# Patient Record
Sex: Male | Born: 1950 | Race: White | Hispanic: No | Smoking: Former smoker
Health system: Southern US, Community
[De-identification: ages and names within clinical notes are randomized; demographics above are authoritative.]

## PROBLEM LIST (undated history)

## (undated) DIAGNOSIS — R519 Headache, unspecified: Secondary | ICD-10-CM

## (undated) DIAGNOSIS — Z720 Tobacco use: Secondary | ICD-10-CM

## (undated) DIAGNOSIS — R7303 Prediabetes: Secondary | ICD-10-CM

## (undated) DIAGNOSIS — J189 Pneumonia, unspecified organism: Secondary | ICD-10-CM

## (undated) DIAGNOSIS — M1990 Unspecified osteoarthritis, unspecified site: Secondary | ICD-10-CM

## (undated) DIAGNOSIS — R0600 Dyspnea, unspecified: Secondary | ICD-10-CM

## (undated) DIAGNOSIS — C3491 Malignant neoplasm of unspecified part of right bronchus or lung: Secondary | ICD-10-CM

## (undated) DIAGNOSIS — R51 Headache: Secondary | ICD-10-CM

## (undated) DIAGNOSIS — S0990XA Unspecified injury of head, initial encounter: Secondary | ICD-10-CM

## (undated) DIAGNOSIS — R918 Other nonspecific abnormal finding of lung field: Secondary | ICD-10-CM

## (undated) DIAGNOSIS — J449 Chronic obstructive pulmonary disease, unspecified: Secondary | ICD-10-CM

## (undated) DIAGNOSIS — T8859XA Other complications of anesthesia, initial encounter: Secondary | ICD-10-CM

## (undated) DIAGNOSIS — N400 Enlarged prostate without lower urinary tract symptoms: Secondary | ICD-10-CM

## (undated) DIAGNOSIS — C801 Malignant (primary) neoplasm, unspecified: Secondary | ICD-10-CM

## (undated) DIAGNOSIS — I499 Cardiac arrhythmia, unspecified: Secondary | ICD-10-CM

## (undated) HISTORY — PX: ELBOW SURGERY: SHX618

## (undated) HISTORY — DX: Other nonspecific abnormal finding of lung field: R91.8

## (undated) HISTORY — PX: EYE SURGERY: SHX253

## (undated) HISTORY — PX: CARPAL TUNNEL RELEASE: SHX101

## (undated) HISTORY — PX: SKIN CANCER EXCISION: SHX779

## (undated) HISTORY — PX: HAND SURGERY: SHX662

## (undated) HISTORY — DX: Malignant neoplasm of unspecified part of right bronchus or lung: C34.91

## (undated) HISTORY — DX: Tobacco use: Z72.0

## (undated) HISTORY — PX: COLONOSCOPY: SHX174

## (undated) HISTORY — DX: Unspecified injury of head, initial encounter: S09.90XA

## (undated) HISTORY — PX: SKIN GRAFT: SHX250

---

## 2000-07-20 ENCOUNTER — Ambulatory Visit (HOSPITAL_COMMUNITY): Admission: RE | Admit: 2000-07-20 | Discharge: 2000-07-20 | Payer: Self-pay | Admitting: Gastroenterology

## 2000-07-20 ENCOUNTER — Encounter (INDEPENDENT_AMBULATORY_CARE_PROVIDER_SITE_OTHER): Payer: Self-pay | Admitting: *Deleted

## 2000-12-23 ENCOUNTER — Inpatient Hospital Stay (HOSPITAL_COMMUNITY): Admission: EM | Admit: 2000-12-23 | Discharge: 2000-12-26 | Payer: Self-pay | Admitting: *Deleted

## 2000-12-27 ENCOUNTER — Other Ambulatory Visit (HOSPITAL_COMMUNITY): Admission: RE | Admit: 2000-12-27 | Discharge: 2000-12-29 | Payer: Self-pay | Admitting: Psychiatry

## 2007-08-22 ENCOUNTER — Emergency Department (HOSPITAL_COMMUNITY): Admission: EM | Admit: 2007-08-22 | Discharge: 2007-08-23 | Payer: Self-pay | Admitting: Emergency Medicine

## 2009-02-23 ENCOUNTER — Emergency Department (HOSPITAL_COMMUNITY): Admission: EM | Admit: 2009-02-23 | Discharge: 2009-02-23 | Payer: Self-pay | Admitting: Emergency Medicine

## 2010-03-12 ENCOUNTER — Emergency Department (HOSPITAL_COMMUNITY): Admission: EM | Admit: 2010-03-12 | Discharge: 2010-03-12 | Payer: Self-pay | Admitting: Emergency Medicine

## 2010-09-16 LAB — URINALYSIS, ROUTINE W REFLEX MICROSCOPIC
Bilirubin Urine: NEGATIVE
Glucose, UA: NEGATIVE mg/dL
Hgb urine dipstick: NEGATIVE
Ketones, ur: NEGATIVE mg/dL
Leukocytes, UA: NEGATIVE
Nitrite: POSITIVE — AB
Protein, ur: NEGATIVE mg/dL
Specific Gravity, Urine: 1.011 (ref 1.005–1.030)
Urobilinogen, UA: 1 mg/dL (ref 0.0–1.0)
pH: 6.5 (ref 5.0–8.0)

## 2010-09-16 LAB — URINE MICROSCOPIC-ADD ON

## 2010-09-16 LAB — URINE CULTURE
Colony Count: 25000
Culture  Setup Time: 201109091516

## 2010-11-19 NOTE — Op Note (Signed)
[facility]  Patient:    [name], [name]                       MRN: [id number] Proc. Date: 07/20/00 Adm. Date:  [id number] Attending:  Orland Mustard CC:         Teena Irani. Arlyce Dice, M.D., [facility]   Operative Report  DATE OF BIRTH:  [birth year]  PROCEDURE:  Colonoscopy and polypectomy.  GASTROENTEROLOGIST:  Llana Aliment. Edwards, M.D.  MEDICATIONS:  Fentanyl 100 mcg, Versed 8 mg IV.  INDICATIONS:  Strong family history of colon cancer.  INSTRUMENT:  Adult Olympus video colonoscope.  DESCRIPTION OF PROCEDURE:  The procedure had been explained to the pat and consent obtained.  With the patient in the left lateral decubitus position, the adult video colonoscope was inserted and advanced under direct visualization.  The prep was excellent, and we were able to advance to the cecum without any difficulty.  The ileocecal valve and appendiceal orifice was seen.  The scope with withdrawn and the colon carefully examined on the way out.  The cecum, ascending colon, hepatic flexure, transverse colon, splenic flexure, descending and sigmoid colon were seen well upon removal.  In the mid sigmoid, a 0.5 cm polyp was removed with the snare and sucked through the scope.  The scope was withdrawn into the rectum.  There were two polyps in the rectum.  The more proximal polyp was 0.5 cm and was removed with the snare and sucked through the scope.  There was a 1 cm in the distal rectum which was also removed with the snare and sucked through the scope in fragments.  Both of these were placed in jar #2.  The scope was withdrawn.   The patient tolerated the procedure well.  He was maintained on low-flow oxygen on pulse oximetry throughout the procedure.  ASSESSMENT:  Multiple colon polyps removed.  PLAN:  We recommend repeat procedure in one year.  Routine post polypectomy instructions. DD:  07/20/00 TD:  07/21/00 Job: [id number]

## 2010-11-19 NOTE — H&P (Signed)
[facility]  Patient:    [name], [name]                       MRN: [id number] Adm. Date:  [id number] Disc. Date: [id number] Attending:  Carolanne Grumbling D                   Psychiatric Admission Assessment  INTRODUCTION:  The patient is a 60 year old white, separated male who is admitted after being "close to attempt suicide."  The preceding night after his wife refused reconcile with him, he went with a gun into the woods.  He wanted to kill himself using the gun.  Fortunately, he changed his mind. The patients son called the police, who brought patient to the emergency room.  The patient did not plan this suicide but he felt that he was recently at the end of his rope. Reported being depressed since Easter, when wife suddenly decided to leave him.  He felt that his life does not make any sense since her departure, lost all joy and drive.  He lost appetite with 30-pounds body weight lost.  Cannot sleep.  Reports racing thoughts.  Cannot stop thinking about wife, separation and the reason for this, trying to find logic in whole situation.  The patient lost interest in any pleasurable activities. 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 About months ago, he started treatment with Zoloft prescribed by his family physician in dose of 50 mg daily but did not experience an improvement on this medication.  The patient does not have any previous psychiatric history.  SOCIAL HISTORY:  He has good work record, most recently four years employed in [employer].  He felt that he had good marriage for past 27 years and cannot stand "surprised" by wifes departure.  He has two children, 52 and 7 years of age, who are both supportive.  The 61 year old still lives at home with the patient.  FAMILY HISTORY:  Negative for mental illness.  ALCOHOL/DRUG HISTORY:  The patient denies alcohol and drug history or  current use.  MEDICAL PROBLEMS:  The patient complains often of joint pain but takes over-the-counter medication and does not require any special intervention.  PHYSICAL EXAMINATION:  In the emergency room was normal.  ALLERGIES:  He does not have any drug allergies.  MENTAL STATUS EXAMINATION:  Medium-built, casually dressed, well-groomed white male with sad facial expression.  Denied hallucinations.  He was cooperative, pleasant, somehow restless and anxious during the interview.  Good eye contact.  Normal speech.  Mood was depressed.  Affect anxious and sad. Thoughts organized and goal directed.  At the time of examination, he denies suicidal ideation but reports worries.  He was seriously suicidal prior to admission.  No delusions.  No ideas of reference.  No signs of OCD.  Alert. Oriented x 3 with good memory, fair concentration.  Normal intelligence. Insight was fair but judgment was compromised considering patients recent behavior.  He sounded sincere and reliable.  DIAGNOSTIC IMPRESSION: Axis I:    Major depression, moderate to severe, single episode. Axis II:   No diagnosis. Axis III:  No diagnosis. Axis IV:   Psychosocial stressors severe (separation from wife after 27 years            of marriage and economic problems related to wifes departure). Axis V:    At the time of admission, global assessment of functioning 30;  maximum for past year estimated at 76.  PLAN:  The patient was able to promise safety and was placed on 15-minute observation.  We will start patient on Celexa and Zyprexa combination, adding Xanax for anxiety.  This combination will target depression, obsessive worries and racing thoughts as well as insomnia.  The patient also lost appetite which Zyprexa could be helpful with.  After discharge, will consider intensive outpatient treatment.  Caseworker will work upon removing firearms from patients household.  The patient agreed to this plan.  The side effects from medications and rationale behind their use were explained to him.  He agreed to the treatment. DD:  01/30/01 TD:  01/31/01 Job: [id number]

## 2011-09-09 ENCOUNTER — Emergency Department (HOSPITAL_COMMUNITY)
Admission: EM | Admit: 2011-09-09 | Discharge: 2011-09-09 | Disposition: A | Discharge status: Home / Self Care | Payer: Self-pay | Attending: Emergency Medicine | Admitting: Emergency Medicine

## 2011-09-09 ENCOUNTER — Encounter (HOSPITAL_COMMUNITY): Payer: Self-pay | Admitting: *Deleted

## 2011-09-09 ENCOUNTER — Emergency Department (HOSPITAL_COMMUNITY): Payer: Self-pay

## 2011-09-09 DIAGNOSIS — R059 Cough, unspecified: Secondary | ICD-10-CM | POA: Insufficient documentation

## 2011-09-09 DIAGNOSIS — R509 Fever, unspecified: Secondary | ICD-10-CM | POA: Insufficient documentation

## 2011-09-09 DIAGNOSIS — R05 Cough: Secondary | ICD-10-CM | POA: Insufficient documentation

## 2011-09-09 DIAGNOSIS — J4489 Other specified chronic obstructive pulmonary disease: Secondary | ICD-10-CM | POA: Insufficient documentation

## 2011-09-09 DIAGNOSIS — J40 Bronchitis, not specified as acute or chronic: Secondary | ICD-10-CM

## 2011-09-09 DIAGNOSIS — Z87891 Personal history of nicotine dependence: Secondary | ICD-10-CM | POA: Insufficient documentation

## 2011-09-09 DIAGNOSIS — J449 Chronic obstructive pulmonary disease, unspecified: Secondary | ICD-10-CM | POA: Insufficient documentation

## 2011-09-09 DIAGNOSIS — M129 Arthropathy, unspecified: Secondary | ICD-10-CM | POA: Insufficient documentation

## 2011-09-09 HISTORY — DX: Unspecified osteoarthritis, unspecified site: M19.90

## 2011-09-09 MED ORDER — AZITHROMYCIN 250 MG PO TABS: 250.0000 mg | ORAL_TABLET | Freq: Every day | ORAL | Status: DC

## 2011-09-09 MED ORDER — HYDROCODONE-ACETAMINOPHEN 7.5-500 MG/15ML PO SOLN: 15.0000 mL | Freq: Four times a day (QID) | ORAL | Status: AC | PRN

## 2011-09-09 NOTE — Discharge Instructions (Signed)
Bronchitis Bronchitis is the body's way of reacting to injury and/or infection (inflammation) of the bronchi. Bronchi are the air tubes that extend from the windpipe into the lungs. If the inflammation becomes severe, it may cause shortness of breath. CAUSES  Inflammation may be caused by:  A virus.   Germs (bacteria).   Dust.   Allergens.   Pollutants and many other irritants.  The cells lining the bronchial tree are covered with tiny hairs (cilia). These constantly beat upward, away from the lungs, toward the mouth. This keeps the lungs free of pollutants. When these cells become too irritated and are unable to do their job, mucus begins to develop. This causes the characteristic cough of bronchitis. The cough clears the lungs when the cilia are unable to do their job. Without either of these protective mechanisms, the mucus would settle in the lungs. Then you would develop pneumonia. Smoking is a common cause of bronchitis and can contribute to pneumonia. Stopping this habit is the single most important thing you can do to help yourself. TREATMENT   Your caregiver may prescribe an antibiotic if the cough is caused by bacteria. Also, medicines that open up your airways make it easier to breathe. Your caregiver may also recommend or prescribe an expectorant. It will loosen the mucus to be coughed up. Only take over-the-counter or prescription medicines for pain, discomfort, or fever as directed by your caregiver.   Removing whatever causes the problem (smoking, for example) is critical to preventing the problem from getting worse.   Cough suppressants may be prescribed for relief of cough symptoms.   Inhaled medicines may be prescribed to help with symptoms now and to help prevent problems from returning.   For those with recurrent (chronic) bronchitis, there may be a need for steroid medicines.  SEEK IMMEDIATE MEDICAL CARE IF:   During treatment, you develop more pus-like mucus  (purulent sputum).   You have a fever.   Your baby is older than 3 months with a rectal temperature of 102 F (38.9 C) or higher.   Your baby is 17 months old or younger with a rectal temperature of 100.4 F (38 C) or higher.   You become progressively more ill.   You have increased difficulty breathing, wheezing, or shortness of breath.  It is necessary to seek immediate medical care if you are elderly or sick from any other disease. MAKE SURE YOU:   Understand these instructions.   Will watch your condition.   Will get help right away if you are not doing well or get worse.  Document Released: 06/20/2005 Document Revised: 06/09/2011 Document Reviewed: 04/29/2008 [facility] Patient Information 2012 [place], [place].  Viral Syndrome You or your child has Viral Syndrome. It is the most common infection causing "colds" and infections in the nose, throat, sinuses, and breathing tubes. Sometimes the infection causes nausea, vomiting, or diarrhea. The germ that causes the infection is a virus. No antibiotic or other medicine will kill it. There are medicines that you can take to make you or your child more comfortable.  HOME CARE INSTRUCTIONS   Rest in bed until you start to feel better.   If you have diarrhea or vomiting, eat small amounts of crackers and toast. Soup is helpful.   Do not give aspirin or medicine that contains aspirin to children.   Only take over-the-counter or prescription medicines for pain, discomfort, or fever as directed by your caregiver.  SEEK IMMEDIATE MEDICAL CARE IF:   You or  your child has not improved within one week.   You or your child has pain that is not at least partially relieved by over-the-counter medicine.   Thick, colored mucus or blood is coughed up.   Discharge from the nose becomes thick yellow or green.   Diarrhea or vomiting gets worse.   There is any major change in your or your child's condition.   You or your child develops a  skin rash, stiff neck, severe headache, or are unable to hold down food or fluid.   You or your child has an oral temperature above 102 F (38.9 C), not controlled by medicine.   Your baby is older than 3 months with a rectal temperature of 102 F (38.9 C) or higher.   Your baby is 41 months old or younger with a rectal temperature of 100.4 F (38 C) or higher.  Document Released: 06/05/2006 Document Revised: 06/09/2011 Document Reviewed: 06/06/2007 [facility] Patient Information 2012 [place], [place].

## 2011-09-09 NOTE — ED Notes (Signed)
Pt reports cough/fever/aches since Monday. States "I think I have pneumonia". States fever this am 102.7.

## 2011-09-09 NOTE — ED Provider Notes (Signed)
History     CSN: [id number]  Arrival date & time 09/09/11  1224   First MD Initiated Contact with Patient 09/09/11 1305      Chief Complaint  Patient presents with  . Cough  . Fever    (Consider location/radiation/quality/duration/timing/severity/associated sxs/prior treatment) HPI  61 year old male presents with flulike symptoms. Patient states for the past 4-5 days he has been having fever of 102 at home, cough productive of white sputum, sneezing, runny nose, body aches, nausea, vomiting, and diarrhea. State vomiting and diarrhea are nonbloody. Denies any significant abdominal pain. The size, pleuritic chest pain he denies any other chest pain or shortness of breath. States that he feels very tired. He has not taken any medication for his symptoms with exception of some over-the-counter Alka-Seltzer Plus.  He denies dysuria or rash. He was a former smoker and has prior history of COPD. He has prior history of pneumonia and was worried that he may have recurrent pneumonia. He also states he works in a very cold environment and this worsened his symptoms.  Past Medical History  Diagnosis Date  . Arthritis     Past Surgical History  Procedure Date  . Hand surgery   . Elbow surgery     No family history on file.  History  Substance Use Topics  . Smoking status: Never Smoker   . Smokeless tobacco: Not on file  . Alcohol Use: No      Review of Systems  All other systems reviewed and are negative.    Allergies  Review of patient's allergies indicates no known allergies.  Home Medications   Current Outpatient Rx  Name Route Sig Dispense Refill  . ALKA-SELTZER PLUS COLD PO Oral Take 1 packet by mouth 2 (two) times daily as needed. For cold symptoms.      BP 106/63  Pulse 75  Temp 98.9 F (37.2 C)  Resp 20  SpO2 97%  Physical Exam  Nursing note and vitals reviewed. Constitutional: He appears well-developed and well-nourished. No distress.       Awake, alert,  nontoxic appearance  HENT:  Head: Atraumatic.  Right Ear: External ear normal.  Left Ear: External ear normal.  Mouth/Throat: Oropharynx is clear and moist. No oropharyngeal exudate.       Rhinorrhea.  Eyes: Conjunctivae are normal. Right eye exhibits no discharge. Left eye exhibits no discharge.  Neck: Normal range of motion. Neck supple.  Cardiovascular: Normal rate and regular rhythm.   Pulmonary/Chest: Effort normal. No respiratory distress. He has no wheezes. He has no rales. He exhibits tenderness.  Abdominal: Soft. He exhibits no distension. There is no tenderness. There is no rebound.  Musculoskeletal: Normal range of motion. He exhibits no tenderness.       ROM appears intact, no obvious focal weakness  Lymphadenopathy:    He has no cervical adenopathy.  Neurological: He is alert.  Skin: Skin is warm and dry. No rash noted.  Psychiatric: He has a normal mood and affect.    ED Course  Procedures (including critical care time)  Labs Reviewed - No data to display No results found.   No diagnosis found.  Results for orders placed during the hospital encounter of 03/12/10  URINALYSIS, ROUTINE W REFLEX MICROSCOPIC      Component Value Range   Color, Urine ORANGE BIOCHEMICALS MAY BE AFFECTED BY COLOR (*) YELLOW    APPearance CLEAR  CLEAR    Specific Gravity, Urine 1.011  1.005 - 1.030  pH 6.5  5.0 - 8.0    Glucose, UA NEGATIVE  NEGATIVE (mg/dL)   Hgb urine dipstick NEGATIVE  NEGATIVE    Bilirubin Urine NEGATIVE  NEGATIVE    Ketones, ur NEGATIVE  NEGATIVE (mg/dL)   Protein, ur NEGATIVE  NEGATIVE (mg/dL)   Urobilinogen, UA 1.0  0.0 - 1.0 (mg/dL)   Nitrite POSITIVE (*) NEGATIVE    Leukocytes, UA NEGATIVE  NEGATIVE   URINE MICROSCOPIC-ADD ON      Component Value Range   Squamous Epithelial / LPF RARE  RARE    WBC, UA 0-2  <3 (WBC/hpf)   RBC / HPF 0-2  <3 (RBC/hpf)   Bacteria, UA RARE CHECKED  RARE   URINE CULTURE      Component Value Range   Specimen Description  URINE, RANDOM     Special Requests NONE     Culture  Setup Time [id number]     Colony Count 25,000 COLONIES/ML     Culture ESCHERICHIA COLI     Report Status 03/14/2010 FINAL     Organism ID, Bacteria ESCHERICHIA COLI     Dg Chest 2 View  09/09/2011  *RADIOLOGY REPORT*  Clinical Data: Cough, congestion and fever.  CHEST - 2 VIEW  Comparison: Chest x-ray 08/23/2007.  Findings: Lungs appear hyperexpanded with flattening of the hemidiaphragms, increased retrosternal air space and pruning of the pulmonary vasculature in the periphery, suggestive of underlying COPD.  Mild thickening of the central airways (unchanged).  No focal airspace consolidation.  No pleural effusions.  No evidence of edema.  Heart size and mediastinal contours are within normal limits.  IMPRESSION: Chronic changes of COPD redemonstrated, as above, without radiographic evidence of acute cardiopulmonary disease.  Original Report Authenticated By: Florencia Reasons, M.D.      MDM  Patient symptoms is most suggestive of upper respiratory infection. His chest x-ray shows no evidence of pneumonia. He does have prior history of COPD. Due to the chronicity of his symptoms, patient will be prescribed azithromycin and cough medication. Work note given. Patient states he's able to tolerate by mouth right now. He is afebrile here in  ED.        Fayrene Helper, PA-C 09/09/11 1407

## 2011-09-09 NOTE — ED Provider Notes (Signed)
Medical screening examination/treatment/procedure(s) were performed by non-physician practitioner and as supervising physician I was immediately available for consultation/collaboration.   Loren Racer, MD 09/09/11 [phone]

## 2012-10-24 ENCOUNTER — Other Ambulatory Visit: Payer: Self-pay | Admitting: Internal Medicine

## 2012-10-24 DIAGNOSIS — T148XXA Other injury of unspecified body region, initial encounter: Secondary | ICD-10-CM

## 2012-10-25 ENCOUNTER — Ambulatory Visit
Admission: RE | Admit: 2012-10-25 | Discharge: 2012-10-25 | Disposition: A | Discharge status: Home / Self Care | Payer: Worker's Compensation | Source: Ambulatory Visit | Attending: Internal Medicine | Admitting: Internal Medicine

## 2012-10-25 DIAGNOSIS — T148XXA Other injury of unspecified body region, initial encounter: Secondary | ICD-10-CM

## 2012-11-16 ENCOUNTER — Other Ambulatory Visit: Payer: Self-pay | Admitting: Orthopedic Surgery

## 2012-11-16 DIAGNOSIS — M25561 Pain in right knee: Secondary | ICD-10-CM

## 2012-11-23 ENCOUNTER — Ambulatory Visit
Admission: RE | Admit: 2012-11-23 | Discharge: 2012-11-23 | Disposition: A | Discharge status: Home / Self Care | Payer: Worker's Compensation | Source: Ambulatory Visit | Attending: Orthopedic Surgery | Admitting: Orthopedic Surgery

## 2012-11-23 DIAGNOSIS — M25561 Pain in right knee: Secondary | ICD-10-CM

## 2016-07-05 ENCOUNTER — Encounter: Payer: Self-pay | Admitting: Cardiothoracic Surgery

## 2016-07-05 ENCOUNTER — Other Ambulatory Visit: Payer: Self-pay | Admitting: *Deleted

## 2016-07-05 ENCOUNTER — Institutional Professional Consult (permissible substitution) (INDEPENDENT_AMBULATORY_CARE_PROVIDER_SITE_OTHER): Payer: 59 | Admitting: Cardiothoracic Surgery

## 2016-07-05 ENCOUNTER — Encounter: Payer: Self-pay | Admitting: Physician Assistant

## 2016-07-05 VITALS — BP 131/75 | HR 77 | Resp 16 | Ht 72.0 in | Wt 161.0 lb

## 2016-07-05 DIAGNOSIS — D381 Neoplasm of uncertain behavior of trachea, bronchus and lung: Secondary | ICD-10-CM

## 2016-07-05 NOTE — Progress Notes (Signed)
[address]       [place] [address]             [phone]                    [name] [place] Medical Record #[id number] Date of Birth: [birth year]  Referring: [name], MD Primary Care: No primary care provider on file.  Chief Complaint:    Chief Complaint  Patient presents with  . Lung Lesion    [facility]..CT CHEST 06/08/16 @ [facility], PET @ [facility] 06/22/16, PFT 06/15/16    History of Present Illness:    [name] 66 y.o. male is seen in the office  today for right lung mass. He is long term smoker1-2  ppd for 40 years. He presented to urgent care with increased cough and fever. He was treated for copd excerebration and pneumonia. He noted improvement with antibiotics, inhalers and steroids.Follow up  up chest xray showed right lung mass. Follow ct and PET scan done in [place] ( under name [name]) suggestive of 2.5 cm right upper lobe hypermetabolic   mass highly suggestive of primary lung cancer. He is referred for consideration of surgical resection.         Current Activity/ Functional Status:  Patient is independent with mobility/ambulation, transfers, ADL's, IADL's.   Zubrod Score: At the time of surgery this patient's most appropriate activity status/level should be described as: '[]'$     0    Normal activity, no symptoms '[x]'$     1    Restricted in physical strenuous activity but ambulatory, able to do out light work '[]'$     2    Ambulatory and capable of self care, unable to do work activities, up and about               >50 % of waking hours                              '[]'$     3    Only limited self care, in bed greater than 50% of waking hours '[]'$     4    Completely disabled, no self care, confined to bed or chair '[]'$     5    Moribund   Past Medical History:  Diagnosis Date  . Arthritis     Past Surgical History:  Procedure Laterality Date  . ELBOW SURGERY    . HAND SURGERY    head injury yars ago with cranial plate in  place.  Family history: Father died prostate cancer age 52 Mother died age 63 brain aneurysm One bother 2 sisters  diabeties  Social History   Social History  . Marital status: Married    Spouse name: N/A  . Number of children: N/A  . Years of education: N/A   Occupational History  . [employer] manager at [employer] , works full time   Social History Main Topics  . Smoking status: Current Every Day Smoker    Packs/day: 1.00    Years: 50.00    Types: Cigarettes  . Smokeless tobacco: Never Used  . Alcohol use No  . Drug use: No  . Sexual activity: Not on file     History  Smoking Status  . Current Every Day Smoker  . Packs/day: 1.00  . Years: 50.00  . Types: Cigarettes  Smokeless Tobacco  .  Never Used    History  Alcohol Use No     No Known Allergies  Current Outpatient Prescriptions  Medication Sig Dispense Refill  . albuterol (PROVENTIL HFA;VENTOLIN HFA) 108 (90 Base) MCG/ACT inhaler Inhale 2 puffs into the lungs every 6 (six) hours as needed for wheezing or shortness of breath.    . budesonide-formoterol (SYMBICORT) 80-4.5 MCG/ACT inhaler Inhale 2 puffs into the lungs 2 (two) times daily.    Marland Kitchen tiotropium (SPIRIVA) 18 MCG inhalation capsule Place 18 mcg into inhaler and inhale 2 (two) times daily. TWO PUFFS     No current facility-administered medications for this visit.       Review of Systems:     Cardiac Review of Systems: Y or N  Chest Pain [ n   ]  Resting SOB [ n  ] Exertional SOB  [ y ]  Orthopnea [ n ]   Pedal Edema [ n  ]    Palpitations [n  ] Syncope  [n  ]   Presyncope [ n  ]  General Review of Systems: [Y] = yes [  ]=no Constitional: recent weight change [ n ];  Wt loss over the last 3 months [   ] anorexia [  ]; fatigue [  ]; nausea [  ]; night sweats [  ]; fever [ n ]; or chills [n  ];          Dental: poor dentition[y dentures  ]; Last Dentist visit:   Eye : blurred vision [  ]; diplopia [   ]; vision changes [  ];  Amaurosis fugax[   ]; Resp: cough Blue.Reese  ];  wheezing[y  ];  hemoptysis[n  ]; shortness of breath[ n ]; paroxysmal nocturnal dyspnea[ n ]; dyspnea on exertion[y  ]; or orthopnea[  ];  GI:  gallstones[  ], vomiting[  ];  dysphagia[  ]; melena[  ];  hematochezia [  ]; heartburn[  ];   Hx of  Colonoscopy[ n ]; GU: kidney stones [  ]; hematuria[  ];   dysuria [  ];  nocturia[  ];  history of     obstruction [  ]; urinary frequency [  ]             Skin: rash, swelling[  ];, hair loss[  ];  peripheral edema[  ];  or itching[  ]; Musculosketetal: myalgias[  ];  joint swelling[  ];  joint erythema[  ];  joint pain[  ];  back pain[  ];  Heme/Lymph: bruising[  ];  bleeding[  ];  anemia[  ];  Neuro: TIA[  ];  headaches[  ];  stroke[  ];  vertigo[  ];  seizures[  ];   paresthesias[  ];  difficulty walking[  ];  Psych:depression[  ]; anxiety[  ];  Endocrine: diabetes[N  ];  thyroid dysfunction[  ];  Immunizations: Flu up to date [ N ]; Pneumococcal up to date [ N ];  Other:  Physical Exam: BP 131/75 (BP Location: Left Arm, Patient Position: Sitting, Cuff Size: Normal)   Pulse 77   Resp 16   Ht 6' (1.829 m)   Wt 161 lb (73 kg)   SpO2 93% Comment: ON RA  BMI 21.84 kg/m   PHYSICAL EXAMINATION: General appearance: alert, cooperative, appears older than stated age and no distress Head: Normocephalic, without obvious abnormality, atraumatic Neck: no adenopathy, no carotid bruit, no JVD, supple, symmetrical, trachea midline and thyroid not enlarged, symmetric, no  tenderness/mass/nodules Lymph nodes: Cervical, supraclavicular, and axillary nodes normal. Resp: clear to auscultation bilaterally Back: symmetric, no curvature. ROM normal. No CVA tenderness. Cardio: regular rate and rhythm, S1, S2 normal, no murmur, click, rub or gallop GI: soft, non-tender; bowel sounds normal; no masses,  no organomegaly Extremities: extremities normal, atraumatic, no cyanosis or edema and clubbing of fingers with nicotine stains Neurologic:  Grossly normal  Diagnostic Studies & Laboratory data:     Recent Radiology Findings:   see pet and ct of chest in pac system under name [name] 2.4 cm right upper lobe mass with evidence of emphysemic pulmonary changes  I have independently reviewed the above radiology studies  and reviewed the findings with the patient.   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    I have reviewed films with patient and likely dx of lung  cancer. Will proceed with CPX exercise  testing to better stratify surgical risk.  With age and extensive smoking history will obtain prop cardiology clearance.   I  spent 40 minutes counseling the patient face to face and 50% or more the  time was spent in counseling and coordination of care. The total time spent in the appointment was 60 minutes.  [name] MD      [address], [address] Office [phone]   Beeper [phone]  07/05/2016 9:15 PM

## 2016-07-06 ENCOUNTER — Other Ambulatory Visit: Payer: Self-pay | Admitting: *Deleted

## 2016-07-06 DIAGNOSIS — J449 Chronic obstructive pulmonary disease, unspecified: Secondary | ICD-10-CM

## 2016-07-06 DIAGNOSIS — Z01818 Encounter for other preprocedural examination: Secondary | ICD-10-CM

## 2016-07-07 ENCOUNTER — Encounter: Payer: Self-pay | Admitting: Physician Assistant

## 2016-07-07 DIAGNOSIS — M1990 Unspecified osteoarthritis, unspecified site: Secondary | ICD-10-CM | POA: Insufficient documentation

## 2016-07-07 DIAGNOSIS — Z720 Tobacco use: Secondary | ICD-10-CM | POA: Insufficient documentation

## 2016-07-07 DIAGNOSIS — C3490 Malignant neoplasm of unspecified part of unspecified bronchus or lung: Secondary | ICD-10-CM | POA: Insufficient documentation

## 2016-07-07 NOTE — Progress Notes (Signed)
Cardiology Office Note    Date:  07/08/2016  ID:  [name], DOB 1950-08-31, MRN [id number] PCP:  No primary care provider on file.  Cardiologist:  New, reviewed with Dr. Angelena Form   Chief Complaint: pre-operative evaluation - new patient visit  History of Present Illness:  [name] is a 66 y.o. male with history of tobacco abuse, arthritis, recently diagnosed lung mass who presents for pre-operative cardiovascular evaluation. Per review of chart, he was evaluated by urgent care for cough and fever and was treated for COPD exacerbration and pneumonia. He noted improvement with antibiotics, inhalers and steroids. Follow up chest x-ray showed right lung mass. Follow CT and PET scan done in [place] (under name [name]) was suggestive of 2.5 cm right upper lobe hypermetabolic mass highly suggestive of primary lung cancer. I cannot pull up this data. Dr. Servando Snare plans to proceed with CPX on 07/13/15 to better stratify surgical risk and recommended pre-op cardiology clearance.   The patient presents to clinic today with his wife. He denies any cardiac history. He has smoked for 45 years and is working with a Engineer, maintenance to cut down. He has noticed exertional dyspnea for 1 month. He has not had any chest pain. 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 This was deemed due to fluctuating blood pressure. He does not follow with a PCP so lipid status is unknown. Father had CABG in his 78s. He expresses significant frustration over having to see multiple doctors and testing. He states, "why can't they just take it out and be done with it?"   Past Medical History:  Diagnosis Date  . Arthritis   . Head injury    a. remotely with cranial plate in place.  . Lung mass    a. @ [facility] - 2.5 cm right upper lobe hypermetabolic mass highly suggestive of primary lung cancer.   . Tobacco abuse     Past Surgical History:    Procedure Laterality Date  . ELBOW SURGERY    . HAND SURGERY      Current Medications: Current Outpatient Prescriptions  Medication Sig Dispense Refill  . albuterol (PROVENTIL HFA;VENTOLIN HFA) 108 (90 Base) MCG/ACT inhaler Inhale 2 puffs into the lungs every 6 (six) hours as needed for wheezing or shortness of breath.    . budesonide-formoterol (SYMBICORT) 80-4.5 MCG/ACT inhaler Inhale 2 puffs into the lungs 2 (two) times daily.    Marland Kitchen tiotropium (SPIRIVA) 18 MCG inhalation capsule Place 18 mcg into inhaler and inhale 2 (two) times daily. TWO PUFFS     No current facility-administered medications for this visit.      Allergies:   Patient has no known allergies.   Social History   Social History  . Marital status: Married    Spouse name: N/A  . Number of children: N/A  . Years of education: N/A   Social History Main Topics  . Smoking status: Current Every Day Smoker    Packs/day: 1.00    Years: 50.00    Types: Cigarettes  . Smokeless tobacco: Never Used  . Alcohol use No  . Drug use: No  . Sexual activity: Not Asked   Other Topics Concern  . None   Social History Narrative  . None     Family History:  The patient's family history includes Aneurysm in his mother; Colon cancer in his father; Heart disease in his  father.   ROS:   Please see the history of present illness. Recent URI sx, improved. No fever. All other systems are reviewed and otherwise negative.    PHYSICAL EXAM:   VS:  BP 110/68   Pulse (!) 55   Ht 6' (1.829 m)   Wt 160 lb (72.6 kg)   BMI 21.70 kg/m   BMI: Body mass index is 21.7 kg/m. GEN: Well nourished, well developed WM, in no acute distress  HEENT: normocephalic, atraumatic Neck: no JVD, carotid bruits, or masses Cardiac: RRR; no murmurs, rubs, or gallops, no edema  Respiratory:  clear to auscultation bilaterally, normal work of breathing GI: soft, nontender, nondistended, + BS MS: no deformity or atrophy  Skin: warm and dry, no  rash Neuro:  Alert and Oriented x 3, Strength and sensation are intact, follows commands Psych: euthymic mood, full affect  Wt Readings from Last 3 Encounters:  07/08/16 160 lb (72.6 kg)  07/05/16 161 lb (73 kg)      Studies/Labs Reviewed:   EKG:  EKG was ordered today and personally reviewed by me and demonstrates sinus bradycardia 55bpm, nonspecific St-T changes with upsloped ST II, III, avF, V4-V6, TWI avL. No prior to compare to.   Recent Labs: No results found for requested labs within last 8760 hours.   Lipid Panel No results found for: CHOL, TRIG, HDL, CHOLHDL, VLDL, LDLCALC, LDLDIRECT  Additional studies/ records that were reviewed today include: Summarized above.    ASSESSMENT & PLAN:   The patient was discussed with Dr. Angelena Form and the plan was formulated per our discussion, given that this is a new patient to our clinic.  1. Pre-operative CV evaluation - cardiac risk factors include age, male, tobacco abuse and family history of CAD. He has had DOE x 1 month. He already has CPX planned for Tuesday 07/12/16. He is very wary of his ability to walk very long on the treadmill given his dyspnea and arthritis. I reviewed the case with Dr. Angelena Form as well as Dr. Haroldine Laws. We will f/u information from the CPX, but would also recommend Lexiscan nuclear stress test for ischemic evaluation The CPX follows a different treadmill protocol and may not necessarily provide adequate ischemic assessment, but will provide good prognostic information about his functional status pre-opveratively. The patient has history of extensive smoking but is not currently wheezing and has good air movement. He will be asked to bring his inhalers to his [place] appointment. Will also check screening labs including lipids. He has not eaten yet this morning. 2. Lung mass - patient states further plans (surgery vs radiation) are dependent on CPX and further workup by [facility]. Has follow-up 07/14/16 with Dr.  Servando Snare. The patient and his wife express significant frustration over having to be assessed prior to surgery. I spent a lot of time trying to explain rationale in the stepwise approach to his disease. I expressed my empathy towards a very overwhelming situation, especially from someone who did not previously regularly seek medical care. 3. Tobacco abuse - counseled on importance of cessation.  4. Arthritis - chronic problem, may impact his ability to exercise by CPX.   Disposition: Will tentatively arrange f/u with Dr. Camillia Herter care team 1 week after stress test. The patient feels very stressed with all the constant doctors' appointments so if his nuc is normal, we may only need to see back in follow-up once at a later date.   Medication Adjustments/Labs and Tests Ordered: Current medicines are reviewed at length with  the patient today.  Concerns regarding medicines are outlined above. Medication changes, Labs and Tests ordered today are summarized above and listed in the Patient Instructions accessible in Encounters.   Raechel Ache PA-C  07/08/2016 8:19 AM    [address], [place], [place]  [address] Phone: [phone]; Fax: [phone]

## 2016-07-08 ENCOUNTER — Encounter: Payer: Self-pay | Admitting: *Deleted

## 2016-07-08 ENCOUNTER — Encounter: Payer: Self-pay | Admitting: Physician Assistant

## 2016-07-08 ENCOUNTER — Ambulatory Visit (INDEPENDENT_AMBULATORY_CARE_PROVIDER_SITE_OTHER): Payer: Managed Care, Other (non HMO) | Admitting: Physician Assistant

## 2016-07-08 VITALS — BP 110/68 | HR 55 | Ht 72.0 in | Wt 160.0 lb

## 2016-07-08 DIAGNOSIS — R918 Other nonspecific abnormal finding of lung field: Secondary | ICD-10-CM | POA: Diagnosis not present

## 2016-07-08 DIAGNOSIS — M1990 Unspecified osteoarthritis, unspecified site: Secondary | ICD-10-CM

## 2016-07-08 DIAGNOSIS — Z01810 Encounter for preprocedural cardiovascular examination: Secondary | ICD-10-CM

## 2016-07-08 DIAGNOSIS — Z720 Tobacco use: Secondary | ICD-10-CM | POA: Diagnosis not present

## 2016-07-08 NOTE — Patient Instructions (Addendum)
Medication Instructions:  Your physician recommends that you continue on your current medications as directed. Please refer to the Current Medication list given to you today.    Labwork: Lipid panel/CBCd/CMET today  Testing/Procedures: Your physician has requested that you have a lexiscan myoview. For further information please visit [email or web address]. Please follow instruction sheet, as given.  PLEASE SCHEDULE FOR WED 07/13/16  Follow-Up: Your physician recommends that you schedule a follow-up appointment in: 1 week after myoview with PA/NP [facility] care team)        If you need a refill on your cardiac medications before your next appointment, please call your pharmacy.

## 2016-07-11 ENCOUNTER — Telehealth (HOSPITAL_COMMUNITY): Payer: Self-pay | Admitting: *Deleted

## 2016-07-11 LAB — COMPREHENSIVE METABOLIC PANEL

## 2016-07-11 LAB — CBC WITH DIFFERENTIAL/PLATELET
BASOS: 1 %
Basophils Absolute: 0.1 10*3/uL (ref 0.0–0.2)
EOS (ABSOLUTE): 0.1 10*3/uL (ref 0.0–0.4)
EOS: 1 %
HEMATOCRIT: 46.3 % (ref 37.5–51.0)
Hemoglobin: 15.7 g/dL (ref 13.0–17.7)
IMMATURE GRANS (ABS): 0 10*3/uL (ref 0.0–0.1)
IMMATURE GRANULOCYTES: 1 %
LYMPHS: 18 %
Lymphocytes Absolute: 1.6 10*3/uL (ref 0.7–3.1)
MCH: 31.2 pg (ref 26.6–33.0)
MCHC: 33.9 g/dL (ref 31.5–35.7)
MCV: 92 fL (ref 79–97)
MONOCYTES: 8 %
Monocytes Absolute: 0.7 10*3/uL (ref 0.1–0.9)
NEUTROS PCT: 71 %
Neutrophils Absolute: 6.2 10*3/uL (ref 1.4–7.0)
Platelets: 194 10*3/uL (ref 150–379)
RBC: 5.04 x10E6/uL (ref 4.14–5.80)
RDW: 14.8 % (ref 12.3–15.4)
WBC: 8.7 10*3/uL (ref 3.4–10.8)

## 2016-07-11 LAB — LIPID PANEL

## 2016-07-11 NOTE — Telephone Encounter (Signed)
Patient given detailed instructions per Myocardial Perfusion Study Information Sheet for the test on 07/13/16 at 0715. Patient notified to arrive 15 minutes early and that it is imperative to arrive on time for appointment to keep from having the test rescheduled.  If you need to cancel or reschedule your appointment, please call the office within 24 hours of your appointment. Failure to do so may result in a cancellation of your appointment, and a $50 no show fee. Patient verbalized understanding.[name], Ranae Palms

## 2016-07-12 ENCOUNTER — Other Ambulatory Visit: Payer: Self-pay | Admitting: *Deleted

## 2016-07-12 ENCOUNTER — Other Ambulatory Visit: Payer: 59 | Admitting: *Deleted

## 2016-07-12 ENCOUNTER — Other Ambulatory Visit (HOSPITAL_COMMUNITY): Payer: Self-pay | Admitting: *Deleted

## 2016-07-12 ENCOUNTER — Ambulatory Visit (HOSPITAL_COMMUNITY): Payer: 59 | Attending: Cardiology

## 2016-07-12 DIAGNOSIS — R918 Other nonspecific abnormal finding of lung field: Secondary | ICD-10-CM | POA: Insufficient documentation

## 2016-07-12 DIAGNOSIS — R942 Abnormal results of pulmonary function studies: Secondary | ICD-10-CM | POA: Insufficient documentation

## 2016-07-12 LAB — COMPREHENSIVE METABOLIC PANEL
A/G RATIO: 1.8 (ref 1.2–2.2)
ALK PHOS: 104 IU/L (ref 39–117)
ALT: 9 IU/L (ref 0–44)
AST: 14 IU/L (ref 0–40)
Albumin: 4.4 g/dL (ref 3.6–4.8)
BILIRUBIN TOTAL: 0.2 mg/dL (ref 0.0–1.2)
BUN/Creatinine Ratio: 9 — ABNORMAL LOW (ref 10–24)
BUN: 8 mg/dL (ref 8–27)
CALCIUM: 9.7 mg/dL (ref 8.6–10.2)
CHLORIDE: 100 mmol/L (ref 96–106)
CO2: 25 mmol/L (ref 18–29)
Creatinine, Ser: 0.9 mg/dL (ref 0.76–1.27)
GFR calc Af Amer: 103 mL/min/{1.73_m2} (ref >59)
GFR, EST NON AFRICAN AMERICAN: 89 mL/min/{1.73_m2} (ref >59)
Globulin, Total: 2.5 g/dL (ref 1.5–4.5)
Glucose: 94 mg/dL (ref 65–99)
POTASSIUM: 4.9 mmol/L (ref 3.5–5.2)
Sodium: 140 mmol/L (ref 134–144)
Total Protein: 6.9 g/dL (ref 6.0–8.5)

## 2016-07-12 LAB — LIPID PANEL
CHOLESTEROL TOTAL: 243 mg/dL — AB (ref 100–199)
Chol/HDL Ratio: 3.2 ratio units (ref 0.0–5.0)
HDL: 75 mg/dL (ref >39)
LDL Calculated: 148 mg/dL — ABNORMAL HIGH (ref 0–99)
TRIGLYCERIDES: 102 mg/dL (ref 0–149)
VLDL Cholesterol Cal: 20 mg/dL (ref 5–40)

## 2016-07-13 ENCOUNTER — Ambulatory Visit (HOSPITAL_COMMUNITY): Payer: 59 | Attending: Cardiovascular Disease

## 2016-07-13 DIAGNOSIS — Z01810 Encounter for preprocedural cardiovascular examination: Secondary | ICD-10-CM | POA: Diagnosis not present

## 2016-07-13 DIAGNOSIS — R918 Other nonspecific abnormal finding of lung field: Secondary | ICD-10-CM | POA: Diagnosis not present

## 2016-07-13 DIAGNOSIS — M1990 Unspecified osteoarthritis, unspecified site: Secondary | ICD-10-CM

## 2016-07-13 DIAGNOSIS — R0609 Other forms of dyspnea: Secondary | ICD-10-CM | POA: Insufficient documentation

## 2016-07-13 DIAGNOSIS — Z720 Tobacco use: Secondary | ICD-10-CM

## 2016-07-13 DIAGNOSIS — Z8249 Family history of ischemic heart disease and other diseases of the circulatory system: Secondary | ICD-10-CM | POA: Diagnosis not present

## 2016-07-13 LAB — MYOCARDIAL PERFUSION IMAGING
CHL CUP NUCLEAR SDS: 0
CHL CUP NUCLEAR SRS: 2
CHL CUP NUCLEAR SSS: 2
CHL CUP RESTING HR STRESS: 54 {beats}/min
LHR: 0.29
LV dias vol: 113 mL (ref 62–150)
LV sys vol: 49 mL
NUC STRESS TID: 0.95
Peak HR: 78 {beats}/min

## 2016-07-13 MED ORDER — REGADENOSON 0.4 MG/5ML IV SOLN
0.4000 mg | Freq: Once | INTRAVENOUS | Status: AC
  Administered 2016-07-13: 0.4 mg via INTRAVENOUS

## 2016-07-13 MED ORDER — TECHNETIUM TC 99M TETROFOSMIN IV KIT
10.7000 | PACK | Freq: Once | INTRAVENOUS | Status: AC | PRN
  Administered 2016-07-13: 10.7 via INTRAVENOUS
  Filled 2016-07-13: qty 11

## 2016-07-13 MED ORDER — TECHNETIUM TC 99M TETROFOSMIN IV KIT
32.8000 | PACK | Freq: Once | INTRAVENOUS | Status: AC | PRN
  Administered 2016-07-13: 32.8 via INTRAVENOUS
  Filled 2016-07-13: qty 33

## 2016-07-14 ENCOUNTER — Other Ambulatory Visit: Payer: Self-pay | Admitting: *Deleted

## 2016-07-14 ENCOUNTER — Encounter: Payer: Self-pay | Admitting: Cardiothoracic Surgery

## 2016-07-14 ENCOUNTER — Ambulatory Visit (INDEPENDENT_AMBULATORY_CARE_PROVIDER_SITE_OTHER): Payer: 59 | Admitting: Cardiothoracic Surgery

## 2016-07-14 VITALS — BP 119/76 | HR 65 | Resp 20 | Ht 72.0 in | Wt 160.0 lb

## 2016-07-14 DIAGNOSIS — D381 Neoplasm of uncertain behavior of trachea, bronchus and lung: Secondary | ICD-10-CM

## 2016-07-14 DIAGNOSIS — R911 Solitary pulmonary nodule: Secondary | ICD-10-CM

## 2016-07-14 NOTE — Patient Instructions (Signed)
Lung Cancer Lung cancer occurs when abnormal cells in the lung grow out of control and form a mass (tumor). There are several types of lung cancer. The two most common types are:  Non-small cell. In this type of lung cancer, abnormal cells are larger and grow more slowly than those of small cell lung cancer.  Small cell. In this type of lung cancer, abnormal cells are smaller than those of non-small cell lung cancer. Small cell lung cancer gets worse faster than non-small cell lung cancer.  What are the causes? The leading cause of lung cancer is smoking tobacco. The second leading cause is radon exposure. What increases the risk?  Smoking tobacco.  Exposure to secondhand tobacco smoke.  Exposure to radon gas.  Exposure to asbestos.  Exposure to arsenic in drinking water.  Air pollution.  Family or personal history of lung cancer.  Lung radiation therapy.  Being older than 65 years. What are the signs or symptoms? In the early stages, symptoms may not be present. As the cancer progresses, symptoms may include:  A lasting cough, possibly with blood.  Fatigue.  Unexplained weight loss.  Shortness of breath.  Wheezing.  Chest pain.  Loss of appetite.  Symptoms of advanced lung cancer include:  Hoarseness.  Bone or joint pain.  Weakness.  Nail problems.  Face or arm swelling.  Paralysis of the face.  Drooping eyelids.  How is this diagnosed? Lung cancer can be identified with a physical exam and with tests such as:  A chest X-ray.  A CT scan.  Blood tests.  A biopsy.  After a diagnosis is made, you will have more tests to determine the stage of the cancer. The stages of non-small cell lung cancer are:  Stage 0, also called carcinoma in situ. At this stage, abnormal cells are found in the inner lining of your lung or lungs.  Stage I. At this stage, abnormal cells have grown into a tumor that is no larger than 5 cm across. The cancer has entered  the deeper lung tissue but has not yet entered the lymph nodes or other parts of the body.  Stage II. At this stage, the tumor is 7 cm across or smaller and has entered nearby lymph nodes. Or, the tumor is 5 cm across or smaller and has invaded surrounding tissue but is not found in nearby lymph nodes. There may be more than one tumor present.  Stage III. At this stage, the tumor may be any size. There may be more than one tumor in the lungs. The cancer cells have spread to the lymph nodes and possibly to other organs.  Stage IV. At this stage, there are tumors in both lungs and the cancer has spread to other areas of the body.  The stages of small cell lung cancer are:  Limited. At this stage, the cancer is found only on one side of the chest.  Extensive. At this stage, the cancer is in the lungs and in tissues on the other side of the chest. The cancer has spread to other organs or is found in the fluid between the layers of your lungs.  How is this treated? Depending on the type and stage of your lung cancer, you may be treated with:  Surgery. This is done to remove a tumor.  Radiation therapy. This treatment destroys cancer cells using X-rays or other types of radiation.  Chemotherapy. This treatment uses medicines to destroy cancer cells.  Targeted therapy. This treatment   instead of all cells as other therapies do. You may also have a combination of treatments. Follow these instructions at home:  Do not use any tobacco products. This includes cigarettes, chewing tobacco, and electronic cigarettes. If you need help quitting, ask your health care provider.  Take medicines only as directed by your health care provider.  Eat a healthy diet. Work with a dietitian to make sure you are getting the nutrition you need.  Consider joining a support group or seeking counseling to help you cope with the stress of having lung cancer.  Let your cancer specialist  (oncologist) know if you are admitted to the hospital.  Keep all follow-up visits as directed by your health care provider. This is important. Contact a health care provider if:  You lose weight without trying.  You have a persistent cough and wheezing.  You feel short of breath.  You tire easily.  You experience bone or joint pain.  You have difficulty swallowing.  You feel hoarse or notice your voice changing.  Your pain medicine is not helping. Get help right away if:  You cough up blood.  You have new breathing problems.  You develop chest pain.  You develop swelling in:  One or both ankles or legs.  Your face, neck, or arms.  You are confused.  You experience paralysis in your face or a drooping eyelid. This information is not intended to replace advice given to you by your health care provider. Make sure you discuss any questions you have with your health care provider. Document Released: 09/26/2000 Document Revised: 11/26/2015 Document Reviewed: 10/24/2013 Elsevier Interactive Patient Education  2017 [place].   Lung Resection Introduction A lung resection is a procedure to remove part or all of a lung. When an entire lung is removed, the procedure is called a pneumonectomy. When only part of a lung is removed, the procedure is called a lobectomy. A lung resection is typically done to get rid of a tumor or cancer, but it may be done to treat other conditions. This procedure can help relieve some or all of your symptoms and can also help keep the problem from getting worse. Lung resection may provide the best chance for curing your disease. However, the procedure may not necessarily cure lung cancer if that is the problem. Tell a health care provider about:  Any allergies you have.  All medicines you are taking, including vitamins, herbs, eye drops, creams, and over-the-counter medicines.  Any problems you or family members have had with anesthetic  medicines.  Any blood disorders you have.  Any surgeries you have had.  Any medical conditions you have. What are the risks? Generally, lung resection is a safe procedure. However, problems can occur and include:  Excessive bleeding.  Infection.  Inability to breathe without a ventilator.  Persistent shortness of breath.  Heart problems, including abnormal rhythms and a risk of heart attack or heart failure.  Blood clots.  Injury to a blood vessel.  Injury to a nerve.  Failure to heal properly.  Stroke.  Bronchopleural fistula. This is a small hole between one of the main breathing tubes (bronchus) and the lining of the lungs. This is rare.  Reaction to anesthesia. What happens before the procedure? You may have tests done before the procedure, including:  Blood tests.  Urine tests.  X-rays.  Other imaging tests (such as CT scans, MRI scans, and PET scans). These tests are done to find the exact size and location  of the condition being treated with this surgery.  Pulmonary function tests. These are breathing tests to assess the function of your lungs before surgery and to decide how to best help your breathing after surgery.  Heart testing. This is done to make sure your heart is strong enough for the procedure.  Bronchoscopy. This is a technique that allows your health care provider to look at the inside of your airways. This is done using a soft, flexible tube (bronchoscope). Along with imaging tests, this can help your health care provider know the exact location and size of the area that will be removed during surgery.  Lymph node sampling. This may need to be done to see if the tumor has spread. It may be done as a separate surgery or right before your lung resection procedure. What happens during the procedure?  An IV tube will be placed in your arm. You will be given a medicine that makes you fall asleep (general anesthetic). You may also get pain medicine  through a thin, flexible tube (catheter) in your back.  A breathing tube will be placed in your throat.  Once the surgical team has prepared you for surgery, your surgeon will make an incision on your side. Some resections are done through large incisions, while others can be done through small incisions using smaller instruments and assisted with small cameras (laparoscopic surgery).  Your surgeon will carefully cut the veins, arteries, and bronchus leading to your lung. After being cut, each of these pieces will be sewn or stapled closed. The lung or part of the lung will then be removed.  Your surgeon will check inside your chest to make sure there is no bleeding in or around the lungs. Lymph nodes near the lung may also be removed for later tests.  Your surgeon may put tubes into your chest to drain extra fluid and air after surgery.  Your incision will be closed. This may be done using:  Stitches that absorb into your body and do not need to be removed.  Stitches that must be removed.  Staples that must be removed. What happens after the procedure?  You will be taken to the recovery area and your progress will be monitored. You may still have a breathing tube and other tubes or catheters in your body immediately after surgery. These will be removed during your recovery. You may be put on a respirator following surgery if some assistance is needed to help your breathing. When you are awake and not experiencing immediate problems from surgery, you will be moved to the intensive care unit (ICU) where you will continue your recovery.  You may feel pain in your chest and throat. Sometimes during recovery, patients may shiver or feel nauseous. You will be given medicine to help with pain and nausea.  The breathing tube will be taken out as soon as your health care providers feel you can breathe on your own. For most people, this happens on the same day as the surgery.  If your surgery and  time in the ICU go well, most of the tubes and equipment will be taken out within 1-2 days after surgery. This is about how long most people stay in the ICU. You may need to stay longer, depending on how you are doing.  You should also start respiratory therapy in the ICU. This therapy uses breathing exercises to help your other lung stay healthy and get stronger.  As you improve, you will be moved  to a regular hospital room for continued respiratory therapy, help with your bladder and bowels, and to continue medicines.  After your lung or part of your lung is taken out, there will be a space inside your chest. This space will often fill up with fluid over time. The amount of time this takes is different for each person.  You will receive care until you are doing well and your health care provider feels it is safe for you to go home or to transfer to an extended care facility. This information is not intended to replace advice given to you by your health care provider. Make sure you discuss any questions you have with your health care provider. Document Released: 09/10/2002 Document Revised: 11/26/2015 Document Reviewed: 08/09/2013  2017 Elsevier  Lung Resection, Care After Introduction Refer to this sheet in the next few weeks. These instructions provide you with information on caring for yourself after your procedure. Your health care provider may also give you more specific instructions. Your treatment has been planned according to current medical practices, but problems sometimes occur. Call your health care provider if you have any problems or questions after your procedure. What can I expect after the procedure? After your procedure, it is typical to have the following:  You may feel pain in your chest and throat.  Patients may sometimes shiver or feel nauseous during recovery. Follow these instructions at home:  You may resume a normal diet and activities as directed by your health  care provider.  Do not use any tobacco products, including cigarettes, chewing tobacco, or electronic cigarettes. If you need help quitting, ask your health care provider.  There are many different ways to close and cover an incision, including stitches, skin glue, and adhesive strips. Follow your health care provider's instructions on:  Incision care.  Bandage (dressing) changes and removal.  Incision closure removal.  Take medicines only as directed by your health care provider.  Keep all follow-up visits as directed by your health care provider. This is important.  Try to breathe deeply and cough as directed. Holding a pillow firmly over your ribs may help with discomfort.  If you were given an incentive spirometer in the hospital, continue to use it as directed by your health care provider.  Walk as directed by your health care provider.  You may take a shower and gently wash the area of your incision with water and soap as directed by your health care provider. Do not use anything else to clean your incision except as directed by your health care provider. Do not take baths, swim, or use a hot tub until your health care provider approves. Contact a health care provider if:  You notice redness, swelling, or increasing pain at the incision site.  You are bleeding at the incision site.  You see pus coming from the incision site.  You notice a bad smell coming from the incision site or bandage.  Your incision breaks open.  You cough up blood or pus, or you develop a cough that produces bad-smelling sputum.  You have pain or swelling in your legs.  You have increasing pain that is not controlled with medicine.  You have trouble managing any of the tubes that have been left in place after surgery.  You have fever or chills. Get help right away if:  You have chest pain or an irregular or rapid heartbeat.  You have dizzy episodes or faint.  You have shortness of breath  or difficulty breathing.  You have persistent nausea or vomiting.  You have a rash. This information is not intended to replace advice given to you by your health care provider. Make sure you discuss any questions you have with your health care provider. Document Released: 01/07/2005 Document Revised: 11/26/2015 Document Reviewed: 08/09/2013  2017 Elsevier

## 2016-07-14 NOTE — Progress Notes (Signed)
[address]       [place], [address]             [phone]                    [name] [facility]ura Medical Record #[id number] Date of Birth: 04-18-1951  Referring: No ref. provider found Primary Care: Pcp Not In System  Chief Complaint:    Chief Complaint  Patient presents with  . Lung Lesion    Further discuss surgery f/u after Cardiac clearance, CPX test, Head CT     History of Present Illness:    [name] 66 y.o. male is seen in the office  today for right lung mass. He is long term smoker1-2  ppd for 40 years. He presented to urgent care with increased cough and fever. He was treated for copd excerebration and pneumonia. He noted improvement with antibiotics, inhalers and steroids.Follow up  up chest xray showed right lung mass. Follow ct and PET scan done in [place] ( under name [name]) suggestive of 2.5 cm right upper lobe hypermetabolic   mass highly suggestive of primary lung cancer. He is referred for consideration of surgical resection.      CPX testing has been done and Cardiolite stress testing     Current Activity/ Functional Status:  Patient is independent with mobility/ambulation, transfers, ADL's, IADL's.   Zubrod Score: At the time of surgery this patient's most appropriate activity status/level should be described as: '[]'$     0    Normal activity, no symptoms '[x]'$     1    Restricted in physical strenuous activity but ambulatory, able to do out light work '[]'$     2    Ambulatory and capable of self care, unable to do work activities, up and about               >50 % of waking hours                              '[]'$     3    Only limited self care, in bed greater than 50% of waking hours '[]'$     4    Completely disabled, no self care, confined to bed or chair '[]'$     5    Moribund   Past Medical History:  Diagnosis Date  . Arthritis   . Cancer [facility])    pt states possible lung cancer to right lung  . COPD (chronic obstructive pulmonary  disease) ([place])   . Dyspnea    with exertion  . Head injury    a. remotely with cranial plate in place.  . Lung mass    a. @ [facility] - 2.5 cm right upper lobe hypermetabolic mass highly suggestive of primary lung cancer.   . Tobacco abuse     Past Surgical History:  Procedure Laterality Date  . CARPAL TUNNEL RELEASE Right   . COLONOSCOPY    . ELBOW SURGERY     surgery x2 on right elbow  . EYE SURGERY     eye socket fracture, metal plate on left side of face  . HAND SURGERY     multiple hand surgeries on right hand  . SKIN GRAFT     to right hand   head injury yars ago with cranial plate in place.  Family history: Father died prostate cancer age 26  Mother died age 62 brain aneurysm One bother 2 sisters  diabeties  Social History   Social History  . Marital status: Married    Spouse name: N/A  . Number of children: N/A  . Years of education: N/A   Occupational History  . [employer] manager at [employer] , works full time   Social History Main Topics  . Smoking status: Current Every Day Smoker    Packs/day: 1.00    Years: 50.00    Types: Cigarettes  . Smokeless tobacco: Never Used  . Alcohol use No  . Drug use: No  . Sexual activity: Not on file     History  Smoking Status  . Current Every Day Smoker  . Packs/day: 1.00  . Years: 50.00  . Types: Cigarettes  Smokeless Tobacco  . Never Used    History  Alcohol Use No     No Known Allergies  Current Outpatient Prescriptions  Medication Sig Dispense Refill  . albuterol (PROVENTIL HFA;VENTOLIN HFA) 108 (90 Base) MCG/ACT inhaler Inhale 2 puffs into the lungs every 6 (six) hours as needed for wheezing or shortness of breath.    . budesonide-formoterol (SYMBICORT) 160-4.5 MCG/ACT inhaler Inhale 2 puffs into the lungs 2 (two) times daily.    Marland Kitchen SPIRIVA RESPIMAT 2.5 MCG/ACT AERS Inhale 2 puffs into the lungs 2 (two) times daily.     No current facility-administered medications for this visit.       Review of  Systems:     Cardiac Review of Systems: Y or N  Chest Pain [ n   ]  Resting SOB [ n  ] Exertional SOB  [ y ]  Orthopnea [ n ]   Pedal Edema [ n  ]    Palpitations [n  ] Syncope  [n  ]   Presyncope [ n  ]  General Review of Systems: [Y] = yes [  ]=no Constitional: recent weight change [ n ];  Wt loss over the last 3 months [   ] anorexia [  ]; fatigue [  ]; nausea [  ]; night sweats [  ]; fever [ n ]; or chills [n  ];          Dental: poor dentition[y dentures  ]; Last Dentist visit:   Eye : blurred vision [  ]; diplopia [   ]; vision changes [  ];  Amaurosis fugax[  ]; Resp: cough Blue.Reese  ];  wheezing[y  ];  hemoptysis[n  ]; shortness of breath[ n ]; paroxysmal nocturnal dyspnea[ n ]; dyspnea on exertion[y  ]; or orthopnea[  ];  GI:  gallstones[  ], vomiting[  ];  dysphagia[  ]; melena[  ];  hematochezia [  ]; heartburn[  ];   Hx of  Colonoscopy[ n ]; GU: kidney stones [  ]; hematuria[  ];   dysuria [  ];  nocturia[  ];  history of     obstruction [  ]; urinary frequency [  ]             Skin: rash, swelling[  ];, hair loss[  ];  peripheral edema[  ];  or itching[  ]; Musculosketetal: myalgias[  ];  joint swelling[  ];  joint erythema[  ];  joint pain[  ];  back pain[  ];  Heme/Lymph: bruising[  ];  bleeding[  ];  anemia[  ];  Neuro: TIA[  ];  headaches[  ];  stroke[  ];  vertigo[  ];  seizures[  ];   paresthesias[  ];  difficulty walking[  ];  Psych:depression[  ]; anxiety[  ];  Endocrine: diabetes[N  ];  thyroid dysfunction[  ];  Immunizations: Flu up to date [ N ]; Pneumococcal up to date [ N ];  Other:  Physical Exam: BP 119/76   Pulse 65   Resp 20   Ht 6' (1.829 m)   Wt 160 lb (72.6 kg)   SpO2 96%   BMI 21.70 kg/m   PHYSICAL EXAMINATION: General appearance: alert, cooperative, appears older than stated age and no distress Head: Normocephalic, without obvious abnormality, atraumatic Neck: no adenopathy, no carotid bruit, no JVD, supple, symmetrical, trachea midline and thyroid not  enlarged, symmetric, no tenderness/mass/nodules Lymph nodes: Cervical, supraclavicular, and axillary nodes normal. Resp: clear to auscultation bilaterally Back: symmetric, no curvature. ROM normal. No CVA tenderness. Cardio: regular rate and rhythm, S1, S2 normal, no murmur, click, rub or gallop GI: soft, non-tender; bowel sounds normal; no masses,  no organomegaly Extremities: extremities normal, atraumatic, no cyanosis or edema and clubbing of fingers with nicotine stains Neurologic: Grossly normal  Diagnostic Studies & Laboratory data:     Recent Radiology Findings:   see pet and ct of chest in pac system under name [place], Azure T 2.4 cm right upper lobe mass with evidence of emphysemic pulmonary changes  I have independently reviewed the above radiology studies  and reviewed the findings with the patient.  Cardiolite:  Nuclear stress EF: 57%. No wall motion abnormalities  There was no ST segment deviation noted during stress. No perfusion defects.  Low risk study, no ischemia.   Candee Furbish, MD    PFT's  FEV1 1.75  49% DLC0 11.6 55%  Referred for: Risk Stratification for Lung Resection  Procedure: This patient underwent staged symptom-limited exercise testing using a individualized bike protocol with expired gas analysis metabolic evaluation during exercise.  Demographics  Age: 89 Ht. (in.) 29 Wt. (lb) 156 BMI: 21.2   Predicted Peak VO2: 26.2   Gender: Male Ht (cm) 182.9 Wt. (kg) 70.8    Results  Pre-Exercise PFTs   FVC 4.19 (88%)     FEV1 1.95 (54%)      FEV1/FVC 47 (61%)      MVV 89 (64%)     Exercise Time:  10:13   Watts: 100  RPE: 17  Reason stopped: patient ended test due to leg fatigue  Additional symptoms: Dyspnea (7/10)  Resting HR: 67 Peak HR: 130  (84% age predicted max HR)  BP rest: 102/68 BP peak: 192/80  Peak VO2: 19.9 (76% predicted peak VO2)  VE/VCO2 slope: 46  OUES: 1.53  Peak RER: 1.13  Ventilatory  Threshold: 16.3 (62% predicted or measured peak VO2)  Peak RR 56  Peak Ventilation: 82  VE/MVV: 92%  PETCO2 at peak: 24  O2pulse: 11  (92% predicted O2pulse)   Interpretation  Notes: Patient gave a very good effort. Pulse-oximetry remained 96% or above throughout exercise. Exercise was performed on a cycle ergometer starting at [facility] and increasing by 10W/min.   ECG: Resting ECG in normal sinus rhythm. HR response mildly blunted. There were occasional PVCs but no sustained arrhythmias and no significant ST-T changes. BP mildly hypertensive at peak exercise.  PFT: Pre-exercise spirometry demonstrates moderate obstruction. MVV below normal.   CPX: Exercise testing with gas exchange demonstrates a mildly reduced peak VO2 of 19.9 ml/kg/min (76% of the age/gender/weight matched sedentary norms). The RER of 1.13 indicates a maximal effort. The VE/VCO2  slope is significantly elevated and indicates excessive dead space ventilation. The oxygen uptake efficiency slope (OUES) is reduced. The VO2 at the ventilatory threshold was normal at 16.3% of the predicted peak VO2. At peak exercise, the ventilation reached 92% of the measured MVV indicating ventilatory limits were nearly approached. The O2pulse (a surrogate for stroke volume) increased mildly with exercise intensity reaching peak at 11 ml/beat (92% predicted).    Conclusion: Exercise testing with gas exchange demonstrates mild functional impairment when compared to matched sedentary norms. No clear cardiovascular limitation. Obstructive lung patterns demonstrated with pre-exercise spirometry and high VE/MVV ratio indicate primary ventilatory limitation. VE/VCO2 slope also significantly elevated indicating excessive dead space ventilation most likely resulting from pulmonary obstructive physiology, Although PVO2 reduced (19.9 ml/kg/min, 76% predicted), there is little increased risk associated with lung resection. Note patient was mildly  hypertensive at peak exercise.    Test, report and preliminary impression by: Landis Martins, MS, ACSM-RCEP 07/12/2016 12:37 PM  CPX testing shows only mildly reduced functional capacity due to ventilatory limitation. Spirometry reflect moderate obstructive lung disease. Cannot exclude a component of circulatory limitation with high VE/VCO2 slope and reduced OUES but pulmonary limitation predominates. Exercise ECG without evidence of ischemia.   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 CPX testing with V02 max 19.9   I have reviewed with the patient and wife, treatment options for what is clinical stage 1 lung cancer of the right upper lobe. He has cutt smoking back to 5 cig a day. He is agreeable with recommendation to proceed with surgical resection of his right lung lesion for dx and rx. Will plan to precede with bronchoscopy, right VATS, lung biopsy , resection next week. Cardiology clearance has been obtained. He and his wife have had there questions answered    [name] MD      [address] [place] [address] Office [phone]   Beeper [phone]  07/17/2016 10:33 AM

## 2016-07-15 ENCOUNTER — Ambulatory Visit (HOSPITAL_COMMUNITY)
Admission: RE | Admit: 2016-07-15 | Discharge: 2016-07-15 | Disposition: A | Discharge status: Home / Self Care | Payer: Managed Care, Other (non HMO) | Source: Ambulatory Visit | Attending: Cardiothoracic Surgery | Admitting: Cardiothoracic Surgery

## 2016-07-15 ENCOUNTER — Encounter (HOSPITAL_COMMUNITY)
Admission: RE | Admit: 2016-07-15 | Discharge: 2016-07-15 | Disposition: A | Discharge status: Home / Self Care | Payer: Managed Care, Other (non HMO) | Source: Ambulatory Visit | Attending: Cardiothoracic Surgery | Admitting: Cardiothoracic Surgery

## 2016-07-15 ENCOUNTER — Encounter (HOSPITAL_COMMUNITY): Payer: Self-pay

## 2016-07-15 DIAGNOSIS — R911 Solitary pulmonary nodule: Secondary | ICD-10-CM | POA: Insufficient documentation

## 2016-07-15 DIAGNOSIS — J449 Chronic obstructive pulmonary disease, unspecified: Secondary | ICD-10-CM | POA: Diagnosis not present

## 2016-07-15 DIAGNOSIS — Z01818 Encounter for other preprocedural examination: Secondary | ICD-10-CM | POA: Diagnosis not present

## 2016-07-15 HISTORY — DX: Malignant (primary) neoplasm, unspecified: C80.1

## 2016-07-15 HISTORY — DX: Chronic obstructive pulmonary disease, unspecified: J44.9

## 2016-07-15 HISTORY — DX: Dyspnea, unspecified: R06.00

## 2016-07-15 LAB — BLOOD GAS, ARTERIAL
Acid-Base Excess: 0.7 mmol/L (ref 0.0–2.0)
Bicarbonate: 24.7 mmol/L (ref 20.0–28.0)
FIO2: 0.21
O2 Saturation: 95.7 %
Patient temperature: 98.6
pCO2 arterial: 38.6 mmHg (ref 32.0–48.0)
pH, Arterial: 7.422 (ref 7.350–7.450)
pO2, Arterial: 77.1 mmHg — ABNORMAL LOW (ref 83.0–108.0)

## 2016-07-15 LAB — CBC
HCT: 44.7 % (ref 39.0–52.0)
Hemoglobin: 15.3 g/dL (ref 13.0–17.0)
MCH: 31 pg (ref 26.0–34.0)
MCHC: 34.2 g/dL (ref 30.0–36.0)
MCV: 90.5 fL (ref 78.0–100.0)
Platelets: 175 10*3/uL (ref 150–400)
RBC: 4.94 MIL/uL (ref 4.22–5.81)
RDW: 14.7 % (ref 11.5–15.5)
WBC: 7.8 10*3/uL (ref 4.0–10.5)

## 2016-07-15 LAB — COMPREHENSIVE METABOLIC PANEL
ALT: 10 U/L — ABNORMAL LOW (ref 17–63)
AST: 14 U/L — ABNORMAL LOW (ref 15–41)
Albumin: 3.8 g/dL (ref 3.5–5.0)
Alkaline Phosphatase: 77 U/L (ref 38–126)
Anion gap: 8 (ref 5–15)
BUN: 10 mg/dL (ref 6–20)
CO2: 22 mmol/L (ref 22–32)
Calcium: 9.3 mg/dL (ref 8.9–10.3)
Chloride: 106 mmol/L (ref 101–111)
Creatinine, Ser: 0.72 mg/dL (ref 0.61–1.24)
GFR calc Af Amer: >60 mL/min (ref >60)
GFR calc non Af Amer: >60 mL/min (ref >60)
Glucose, Bld: 100 mg/dL — ABNORMAL HIGH (ref 65–99)
Potassium: 3.8 mmol/L (ref 3.5–5.1)
Sodium: 136 mmol/L (ref 135–145)
Total Bilirubin: 0.5 mg/dL (ref 0.3–1.2)
Total Protein: 6.6 g/dL (ref 6.5–8.1)

## 2016-07-15 LAB — URINALYSIS, ROUTINE W REFLEX MICROSCOPIC
Bilirubin Urine: NEGATIVE
Glucose, UA: NEGATIVE mg/dL
Hgb urine dipstick: NEGATIVE
Ketones, ur: NEGATIVE mg/dL
Leukocytes, UA: NEGATIVE
Nitrite: NEGATIVE
Protein, ur: NEGATIVE mg/dL
Specific Gravity, Urine: 1.006 (ref 1.005–1.030)
pH: 7 (ref 5.0–8.0)

## 2016-07-15 LAB — PROTIME-INR
INR: 0.94
Prothrombin Time: 12.6 seconds (ref 11.4–15.2)

## 2016-07-15 LAB — ABO/RH: ABO/RH(D): O POS

## 2016-07-15 LAB — TYPE AND SCREEN
ABO/RH(D): O POS
Antibody Screen: NEGATIVE

## 2016-07-15 LAB — APTT: aPTT: 39 seconds — ABNORMAL HIGH (ref 24–36)

## 2016-07-15 LAB — SURGICAL PCR SCREEN
MRSA, PCR: NEGATIVE
Staphylococcus aureus: NEGATIVE

## 2016-07-15 NOTE — Pre-Procedure Instructions (Signed)
[name]  07/15/2016      [facility] [place], [address] [place] [address] Phone: [phone] Fax: [phone]    Your procedure is scheduled on Tuesday January 16.  Report to [facility] Admitting at 5:30 A.M.  Call this number if you have problems the morning of surgery:  [phone]   Remember:  Do not eat food or drink liquids after midnight.  Take these medicines the morning of surgery with A SIP OF WATER: albuterol, Symbicort, Spiriva  7 days prior to surgery STOP taking any Aspirin, Aleve, Naproxen, Ibuprofen, Motrin, Advil, Goody's, BC's, all herbal medications, fish oil, and all vitamins    Do not wear jewelry, make-up or nail polish.  Do not wear lotions, powders, or perfumes, or deoderant.  Do not shave 48 hours prior to surgery.  Men may shave face and neck.  Do not bring valuables to the hospital.  [facility] is not responsible for any belongings or valuables.  Contacts, dentures or bridgework may not be worn into surgery.  Leave your suitcase in the car.  After surgery it may be brought to your room.  For patients admitted to the hospital, discharge time will be determined by your treatment team.  Patients discharged the day of surgery will not be allowed to drive home.    Special instructions:    [place]- Preparing For Surgery  Before surgery, you can play an important role. Because skin is not sterile, your skin needs to be as free of germs as possible. You can reduce the number of germs on your skin by washing with CHG (chlorahexidine gluconate) Soap before surgery.  CHG is an antiseptic cleaner which kills germs and bonds with the skin to continue killing germs even after washing.  Please do not use if you have an allergy to CHG or antibacterial soaps. If your skin becomes reddened/irritated stop using the CHG.  Do not shave  (including legs and underarms) for at least 48 hours prior to first CHG shower. It is OK to shave your face.  Please follow these instructions carefully.   1. Shower the NIGHT BEFORE SURGERY and the MORNING OF SURGERY with CHG.   2. If you chose to wash your hair, wash your hair first as usual with your normal shampoo.  3. After you shampoo, rinse your hair and body thoroughly to remove the shampoo.  4. Use CHG as you would any other liquid soap. You can apply CHG directly to the skin and wash gently with a scrungie or a clean washcloth.   5. Apply the CHG Soap to your body ONLY FROM THE NECK DOWN.  Do not use on open wounds or open sores. Avoid contact with your eyes, ears, mouth and genitals (private parts). Wash genitals (private parts) with your normal soap.  6. Wash thoroughly, paying special attention to the area where your surgery will be performed.  7. Thoroughly rinse your body with warm water from the neck down.  8. DO NOT shower/wash with your normal soap after using and rinsing off the CHG Soap.  9. Pat yourself dry with a CLEAN TOWEL.   10. Wear CLEAN PAJAMAS   11. Place CLEAN SHEETS on your bed the night of your first shower and DO NOT SLEEP WITH PETS.    Day of Surgery: Do not apply any deodorants/lotions. Please  wear clean clothes to the hospital/surgery center.      Please read over the following fact sheets that you were given. MRSA Information

## 2016-07-15 NOTE — Progress Notes (Addendum)
Pt has no PCP, pt states he saw Melina Copa for pre-op clearance and had stress test performed. Normal stress test 07/13/16  Pt with no complaints of chest pain, or signs of infection per pt at PAT appointment. Pt states he has been SOB on exertion d/t lung mass.    EKG: 07/08/16 CXR: 07/15/16  Left message for Ryan with Dr. Carrie Mew office about PTT

## 2016-07-18 DIAGNOSIS — Z736 Limitation of activities due to disability: Secondary | ICD-10-CM

## 2016-07-18 LAB — PULMONARY FUNCTION TEST

## 2016-07-19 ENCOUNTER — Encounter (HOSPITAL_COMMUNITY)
Admission: RE | Disposition: A | Discharge status: Home Health | Payer: Self-pay | Source: Ambulatory Visit | Attending: Cardiothoracic Surgery

## 2016-07-19 ENCOUNTER — Encounter (HOSPITAL_COMMUNITY): Payer: Self-pay | Admitting: *Deleted

## 2016-07-19 ENCOUNTER — Inpatient Hospital Stay (HOSPITAL_COMMUNITY): Payer: Managed Care, Other (non HMO) | Admitting: Certified Registered Nurse Anesthetist

## 2016-07-19 ENCOUNTER — Inpatient Hospital Stay (HOSPITAL_COMMUNITY): Payer: Managed Care, Other (non HMO)

## 2016-07-19 ENCOUNTER — Inpatient Hospital Stay (HOSPITAL_COMMUNITY)
Admission: RE | Admit: 2016-07-19 | Discharge: 2016-08-15 | DRG: 164 | Disposition: A | Discharge status: Home Health | Payer: Managed Care, Other (non HMO) | Source: Ambulatory Visit | Attending: Cardiothoracic Surgery | Admitting: Cardiothoracic Surgery

## 2016-07-19 DIAGNOSIS — Z9889 Other specified postprocedural states: Secondary | ICD-10-CM

## 2016-07-19 DIAGNOSIS — D649 Anemia, unspecified: Secondary | ICD-10-CM | POA: Diagnosis present

## 2016-07-19 DIAGNOSIS — F17210 Nicotine dependence, cigarettes, uncomplicated: Secondary | ICD-10-CM | POA: Diagnosis present

## 2016-07-19 DIAGNOSIS — Z902 Acquired absence of lung [part of]: Secondary | ICD-10-CM

## 2016-07-19 DIAGNOSIS — R911 Solitary pulmonary nodule: Secondary | ICD-10-CM

## 2016-07-19 DIAGNOSIS — M1990 Unspecified osteoarthritis, unspecified site: Secondary | ICD-10-CM | POA: Diagnosis present

## 2016-07-19 DIAGNOSIS — J449 Chronic obstructive pulmonary disease, unspecified: Secondary | ICD-10-CM | POA: Diagnosis present

## 2016-07-19 DIAGNOSIS — J9382 Other air leak: Secondary | ICD-10-CM | POA: Diagnosis not present

## 2016-07-19 DIAGNOSIS — J939 Pneumothorax, unspecified: Secondary | ICD-10-CM

## 2016-07-19 DIAGNOSIS — J95812 Postprocedural air leak: Secondary | ICD-10-CM

## 2016-07-19 DIAGNOSIS — R0602 Shortness of breath: Secondary | ICD-10-CM

## 2016-07-19 DIAGNOSIS — J948 Other specified pleural conditions: Secondary | ICD-10-CM | POA: Diagnosis not present

## 2016-07-19 DIAGNOSIS — C3411 Malignant neoplasm of upper lobe, right bronchus or lung: Secondary | ICD-10-CM | POA: Diagnosis present

## 2016-07-19 DIAGNOSIS — R222 Localized swelling, mass and lump, trunk: Secondary | ICD-10-CM

## 2016-07-19 DIAGNOSIS — J982 Interstitial emphysema: Secondary | ICD-10-CM | POA: Diagnosis not present

## 2016-07-19 DIAGNOSIS — R918 Other nonspecific abnormal finding of lung field: Secondary | ICD-10-CM | POA: Diagnosis present

## 2016-07-19 DIAGNOSIS — Z09 Encounter for follow-up examination after completed treatment for conditions other than malignant neoplasm: Secondary | ICD-10-CM

## 2016-07-19 DIAGNOSIS — R339 Retention of urine, unspecified: Secondary | ICD-10-CM | POA: Diagnosis not present

## 2016-07-19 DIAGNOSIS — J9811 Atelectasis: Secondary | ICD-10-CM

## 2016-07-19 DIAGNOSIS — Z9689 Presence of other specified functional implants: Secondary | ICD-10-CM

## 2016-07-19 HISTORY — PX: LYMPH NODE DISSECTION: SHX5087

## 2016-07-19 HISTORY — PX: LOBECTOMY: SHX5089

## 2016-07-19 HISTORY — PX: WEDGE RESECTION: SHX5070

## 2016-07-19 HISTORY — PX: VIDEO ASSISTED THORACOSCOPY (VATS)/WEDGE RESECTION: SHX6174

## 2016-07-19 HISTORY — PX: VIDEO BRONCHOSCOPY: SHX5072

## 2016-07-19 LAB — GLUCOSE, CAPILLARY
Glucose-Capillary: 118 mg/dL — ABNORMAL HIGH (ref 65–99)
Glucose-Capillary: 155 mg/dL — ABNORMAL HIGH (ref 65–99)
Glucose-Capillary: 182 mg/dL — ABNORMAL HIGH (ref 65–99)

## 2016-07-19 SURGERY — BRONCHOSCOPY, VIDEO-ASSISTED
Anesthesia: General | Site: Chest | Laterality: Right

## 2016-07-19 MED ORDER — PHENYLEPHRINE HCL 10 MG/ML IJ SOLN
INTRAMUSCULAR | Status: DC | PRN
  Administered 2016-07-19: 40 ug via INTRAVENOUS
  Administered 2016-07-19: 80 ug via INTRAVENOUS
  Administered 2016-07-19: 40 ug via INTRAVENOUS
  Administered 2016-07-19 (×2): 80 ug via INTRAVENOUS
  Administered 2016-07-19: 40 ug via INTRAVENOUS

## 2016-07-19 MED ORDER — TIOTROPIUM BROMIDE MONOHYDRATE 18 MCG IN CAPS
1.0000 | ORAL_CAPSULE | Freq: Every day | RESPIRATORY_TRACT | Status: DC
  Administered 2016-07-20 – 2016-08-03 (×14): 18 ug via RESPIRATORY_TRACT
  Filled 2016-07-19 (×3): qty 5

## 2016-07-19 MED ORDER — ONDANSETRON HCL 4 MG/2ML IJ SOLN: 4.0000 mg | Freq: Once | INTRAMUSCULAR | Status: DC | PRN

## 2016-07-19 MED ORDER — FENTANYL CITRATE (PF) 100 MCG/2ML IJ SOLN
INTRAMUSCULAR | Status: AC
  Filled 2016-07-19: qty 2

## 2016-07-19 MED ORDER — OXYCODONE HCL 5 MG PO TABS
5.0000 mg | ORAL_TABLET | ORAL | Status: DC | PRN
  Administered 2016-07-21 (×2): 5 mg via ORAL
  Administered 2016-07-22 (×2): 10 mg via ORAL
  Administered 2016-07-23 – 2016-07-24 (×4): 5 mg via ORAL
  Administered 2016-07-24 – 2016-07-27 (×18): 10 mg via ORAL
  Administered 2016-07-28: 5 mg via ORAL
  Administered 2016-07-28 – 2016-07-29 (×3): 10 mg via ORAL
  Administered 2016-07-29: 5 mg via ORAL
  Administered 2016-07-30 (×2): 10 mg via ORAL
  Administered 2016-07-30 (×2): 5 mg via ORAL
  Administered 2016-07-31 – 2016-08-02 (×7): 10 mg via ORAL
  Administered 2016-08-02: 5 mg via ORAL
  Administered 2016-08-03 (×3): 10 mg via ORAL
  Filled 2016-07-19: qty 1
  Filled 2016-07-19 (×12): qty 2
  Filled 2016-07-19 (×3): qty 1
  Filled 2016-07-19 (×8): qty 2
  Filled 2016-07-19: qty 1
  Filled 2016-07-19 (×3): qty 2
  Filled 2016-07-19 (×4): qty 1
  Filled 2016-07-19: qty 2
  Filled 2016-07-19: qty 1
  Filled 2016-07-19 (×9): qty 2
  Filled 2016-07-19: qty 1
  Filled 2016-07-19 (×2): qty 2

## 2016-07-19 MED ORDER — ROCURONIUM BROMIDE 100 MG/10ML IV SOLN
INTRAVENOUS | Status: DC | PRN
  Administered 2016-07-19: 10 mg via INTRAVENOUS
  Administered 2016-07-19 (×2): 50 mg via INTRAVENOUS
  Administered 2016-07-19: 10 mg via INTRAVENOUS
  Administered 2016-07-19: 20 mg via INTRAVENOUS

## 2016-07-19 MED ORDER — TRAMADOL HCL 50 MG PO TABS
50.0000 mg | ORAL_TABLET | Freq: Four times a day (QID) | ORAL | Status: DC | PRN
Start: 2016-07-19 — End: 2016-08-04
  Administered 2016-07-20 – 2016-08-03 (×12): 100 mg via ORAL
  Filled 2016-07-19 (×12): qty 2

## 2016-07-19 MED ORDER — SENNOSIDES-DOCUSATE SODIUM 8.6-50 MG PO TABS
1.0000 | ORAL_TABLET | Freq: Every day | ORAL | Status: DC
  Administered 2016-07-19 – 2016-08-03 (×13): 1 via ORAL
  Filled 2016-07-19 (×14): qty 1

## 2016-07-19 MED ORDER — DEXAMETHASONE SODIUM PHOSPHATE 10 MG/ML IJ SOLN
INTRAMUSCULAR | Status: AC
  Filled 2016-07-19: qty 1

## 2016-07-19 MED ORDER — PROPOFOL 10 MG/ML IV BOLUS
INTRAVENOUS | Status: DC | PRN
  Administered 2016-07-19: 150 mg via INTRAVENOUS

## 2016-07-19 MED ORDER — INSULIN ASPART 100 UNIT/ML ~~LOC~~ SOLN
0.0000 [IU] | SUBCUTANEOUS | Status: DC
  Administered 2016-07-19: 4 [IU] via SUBCUTANEOUS
  Administered 2016-07-19 – 2016-07-22 (×7): 2 [IU] via SUBCUTANEOUS
  Administered 2016-07-22 – 2016-07-23 (×2): 4 [IU] via SUBCUTANEOUS
  Administered 2016-07-24 – 2016-07-26 (×6): 2 [IU] via SUBCUTANEOUS
  Administered 2016-07-26 – 2016-07-27 (×2): 4 [IU] via SUBCUTANEOUS
  Administered 2016-07-27 – 2016-07-29 (×5): 2 [IU] via SUBCUTANEOUS

## 2016-07-19 MED ORDER — BUPIVACAINE HCL (PF) 0.5 % IJ SOLN
INTRAMUSCULAR | Status: DC | PRN
  Administered 2016-07-19: 10 mL

## 2016-07-19 MED ORDER — BUPIVACAINE HCL (PF) 0.5 % IJ SOLN
INTRAMUSCULAR | Status: AC
  Filled 2016-07-19: qty 10

## 2016-07-19 MED ORDER — ONDANSETRON HCL 4 MG/2ML IJ SOLN
INTRAMUSCULAR | Status: AC
  Filled 2016-07-19: qty 2

## 2016-07-19 MED ORDER — SODIUM CHLORIDE 0.9 % IV SOLN
30.0000 meq | Freq: Every day | INTRAVENOUS | Status: DC | PRN
  Administered 2016-07-23: 30 meq via INTRAVENOUS
  Filled 2016-07-19 (×3): qty 15

## 2016-07-19 MED ORDER — ACETAMINOPHEN 160 MG/5ML PO SOLN: 1000.0000 mg | Freq: Four times a day (QID) | ORAL | Status: AC

## 2016-07-19 MED ORDER — OXYCODONE HCL 5 MG PO TABS: 5.0000 mg | ORAL_TABLET | Freq: Once | ORAL | Status: DC | PRN

## 2016-07-19 MED ORDER — MIDAZOLAM HCL 2 MG/2ML IJ SOLN
INTRAMUSCULAR | Status: AC
  Filled 2016-07-19: qty 2

## 2016-07-19 MED ORDER — LACTATED RINGERS IV SOLN
INTRAVENOUS | Status: DC | PRN
  Administered 2016-07-19: 07:00:00 via INTRAVENOUS

## 2016-07-19 MED ORDER — DEXTROSE 5 % IV SOLN
1.5000 g | INTRAVENOUS | Status: DC
  Filled 2016-07-19: qty 1.5

## 2016-07-19 MED ORDER — HYDROMORPHONE HCL 1 MG/ML IJ SOLN
INTRAMUSCULAR | Status: AC
  Administered 2016-07-19: 15:00:00
  Filled 2016-07-19: qty 0.5

## 2016-07-19 MED ORDER — SODIUM CHLORIDE 0.9% FLUSH: 9.0000 mL | INTRAVENOUS | Status: DC | PRN

## 2016-07-19 MED ORDER — DEXTROSE-NACL 5-0.45 % IV SOLN
INTRAVENOUS | Status: DC
  Administered 2016-07-19 – 2016-07-20 (×2): via INTRAVENOUS

## 2016-07-19 MED ORDER — OXYCODONE HCL 5 MG/5ML PO SOLN: 5.0000 mg | Freq: Once | ORAL | Status: DC | PRN

## 2016-07-19 MED ORDER — FENTANYL CITRATE (PF) 100 MCG/2ML IJ SOLN
INTRAMUSCULAR | Status: AC
  Filled 2016-07-19: qty 4

## 2016-07-19 MED ORDER — PHENYLEPHRINE 40 MCG/ML (10ML) SYRINGE FOR IV PUSH (FOR BLOOD PRESSURE SUPPORT)
PREFILLED_SYRINGE | INTRAVENOUS | Status: AC
  Filled 2016-07-19: qty 10

## 2016-07-19 MED ORDER — BISACODYL 5 MG PO TBEC
10.0000 mg | DELAYED_RELEASE_TABLET | Freq: Every day | ORAL | Status: DC
  Administered 2016-07-19 – 2016-07-30 (×11): 10 mg via ORAL
  Filled 2016-07-19 (×13): qty 2

## 2016-07-19 MED ORDER — ACETAMINOPHEN 500 MG PO TABS
1000.0000 mg | ORAL_TABLET | Freq: Four times a day (QID) | ORAL | Status: AC
  Administered 2016-07-19 – 2016-07-24 (×18): 1000 mg via ORAL
  Filled 2016-07-19 (×17): qty 2

## 2016-07-19 MED ORDER — PROPOFOL 10 MG/ML IV BOLUS
INTRAVENOUS | Status: AC
  Filled 2016-07-19: qty 40

## 2016-07-19 MED ORDER — BUPIVACAINE 0.5 % ON-Q PUMP SINGLE CATH 400 ML
400.0000 mL | INJECTION | Status: AC
  Filled 2016-07-19: qty 400

## 2016-07-19 MED ORDER — HYDROMORPHONE HCL 1 MG/ML IJ SOLN
INTRAMUSCULAR | Status: AC
  Administered 2016-07-19: 0.5 mg via INTRAVENOUS
  Filled 2016-07-19: qty 0.5

## 2016-07-19 MED ORDER — MIDAZOLAM HCL 5 MG/5ML IJ SOLN
INTRAMUSCULAR | Status: DC | PRN
  Administered 2016-07-19: 2 mg via INTRAVENOUS

## 2016-07-19 MED ORDER — NALOXONE HCL 0.4 MG/ML IJ SOLN: 0.4000 mg | INTRAMUSCULAR | Status: DC | PRN

## 2016-07-19 MED ORDER — HYDROMORPHONE HCL 1 MG/ML IJ SOLN
0.2500 mg | INTRAMUSCULAR | Status: DC | PRN
  Administered 2016-07-19 (×4): 0.5 mg via INTRAVENOUS

## 2016-07-19 MED ORDER — DIPHENHYDRAMINE HCL 50 MG/ML IJ SOLN: 12.5000 mg | Freq: Four times a day (QID) | INTRAMUSCULAR | Status: DC | PRN

## 2016-07-19 MED ORDER — DEXAMETHASONE SODIUM PHOSPHATE 10 MG/ML IJ SOLN
INTRAMUSCULAR | Status: DC | PRN
  Administered 2016-07-19: 10 mg via INTRAVENOUS

## 2016-07-19 MED ORDER — GLYCOPYRROLATE 0.2 MG/ML IJ SOLN
INTRAMUSCULAR | Status: DC | PRN
  Administered 2016-07-19 (×2): 0.2 mg via INTRAVENOUS

## 2016-07-19 MED ORDER — ROCURONIUM BROMIDE 50 MG/5ML IV SOSY
PREFILLED_SYRINGE | INTRAVENOUS | Status: AC
  Filled 2016-07-19: qty 5

## 2016-07-19 MED ORDER — FENTANYL 40 MCG/ML IV SOLN
INTRAVENOUS | Status: DC
  Administered 2016-07-19: 45 ug via INTRAVENOUS
  Administered 2016-07-19: 14:00:00 via INTRAVENOUS
  Administered 2016-07-19: 15 ug via INTRAVENOUS
  Administered 2016-07-20: 105 ug via INTRAVENOUS
  Administered 2016-07-20: 30 ug via INTRAVENOUS
  Administered 2016-07-20: 75 ug via INTRAVENOUS
  Administered 2016-07-21: 1000 ug via INTRAVENOUS
  Administered 2016-07-21: 225 ug via INTRAVENOUS
  Administered 2016-07-21: 195 ug via INTRAVENOUS
  Administered 2016-07-21: 150 ug via INTRAVENOUS
  Administered 2016-07-22: 45 ug via INTRAVENOUS
  Administered 2016-07-22: 180 ug via INTRAVENOUS
  Administered 2016-07-22: 120 ug via INTRAVENOUS
  Administered 2016-07-22: 60 ug via INTRAVENOUS
  Administered 2016-07-22: 135 ug via INTRAVENOUS
  Administered 2016-07-23: 01:00:00 via INTRAVENOUS
  Administered 2016-07-23: 150 ug via INTRAVENOUS
  Administered 2016-07-23: 45 ug via INTRAVENOUS
  Administered 2016-07-23: 120 ug via INTRAVENOUS
  Administered 2016-07-23: 60 ug via INTRAVENOUS
  Administered 2016-07-24: 165 ug via INTRAVENOUS
  Administered 2016-07-24 (×2): 180 ug via INTRAVENOUS
  Administered 2016-07-24: 150 ug via INTRAVENOUS
  Administered 2016-07-24: 145 ug via INTRAVENOUS
  Administered 2016-07-24: 120 ug via INTRAVENOUS
  Administered 2016-07-25: 45 ug via INTRAVENOUS
  Administered 2016-07-25: 60 ug via INTRAVENOUS
  Administered 2016-07-25: 135 ug via INTRAVENOUS
  Filled 2016-07-19 (×5): qty 25

## 2016-07-19 MED ORDER — HEMOSTATIC AGENTS (NO CHARGE) OPTIME
TOPICAL | Status: DC | PRN
  Administered 2016-07-19: 1 via TOPICAL

## 2016-07-19 MED ORDER — 0.9 % SODIUM CHLORIDE (POUR BTL) OPTIME
TOPICAL | Status: DC | PRN
  Administered 2016-07-19 (×3): 1000 mL

## 2016-07-19 MED ORDER — ALBUTEROL SULFATE (2.5 MG/3ML) 0.083% IN NEBU
3.0000 mL | INHALATION_SOLUTION | Freq: Four times a day (QID) | RESPIRATORY_TRACT | Status: DC | PRN
  Administered 2016-07-27 (×2): 3 mL via RESPIRATORY_TRACT
  Filled 2016-07-19 (×2): qty 3

## 2016-07-19 MED ORDER — ONDANSETRON HCL 4 MG/2ML IJ SOLN: 4.0000 mg | Freq: Four times a day (QID) | INTRAMUSCULAR | Status: DC | PRN

## 2016-07-19 MED ORDER — SUGAMMADEX SODIUM 200 MG/2ML IV SOLN
INTRAVENOUS | Status: DC | PRN
  Administered 2016-07-19: 200 mg via INTRAVENOUS

## 2016-07-19 MED ORDER — SUGAMMADEX SODIUM 200 MG/2ML IV SOLN
INTRAVENOUS | Status: AC
  Filled 2016-07-19: qty 2

## 2016-07-19 MED ORDER — TIOTROPIUM BROMIDE MONOHYDRATE 18 MCG IN CAPS
1.0000 | ORAL_CAPSULE | Freq: Every day | RESPIRATORY_TRACT | Status: DC
  Filled 2016-07-19: qty 5

## 2016-07-19 MED ORDER — MOMETASONE FURO-FORMOTEROL FUM 200-5 MCG/ACT IN AERO
2.0000 | INHALATION_SPRAY | Freq: Two times a day (BID) | RESPIRATORY_TRACT | Status: DC
  Administered 2016-07-19 – 2016-08-03 (×31): 2 via RESPIRATORY_TRACT
  Filled 2016-07-19: qty 8.8

## 2016-07-19 MED ORDER — ONDANSETRON HCL 4 MG/2ML IJ SOLN
INTRAMUSCULAR | Status: DC | PRN
  Administered 2016-07-19: 4 mg via INTRAVENOUS

## 2016-07-19 MED ORDER — HYDROMORPHONE HCL 1 MG/ML IJ SOLN
INTRAMUSCULAR | Status: AC
  Administered 2016-07-19: 0.5 mg via INTRAVENOUS
  Filled 2016-07-19: qty 1

## 2016-07-19 MED ORDER — DIPHENHYDRAMINE HCL 12.5 MG/5ML PO ELIX: 12.5000 mg | ORAL_SOLUTION | Freq: Four times a day (QID) | ORAL | Status: DC | PRN

## 2016-07-19 MED ORDER — BUPIVACAINE 0.5 % ON-Q PUMP SINGLE CATH 400 ML
INJECTION | Status: DC | PRN
  Administered 2016-07-19: 400 mL

## 2016-07-19 MED ORDER — ROCURONIUM BROMIDE 50 MG/5ML IV SOSY
PREFILLED_SYRINGE | INTRAVENOUS | Status: AC
  Filled 2016-07-19: qty 10

## 2016-07-19 MED ORDER — HYDROMORPHONE HCL 1 MG/ML IJ SOLN: 0.5000 mg | INTRAMUSCULAR | Status: DC | PRN

## 2016-07-19 MED ORDER — DEXTROSE 5 % IV SOLN
1.5000 g | INTRAVENOUS | Status: AC
  Administered 2016-07-19 (×2): 1.5 g via INTRAVENOUS
  Filled 2016-07-19: qty 1.5

## 2016-07-19 MED ORDER — DEXTROSE 5 % IV SOLN
1.5000 g | Freq: Two times a day (BID) | INTRAVENOUS | Status: AC
  Administered 2016-07-19 – 2016-07-20 (×2): 1.5 g via INTRAVENOUS
  Filled 2016-07-19 (×2): qty 1.5

## 2016-07-19 MED ORDER — LACTATED RINGERS IV SOLN
INTRAVENOUS | Status: DC | PRN
  Administered 2016-07-19 (×2): via INTRAVENOUS

## 2016-07-19 MED ORDER — PHENYLEPHRINE HCL 10 MG/ML IJ SOLN
INTRAVENOUS | Status: DC | PRN
  Administered 2016-07-19: 10:00:00 via INTRAVENOUS
  Administered 2016-07-19: 25 ug/min via INTRAVENOUS

## 2016-07-19 MED ORDER — FENTANYL CITRATE (PF) 100 MCG/2ML IJ SOLN
INTRAMUSCULAR | Status: DC | PRN
  Administered 2016-07-19 (×10): 50 ug via INTRAVENOUS

## 2016-07-19 MED ORDER — FENTANYL 40 MCG/ML IV SOLN
INTRAVENOUS | Status: AC
  Filled 2016-07-19: qty 25

## 2016-07-19 SURGICAL SUPPLY — 103 items
ADH SKN CLS APL DERMABOND .7 (GAUZE/BANDAGES/DRESSINGS) ×2
APL SRG 22X2 LUM MLBL SLNT (VASCULAR PRODUCTS)
APL SRG 7X2 LUM MLBL SLNT (VASCULAR PRODUCTS)
APPLICATOR TIP COSEAL (VASCULAR PRODUCTS) IMPLANT
APPLICATOR TIP EXT COSEAL (VASCULAR PRODUCTS) IMPLANT
BAG SPEC RTRVL LRG 6X4 10 (ENDOMECHANICALS) ×2
BLADE SURG 11 STRL SS (BLADE) ×1 IMPLANT
BRUSH CYTOL CELLEBRITY 1.5X140 (MISCELLANEOUS) IMPLANT
CANISTER SUCTION 2500CC (MISCELLANEOUS) ×3 IMPLANT
CATH KIT ON Q 5IN SLV (PAIN MANAGEMENT) IMPLANT
CATH KIT ON-Q SILVERSOAK 10 (CATHETERS) IMPLANT
CATH KIT ON-Q SILVERSOAK 5 (CATHETERS) IMPLANT
CATH KIT ON-Q SILVERSOAK 5IN (CATHETERS) ×3 IMPLANT
CATH KIT ON-Q SILVERSOAKER 10 (CATHETERS) ×3 IMPLANT
CATH THORACIC 28FR (CATHETERS) ×1 IMPLANT
CATH THORACIC 36FR (CATHETERS) IMPLANT
CATH THORACIC 36FR RT ANG (CATHETERS) IMPLANT
CLIP TI MEDIUM 6 (CLIP) ×3 IMPLANT
CONN ST 1/4X3/8  BEN (MISCELLANEOUS)
CONN ST 1/4X3/8 BEN (MISCELLANEOUS) IMPLANT
CONT SPEC 4OZ CLIKSEAL STRL BL (MISCELLANEOUS) ×14 IMPLANT
COVER MAYO STAND STRL (DRAPES) ×1 IMPLANT
COVER TABLE BACK 60X90 (DRAPES) ×3 IMPLANT
DERMABOND ADVANCED (GAUZE/BANDAGES/DRESSINGS) ×1
DERMABOND ADVANCED .7 DNX12 (GAUZE/BANDAGES/DRESSINGS) IMPLANT
DRAIN CHANNEL 28F RND 3/8 FF (WOUND CARE) ×1 IMPLANT
DRAIN CHANNEL 32F RND 10.7 FF (WOUND CARE) IMPLANT
DRAPE LAPAROSCOPIC ABDOMINAL (DRAPES) ×3 IMPLANT
DRAPE WARM FLUID 44X44 (DRAPE) ×3 IMPLANT
DRILL BIT 7/64X5 (BIT) IMPLANT
ELECT BLADE 4.0 EZ CLEAN MEGAD (MISCELLANEOUS) ×3
ELECT BLADE 6.5 EXT (BLADE) ×1 IMPLANT
ELECT REM PT RETURN 9FT ADLT (ELECTROSURGICAL) ×3
ELECTRODE BLDE 4.0 EZ CLN MEGD (MISCELLANEOUS) ×2 IMPLANT
ELECTRODE REM PT RTRN 9FT ADLT (ELECTROSURGICAL) ×2 IMPLANT
FORCEPS BIOP RJ4 1.8 (CUTTING FORCEPS) IMPLANT
GAUZE SPONGE 4X4 12PLY STRL (GAUZE/BANDAGES/DRESSINGS) ×3 IMPLANT
GLOVE BIO SURGEON STRL SZ 6.5 (GLOVE) ×7 IMPLANT
GLOVE BIOGEL PI IND STRL 6.5 (GLOVE) IMPLANT
GLOVE BIOGEL PI INDICATOR 6.5 (GLOVE) ×3
GLOVE SURG SS PI 7.0 STRL IVOR (GLOVE) ×1 IMPLANT
GOWN STRL REUS W/ TWL LRG LVL3 (GOWN DISPOSABLE) ×8 IMPLANT
GOWN STRL REUS W/TWL LRG LVL3 (GOWN DISPOSABLE) ×15
KIT BASIN OR (CUSTOM PROCEDURE TRAY) ×3 IMPLANT
KIT CLEAN ENDO COMPLIANCE (KITS) ×3 IMPLANT
KIT ROOM TURNOVER OR (KITS) ×3 IMPLANT
KIT SUCTION CATH 14FR (SUCTIONS) ×3 IMPLANT
MARKER SKIN DUAL TIP RULER LAB (MISCELLANEOUS) IMPLANT
NDL BIOPSY TRANSBRONCH 21G (NEEDLE) IMPLANT
NEEDLE BIOPSY TRANSBRONCH 21G (NEEDLE) IMPLANT
NS IRRIG 1000ML POUR BTL (IV SOLUTION) ×12 IMPLANT
OIL SILICONE PENTAX (PARTS (SERVICE/REPAIRS)) ×3 IMPLANT
PACK CHEST (CUSTOM PROCEDURE TRAY) ×3 IMPLANT
PAD ARMBOARD 7.5X6 YLW CONV (MISCELLANEOUS) ×9 IMPLANT
PASSER SUT SWANSON 36MM LOOP (INSTRUMENTS) IMPLANT
POUCH ENDO CATCH II 15MM (MISCELLANEOUS) ×1 IMPLANT
POUCH SPECIMEN RETRIEVAL 10MM (ENDOMECHANICALS) ×1 IMPLANT
RELOAD GREEN ECHELON 45 (STAPLE) ×1 IMPLANT
RELOAD STAPLE 35X2.5 WHT THIN (STAPLE) IMPLANT
RELOAD STAPLE 45 GOLD REG/THCK (STAPLE) IMPLANT
SCISSORS LAP 5X35 DISP (ENDOMECHANICALS) IMPLANT
SEALANT SURG COSEAL 4ML (VASCULAR PRODUCTS) IMPLANT
SEALANT SURG COSEAL 8ML (VASCULAR PRODUCTS) ×1 IMPLANT
SOLUTION ANTI FOG 6CC (MISCELLANEOUS) ×3 IMPLANT
SPONGE GAUZE 4X4 12PLY STER LF (GAUZE/BANDAGES/DRESSINGS) ×1 IMPLANT
SPONGE INTESTINAL PEANUT (DISPOSABLE) ×1 IMPLANT
STAPLE RELOAD 2.5MM WHITE (STAPLE) ×12 IMPLANT
STAPLE RELOAD 45MM GOLD (STAPLE) ×54 IMPLANT
STAPLER ECHELON POWERED (MISCELLANEOUS) ×1 IMPLANT
STAPLER VASCULAR ECHELON 35 (CUTTER) ×1 IMPLANT
SUT PROLENE 3 0 SH DA (SUTURE) IMPLANT
SUT PROLENE 4 0 RB 1 (SUTURE)
SUT PROLENE 4-0 RB1 .5 CRCL 36 (SUTURE) IMPLANT
SUT SILK  1 MH (SUTURE) ×6
SUT SILK 1 MH (SUTURE) ×8 IMPLANT
SUT SILK 1 TIES 10X30 (SUTURE) IMPLANT
SUT SILK 2 0 SH (SUTURE) ×1 IMPLANT
SUT SILK 2 0SH CR/8 30 (SUTURE) IMPLANT
SUT SILK 3 0SH CR/8 30 (SUTURE) IMPLANT
SUT STEEL 1 (SUTURE) IMPLANT
SUT VIC AB 0 CTX 18 (SUTURE) ×3 IMPLANT
SUT VIC AB 1 CTX 18 (SUTURE) IMPLANT
SUT VIC AB 1 CTX 36 (SUTURE) ×3
SUT VIC AB 1 CTX36XBRD ANBCTR (SUTURE) IMPLANT
SUT VIC AB 2-0 CTX 36 (SUTURE) IMPLANT
SUT VIC AB 2-0 UR6 27 (SUTURE) IMPLANT
SUT VIC AB 3-0 SH 8-18 (SUTURE) ×1 IMPLANT
SUT VIC AB 3-0 X1 27 (SUTURE) ×2 IMPLANT
SUT VICRYL 0 UR6 27IN ABS (SUTURE) IMPLANT
SUT VICRYL 2 TP 1 (SUTURE) ×1 IMPLANT
SWAB COLLECTION DEVICE MRSA (MISCELLANEOUS) IMPLANT
SYR 20ML ECCENTRIC (SYRINGE) ×3 IMPLANT
SYSTEM SAHARA CHEST DRAIN ATS (WOUND CARE) ×3 IMPLANT
TAPE CLOTH SURG 4X10 WHT LF (GAUZE/BANDAGES/DRESSINGS) ×1 IMPLANT
TAPE UMBILICAL COTTON 1/8X30 (MISCELLANEOUS) ×3 IMPLANT
TOWEL OR 17X24 6PK STRL BLUE (TOWEL DISPOSABLE) ×6 IMPLANT
TOWEL OR 17X26 10 PK STRL BLUE (TOWEL DISPOSABLE) ×6 IMPLANT
TRAP SPECIMEN MUCOUS 40CC (MISCELLANEOUS) IMPLANT
TRAY FOLEY CATH 16FRSI W/METER (SET/KITS/TRAYS/PACK) ×3 IMPLANT
TUBE ANAEROBIC SPECIMEN COL (MISCELLANEOUS) IMPLANT
TUBE CONNECTING 20X1/4 (TUBING) ×3 IMPLANT
TUNNELER SHEATH ON-Q 11GX8 DSP (PAIN MANAGEMENT) IMPLANT
WATER STERILE IRR 1000ML POUR (IV SOLUTION) ×6 IMPLANT

## 2016-07-19 NOTE — Anesthesia Preprocedure Evaluation (Signed)
Anesthesia Evaluation  Patient identified by MRN, date of birth, ID band Patient awake    Reviewed: Allergy & Precautions, NPO status , Patient's Chart, lab work & pertinent test results  Airway Mallampati: II  TM Distance: >3 FB Neck ROM: Full    Dental  (+) Edentulous Upper, Edentulous Lower   Pulmonary Current Smoker,     + decreased breath sounds      Cardiovascular  Rhythm:Regular Rate:Normal     Neuro/Psych    GI/Hepatic   Endo/Other    Renal/GU      Musculoskeletal   Abdominal   Peds  Hematology   Anesthesia Other Findings   Reproductive/Obstetrics                             Anesthesia Physical Anesthesia Plan  ASA: III  Anesthesia Plan: General   Post-op Pain Management:    Induction: Intravenous  Airway Management Planned: Double Lumen EBT  Additional Equipment: Arterial line and CVP  Intra-op Plan:   Post-operative Plan: Extubation in OR  Informed Consent: I have reviewed the patients History and Physical, chart, labs and discussed the procedure including the risks, benefits and alternatives for the proposed anesthesia with the patient or authorized representative who has indicated his/her understanding and acceptance.     Plan Discussed with: CRNA and Anesthesiologist  Anesthesia Plan Comments:         Anesthesia Quick Evaluation

## 2016-07-19 NOTE — H&P (Signed)
[address]       [place] [address]             [phone]                    Vadhir Todd Bey Zemple Medical Record #[id number] Date of Birth: 15-Jan-1951  Referring: Dr Glade Lloyd [place] [facility]: Pcp Not In System  Chief Complaint:    Lung Mass   History of Present Illness:    [name] 66 y.o. male is seen in the office for right lung mass. He is long term smoker1-2  ppd for 40 years. He presented to urgent care with increased cough and fever in December.  He was treated for copd excerebration and pneumonia. He noted improvement with antibiotics, inhalers and steroids.Follow up  up chest xray showed right lung mass. Follow ct and PET scan done in [place] ( under name [name]) suggestive of 2.5 cm right upper lobe hypermetabolic   mass highly suggestive of primary lung cancer. He is referred for consideration of surgical resection.      CPX testing has been done and Cardiolite stress testing - completed     Current Activity/ Functional Status:  Patient is independent with mobility/ambulation, transfers, ADL's, IADL's.   Zubrod Score: At the time of surgery this patient's most appropriate activity status/level should be described as: '[]'$     0    Normal activity, no symptoms '[x]'$     1    Restricted in physical strenuous activity but ambulatory, able to do out light work- works full time  '[]'$     2    Ambulatory and capable of self care, unable to do work activities, up and about               >50 % of waking hours                              '[]'$     3    Only limited self care, in bed greater than 50% of waking hours '[]'$     4    Completely disabled, no self care, confined to bed or chair '[]'$     5    Moribund   Past Medical History:  Diagnosis Date  . Arthritis   . Cancer [facility])    pt states possible lung cancer to right lung  . COPD (chronic obstructive pulmonary disease) ([place])   . Dyspnea    with exertion  . Head injury    a. remotely with  cranial plate in place.  . Lung mass    a. @ [facility] - 2.5 cm right upper lobe hypermetabolic mass highly suggestive of primary lung cancer.   . Tobacco abuse     Past Surgical History:  Procedure Laterality Date  . CARPAL TUNNEL RELEASE Right   . COLONOSCOPY    . ELBOW SURGERY     surgery x2 on right elbow  . EYE SURGERY     eye socket fracture, metal plate on left side of face  . HAND SURGERY     multiple hand surgeries on right hand  . SKIN GRAFT     to right hand   head injury yars ago with cranial plate in place.  Family history: Father died prostate cancer age 73 Mother died age 88 brain aneurysm One bother 2 sisters  diabeties  Social History  Social History  . Marital status: Married    Spouse name: N/A  . Number of children: N/A  . Years of education: N/A   Occupational History  . [employer] manager at [employer] , works full time   Social History Main Topics  . Smoking status: Current Every Day Smoker    Packs/day: 1.00    Years: 50.00    Types: Cigarettes  . Smokeless tobacco: Never Used  . Alcohol use No  . Drug use: No  . Sexual activity: Not on file     History  Smoking Status  . Current Every Day Smoker  . Packs/day: 1.00  . Years: 50.00  . Types: Cigarettes  Smokeless Tobacco  . Never Used    History  Alcohol Use No     No Known Allergies  Current Facility-Administered Medications  Medication Dose Route Frequency Provider Last Rate Last Dose  . cefUROXime (ZINACEF) 1.5 g in dextrose 5 % 50 mL IVPB  1.5 g Intravenous 60 min Pre-Op Ivin Poot, MD          Review of Systems:     Cardiac Review of Systems: Y or N  Chest Pain [ n   ]  Resting SOB [ n  ] Exertional SOB  [ y ]  Orthopnea [ n ]   Pedal Edema [ n  ]    Palpitations [n  ] Syncope  [n  ]   Presyncope [ n  ]  General Review of Systems: [Y] = yes [  ]=no Constitional: recent weight change [ n ];  Wt loss over the last 3 months [   ] anorexia [  ]; fatigue [  ]; nausea [   ]; night sweats [  ]; fever [ n ]; or chills [n  ];          Dental: poor dentition[y dentures  ]; Last Dentist visit:   Eye : blurred vision [  ]; diplopia [   ]; vision changes [  ];  Amaurosis fugax[  ]; Resp: cough Blue.Reese  ];  wheezing[y  ];  hemoptysis[n  ]; shortness of breath[ n ]; paroxysmal nocturnal dyspnea[ n ]; dyspnea on exertion[y  ]; or orthopnea[  ];  GI:  gallstones[  ], vomiting[  ];  dysphagia[  ]; melena[  ];  hematochezia [  ]; heartburn[  ];   Hx of  Colonoscopy[ n ]; GU: kidney stones [  ]; hematuria[  ];   dysuria [  ];  nocturia[  ];  history of     obstruction [  ]; urinary frequency [  ]             Skin: rash, swelling[  ];, hair loss[  ];  peripheral edema[  ];  or itching[  ]; Musculosketetal: myalgias[  ];  joint swelling[  ];  joint erythema[  ];  joint pain[  ];  back pain[  ];  Heme/Lymph: bruising[  ];  bleeding[  ];  anemia[  ];  Neuro: TIA[  ];  headaches[  ];  stroke[  ];  vertigo[  ];  seizures[  ];   paresthesias[  ];  difficulty walking[  ];  Psych:depression[  ]; anxiety[  ];  Endocrine: diabetes[N  ];  thyroid dysfunction[  ];  Immunizations: Flu up to date [ N ]; Pneumococcal up to date [ N ];  Other:  Physical Exam: BP (!) 143/77   Pulse 61   Temp  97.6 F (36.4 C)   Resp 20   Wt 160 lb (72.6 kg)   SpO2 100%   BMI 21.70 kg/m   PHYSICAL EXAMINATION: General appearance: alert, cooperative, appears older than stated age and no distress Head: Normocephalic, without obvious abnormality, atraumatic Neck: no adenopathy, no carotid bruit, no JVD, supple, symmetrical, trachea midline and thyroid not enlarged, symmetric, no tenderness/mass/nodules Lymph nodes: Cervical, supraclavicular, and axillary nodes normal. Resp: clear to auscultation bilaterally Back: symmetric, no curvature. ROM normal. No CVA tenderness. Cardio: regular rate and rhythm, S1, S2 normal, no murmur, click, rub or gallop GI: soft, non-tender; bowel sounds normal; no masses,  no  organomegaly Extremities: extremities normal, atraumatic, no cyanosis or edema and clubbing of fingers with nicotine stains Neurologic: Grossly normal  Diagnostic Studies & Laboratory data:     Recent Radiology Findings:   see pet and ct of chest in pac system under name [name], [name] 2.4 cm right upper lobe mass with evidence of emphysemic pulmonary changes  I have independently reviewed the above radiology studies  and reviewed the findings with the patient.  Cardiolite:  Nuclear stress EF: 57%. No wall motion abnormalities  There was no ST segment deviation noted during stress. No perfusion defects.  Low risk study, no ischemia.   Aaron Furbish, MD    PFT's  FEV1 1.75  49% DLC0 11.6 55%  Referred for: Risk Stratification for Lung Resection  Procedure: This patient underwent staged symptom-limited exercise testing using a individualized bike protocol with expired gas analysis metabolic evaluation during exercise.  Demographics  Age: 54 Ht. (in.) 31 Wt. (lb) 156 BMI: 21.2   Predicted Peak VO2: 26.2   Gender: Male Ht (cm) 182.9 Wt. (kg) 70.8    Results  Pre-Exercise PFTs   FVC 4.19 (88%)     FEV1 1.95 (54%)      FEV1/FVC 47 (61%)      MVV 89 (64%)     Exercise Time:  10:13   Watts: 100  RPE: 17  Reason stopped: patient ended test due to leg fatigue  Additional symptoms: Dyspnea (7/10)  Resting HR: 67 Peak HR: 130  (84% age predicted max HR)  BP rest: 102/68 BP peak: 192/80  Peak VO2: 19.9 (76% predicted peak VO2)  VE/VCO2 slope: 46  OUES: 1.53  Peak RER: 1.13  Ventilatory Threshold: 16.3 (62% predicted or measured peak VO2)  Peak RR 56  Peak Ventilation: 82  VE/MVV: 92%  PETCO2 at peak: 24  O2pulse: 11  (92% predicted O2pulse)   Interpretation  Notes: Patient gave a very good effort. Pulse-oximetry remained 96% or above throughout exercise. Exercise was performed on a cycle ergometer starting at [facility] and  increasing by 10W/min.   ECG: Resting ECG in normal sinus rhythm. HR response mildly blunted. There were occasional PVCs but no sustained arrhythmias and no significant ST-Todd changes. BP mildly hypertensive at peak exercise.  PFT: Pre-exercise spirometry demonstrates moderate obstruction. MVV below normal.   CPX: Exercise testing with gas exchange demonstrates a mildly reduced peak VO2 of 19.9 ml/kg/min (76% of the age/gender/weight matched sedentary norms). The RER of 1.13 indicates a maximal effort. The VE/VCO2 slope is significantly elevated and indicates excessive dead space ventilation. The oxygen uptake efficiency slope (OUES) is reduced. The VO2 at the ventilatory threshold was normal at 16.3% of the predicted peak VO2. At peak exercise, the ventilation reached 92% of the measured MVV indicating ventilatory limits were nearly approached. The O2pulse (a surrogate for stroke volume) increased mildly  with exercise intensity reaching peak at 11 ml/beat (92% predicted).    Conclusion: Exercise testing with gas exchange demonstrates mild functional impairment when compared to matched sedentary norms. No clear cardiovascular limitation. Obstructive lung patterns demonstrated with pre-exercise spirometry and high VE/MVV ratio indicate primary ventilatory limitation. VE/VCO2 slope also significantly elevated indicating excessive dead space ventilation most likely resulting from pulmonary obstructive physiology, Although PVO2 reduced (19.9 ml/kg/min, 76% predicted), there is little increased risk associated with lung resection. Note patient was mildly hypertensive at peak exercise.    Test, report and preliminary impression by: Aaron Martins, MS, ACSM-RCEP 07/12/2016 12:37 PM  CPX testing shows only mildly reduced functional capacity due to ventilatory limitation. Spirometry reflect moderate obstructive lung disease. Cannot exclude a component of circulatory limitation with high VE/VCO2 slope and  reduced OUES but pulmonary limitation predominates. Exercise ECG without evidence of ischemia.   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 CPX testing with V02 max 19.9   I have reviewed with the patient and wife, treatment options for what is clinical stage 1 lung cancer of the right upper lobe. He has cut smoking back to 5 cig a day. He is agreeable with recommendation to proceed with surgical resection of his right lung lesion for dx and rx. Will plan to precede with bronchoscopy, right VATS, lung resection . Cardiology clearance has been obtained. He and his wife have had there questions answered   The goals risks and alternatives of the planned surgical procedure bronchoscopy, right VATS, lung resection   have been discussed with the patient in detail. The risks of the procedure including death, infection, stroke, myocardial infarction, bleeding, blood transfusion have all been discussed specifically.  I have quoted [name] a2 % of perioperative mortality and a complication rate as high as 40 %. The patient's questions have been answered.[name] is willing  to proceed with the planned procedure. Aaron Isaac MD      [address] [place] [address] Office [phone]   Beeper [phone]  07/19/2016 6:25 AM

## 2016-07-19 NOTE — Brief Op Note (Signed)
07/19/2016  1:01 PM  PATIENT:  [name]  66 y.o. male  PRE-OPERATIVE DIAGNOSIS:  RIGHT LUNG LESION  POST-OPERATIVE DIAGNOSIS:  RIGHT LUNG LESION  PROCEDURE:  Procedure(s): VIDEO BRONCHOSCOPY (N/A) VIDEO ASSISTED THORACOSCOPY (VATS) (Right) WEDGE  RESECTION RIGHT UPPER LOBE (Right) LOBECTOMY RIGHT UPPER LOBE WITH WEDGE RESECTION SUPERIOR SEGMENT RIGHT LOWER LOBE WITH PLACEMENT OF ON-Q (Right) LYMPH NODE DISSECTION (Right)  SURGEON:  Surgeon(s) and Role:    * [name], MD - Primary  PHYSICIAN ASSISTANT:  Nicholes Rough, PA-C   ANESTHESIA:   general  EBL:  Total I/O In: 1000 [I.V.:1000] Out: 1025 [Urine:825; Blood:200]  BLOOD ADMINISTERED:none  DRAINS: STRIAGHT CHEST TUBE AND BLAKE DRAIN   LOCAL MEDICATIONS USED:  OTHER ON-Q  SPECIMEN:  Source of Specimen:  wedge resection and right upper lobe  DISPOSITION OF SPECIMEN:  PATHOLOGY  COUNTS:  YES  TOURNIQUET:  * No tourniquets in log *  DICTATION: .Dragon Dictation  PLAN OF CARE: Admit to inpatient   PATIENT DISPOSITION:  ICU - extubated and stable.   Delay start of Pharmacological VTE agent (>24hrs) due to surgical blood loss or risk of bleeding: yes

## 2016-07-19 NOTE — Anesthesia Procedure Notes (Addendum)
Central Venous Catheter Insertion Performed by: Roberts Gaudy, anesthesiologist Start/End1/16/2018 7:10 AM, 07/19/2016 7:15 AM Preanesthetic checklist: patient identified, IV checked, site marked, risks and benefits discussed, surgical consent, monitors and equipment checked, pre-op evaluation and timeout performed Position: Trendelenburg Hand hygiene performed , maximum sterile barriers used  and Seldinger technique used Catheter size: 8 Fr Total catheter length 17. Central line was placed.Double lumen Procedure performed without using ultrasound guided technique. Ultrasound Notes:anatomy identified Attempts: 1 Following insertion, line sutured, dressing applied and Biopatch. Post procedure assessment: blood return through all ports, free fluid flow and no air  Patient tolerated the procedure well with no immediate complications.

## 2016-07-19 NOTE — Transfer of Care (Signed)
Immediate Anesthesia Transfer of Care Note  Patient: [name]  Procedure(s) Performed: Procedure(s): VIDEO BRONCHOSCOPY (N/A) VIDEO ASSISTED THORACOSCOPY (VATS) (Right) WEDGE  RESECTION RIGHT UPPER LOBE (Right) LOBECTOMY RIGHT UPPER LOBE WITH WEDGE RESECTION SUPERIOR SEGMENT RIGHT LOWER LOBE WITH PLACEMENT OF ON-Q (Right) LYMPH NODE DISSECTION (Right)  Patient Location: PACU  Anesthesia Type:General  Level of Consciousness: awake and alert   Airway & Oxygen Therapy: Patient Spontanous Breathing and Patient connected to face mask oxygen  Post-op Assessment: Report given to RN and Post -op Vital signs reviewed and stable  Post vital signs: Reviewed and stable  Last Vitals:  Vitals:   07/19/16 0723 07/19/16 1324  BP:    Pulse: 60   Resp:    Temp:  (!) 36 C    Last Pain: There were no vitals filed for this visit.    Patients Stated Pain Goal: 3 (79/43/27 6147)  Complications: No apparent anesthesia complications

## 2016-07-19 NOTE — Anesthesia Procedure Notes (Addendum)
Procedure Name: Intubation Date/Time: 07/19/2016 7:45 AM Performed by: Candis Shine Pre-anesthesia Checklist: Patient identified, Emergency Drugs available, Suction available and Patient being monitored Patient Re-evaluated:Patient Re-evaluated prior to inductionOxygen Delivery Method: Circle System Utilized Preoxygenation: Pre-oxygenation with 100% oxygen Intubation Type: IV induction Ventilation: Oral airway inserted - appropriate to patient size, Mask ventilation without difficulty and Two handed mask ventilation required Laryngoscope Size: Mac and 4 Grade View: Grade I Tube type: Oral Tube size: 8.0 mm Number of attempts: 1 Airway Equipment and Method: Stylet and Oral airway Placement Confirmation: ETT inserted through vocal cords under direct vision,  positive ETCO2 and breath sounds checked- equal and bilateral Secured at: 22 cm Tube secured with: Tape Dental Injury: Teeth and Oropharynx as per pre-operative assessment

## 2016-07-19 NOTE — Anesthesia Postprocedure Evaluation (Addendum)
Anesthesia Post Note  Patient: [name]  Procedure(s) Performed: Procedure(s) (LRB): VIDEO BRONCHOSCOPY (N/A) VIDEO ASSISTED THORACOSCOPY (VATS) (Right) WEDGE  RESECTION RIGHT UPPER LOBE (Right) LOBECTOMY RIGHT UPPER LOBE WITH WEDGE RESECTION SUPERIOR SEGMENT RIGHT LOWER LOBE WITH PLACEMENT OF ON-Q (Right) LYMPH NODE DISSECTION (Right)  Patient location during evaluation: PACU Anesthesia Type: General Level of consciousness: awake, awake and alert and oriented Pain management: pain level controlled Vital Signs Assessment: post-procedure vital signs reviewed and stable Respiratory status: spontaneous breathing, nonlabored ventilation and respiratory function stable Cardiovascular status: blood pressure returned to baseline Anesthetic complications: no       Last Vitals:  Vitals:   07/19/16 1800 07/19/16 1851  BP: 117/69   Pulse: 66   Resp: (!) 9 17  Temp:      Last Pain:  Vitals:   07/19/16 1851  PainSc: Asleep                 Palmer Shorey COKER

## 2016-07-19 NOTE — Anesthesia Procedure Notes (Signed)
Procedure Name: Intubation Date/Time: 07/19/2016 8:01 AM Performed by: Candis Shine Pre-anesthesia Checklist: Patient identified, Emergency Drugs available, Suction available and Patient being monitored Patient Re-evaluated:Patient Re-evaluated prior to inductionOxygen Delivery Method: Circle System Utilized Preoxygenation: Pre-oxygenation with 100% oxygen Laryngoscope Size: Mac and 3 Grade View: Grade I Endobronchial tube: Double lumen EBT, Left, EBT position confirmed by auscultation and EBT position confirmed by fiberoptic bronchoscope and 37 Fr Number of attempts: 2 Airway Equipment and Method: Stylet Placement Confirmation: ETT inserted through vocal cords under direct vision,  positive ETCO2 and breath sounds checked- equal and bilateral Tube secured with: Tape Dental Injury: Teeth and Oropharynx as per pre-operative assessment

## 2016-07-20 ENCOUNTER — Inpatient Hospital Stay (HOSPITAL_COMMUNITY): Payer: Managed Care, Other (non HMO)

## 2016-07-20 ENCOUNTER — Encounter (HOSPITAL_COMMUNITY): Payer: Self-pay | Admitting: Cardiothoracic Surgery

## 2016-07-20 LAB — GLUCOSE, CAPILLARY
Glucose-Capillary: 103 mg/dL — ABNORMAL HIGH (ref 65–99)
Glucose-Capillary: 104 mg/dL — ABNORMAL HIGH (ref 65–99)
Glucose-Capillary: 109 mg/dL — ABNORMAL HIGH (ref 65–99)
Glucose-Capillary: 110 mg/dL — ABNORMAL HIGH (ref 65–99)
Glucose-Capillary: 112 mg/dL — ABNORMAL HIGH (ref 65–99)
Glucose-Capillary: 156 mg/dL — ABNORMAL HIGH (ref 65–99)

## 2016-07-20 LAB — CBC
HEMATOCRIT: 42.4 % (ref 39.0–52.0)
Hemoglobin: 13.9 g/dL (ref 13.0–17.0)
MCH: 30.3 pg (ref 26.0–34.0)
MCHC: 32.8 g/dL (ref 30.0–36.0)
MCV: 92.6 fL (ref 78.0–100.0)
Platelets: 171 10*3/uL (ref 150–400)
RBC: 4.58 MIL/uL (ref 4.22–5.81)
RDW: 15.2 % (ref 11.5–15.5)
WBC: 16.9 10*3/uL — ABNORMAL HIGH (ref 4.0–10.5)

## 2016-07-20 LAB — BASIC METABOLIC PANEL
Anion gap: 8 (ref 5–15)
BUN: 6 mg/dL (ref 6–20)
CALCIUM: 9 mg/dL (ref 8.9–10.3)
CO2: 24 mmol/L (ref 22–32)
Chloride: 104 mmol/L (ref 101–111)
Creatinine, Ser: 0.74 mg/dL (ref 0.61–1.24)
GLUCOSE: 121 mg/dL — AB (ref 65–99)
Potassium: 3.9 mmol/L (ref 3.5–5.1)
SODIUM: 136 mmol/L (ref 135–145)

## 2016-07-20 LAB — BLOOD GAS, ARTERIAL
Acid-base deficit: 1.4 mmol/L (ref 0.0–2.0)
Bicarbonate: 22.8 mmol/L (ref 20.0–28.0)
DRAWN BY: 41977
O2 CONTENT: 2 L/min
O2 SAT: 94.7 %
PATIENT TEMPERATURE: 98.6
pCO2 arterial: 38.4 mmHg (ref 32.0–48.0)
pH, Arterial: 7.392 (ref 7.350–7.450)
pO2, Arterial: 74.6 mmHg — ABNORMAL LOW (ref 83.0–108.0)

## 2016-07-20 MED ORDER — ENOXAPARIN SODIUM 30 MG/0.3ML ~~LOC~~ SOLN
30.0000 mg | SUBCUTANEOUS | Status: DC
  Administered 2016-07-20 – 2016-07-21 (×2): 30 mg via SUBCUTANEOUS
  Filled 2016-07-20 (×2): qty 0.3

## 2016-07-20 NOTE — Progress Notes (Signed)
Patient ID: [name], male   DOB: June 29, 1951, 66 y.o.   MRN: [id number] TCTS DAILY ICU PROGRESS NOTE                   [address]            [place] [address]          [id number]   1 Day Post-Op Procedure(s) (LRB): VIDEO BRONCHOSCOPY (N/A) VIDEO ASSISTED THORACOSCOPY (VATS) (Right) WEDGE  RESECTION RIGHT UPPER LOBE (Right) LOBECTOMY RIGHT UPPER LOBE WITH WEDGE RESECTION SUPERIOR SEGMENT RIGHT LOWER LOBE WITH PLACEMENT OF ON-Q (Right) LYMPH NODE DISSECTION (Right)  Total Length of Stay:  LOS: 1 day   Subjective: Up to chair with out distress  Objective: Vital signs in last 24 hours: Temp:  [96.8 F (36 C)-99.4 F (37.4 C)] 98.2 F (36.8 C) (01/17 0700) Pulse Rate:  [54-83] 64 (01/17 0700) Cardiac Rhythm: Normal sinus rhythm;Sinus bradycardia (01/17 0800) Resp:  [7-26] 18 (01/17 0800) BP: (95-127)/(50-94) 107/50 (01/17 0700) SpO2:  [91 %-100 %] 97 % (01/17 0837) Arterial Line BP: (77-166)/(52-79) 77/62 (01/16 2100) Weight:  [159 lb 13.3 oz (72.5 kg)] 159 lb 13.3 oz (72.5 kg) (01/16 1539)  Filed Weights   07/19/16 0554 07/19/16 1539  Weight: 160 lb (72.6 kg) 159 lb 13.3 oz (72.5 kg)    Weight change: -2.7 oz (-0.075 kg)   Hemodynamic parameters for last 24 hours:    Intake/Output from previous day: 01/16 0701 - 01/17 0700 In: 4125 [P.O.:720; I.V.:3355; IV Piggyback:50] Out: 8469 [Urine:3550; Blood:200; Chest Tube:185]  Intake/Output this shift: Total I/O In: 100 [I.V.:100] Out: 90 [Urine:70; Chest Tube:20]  Current Meds: Scheduled Meds: . acetaminophen  1,000 mg Oral Q6H   Or  . acetaminophen (TYLENOL) oral liquid 160 mg/5 mL  1,000 mg Oral Q6H  . bisacodyl  10 mg Oral Daily  . bupivacaine 0.5 % ON-Q pump SINGLE CATH 400 mL  400 mL Other To OR  . fentaNYL   Intravenous Q4H  . insulin aspart  0-24 Units Subcutaneous Q4H  . mometasone-formoterol  2 puff Inhalation BID  . senna-docusate  1 tablet Oral QHS  . tiotropium  1 capsule  Inhalation Daily   Continuous Infusions: . dextrose 5 % and 0.45% NaCl 100 mL/hr at 07/20/16 0700   PRN Meds:.albuterol, diphenhydrAMINE **OR** diphenhydrAMINE, naloxone **AND** sodium chloride flush, ondansetron (ZOFRAN) IV, oxyCODONE, potassium chloride (KCL MULTIRUN) 30 mEq in 265 mL IVPB, traMADol  General appearance: alert and cooperative Neurologic: intact Heart: regular rate and rhythm, S1, S2 normal, no murmur, click, rub or gallop Lungs: diminished breath sounds bibasilar Abdomen: soft, non-tender; bowel sounds normal; no masses,  no organomegaly Extremities: extremities normal, atraumatic, no cyanosis or edema and Homans sign is negative, no sign of DVT Wound: air leak with cough  Lab Results: CBC: Recent Labs  07/20/16 0615  WBC 16.9*  HGB 13.9  HCT 42.4  PLT 171   BMET:  Recent Labs  07/20/16 0615  NA 136  K 3.9  CL 104  CO2 24  GLUCOSE 121*  BUN 6  CREATININE 0.74  CALCIUM 9.0    CMET: Lab Results  Component Value Date   WBC 16.9 (H) 07/20/2016   HGB 13.9 07/20/2016   HCT 42.4 07/20/2016   PLT 171 07/20/2016   GLUCOSE 121 (H) 07/20/2016   CHOL 243 (H) 07/11/2016   TRIG 102 07/11/2016   HDL 75 07/11/2016   LDLCALC 148 (H) 07/11/2016   ALT 10 (  L) 07/15/2016   AST 14 (L) 07/15/2016   NA 136 07/20/2016   K 3.9 07/20/2016   CL 104 07/20/2016   CREATININE 0.74 07/20/2016   BUN 6 07/20/2016   CO2 24 07/20/2016   INR 0.94 07/15/2016      PT/INR: No results for input(s): LABPROT, INR in the last 72 hours. Radiology: Dg Chest Port 1 View  Result Date: 07/20/2016 CLINICAL DATA:  Right upper lobectomy with wedge resection of the superior segment of the right lower lobe. EXAM: PORTABLE CHEST 1 VIEW COMPARISON:  07/19/2016 FINDINGS: There is a 15% right pneumothorax with increased subcutaneous emphysema on the right. Two right-sided chest tubes in place. No mediastinal shift. Chronic slight accentuation of the markings at the left lung base. Pulmonary  vascularity is normal. Right subclavian catheter tip extends into the right side of the neck. IMPRESSION: 15% right pneumothorax. Right subclavian catheter extends into the right internal jugular vein. Critical Value/emergent results were called by telephone at the time of interpretation on 07/20/2016 at 8:49 am to [facility], [place] , who verbally acknowledged these results. Electronically Signed   By: Lorriane Shire M.D.   On: 07/20/2016 08:49   Dg Chest Port 1 View  Result Date: 07/19/2016 CLINICAL DATA:  History of right upper lobectomy and superior segment right lower lobe resection low lung carcinoma, followup EXAM: PORTABLE CHEST 1 VIEW COMPARISON:  Preop chest x-ray of 07/15/2016 FINDINGS: There is opacity within the right upper lobe medially possibly related to surgery and either atelectasis or hematoma. A right chest tube is present and no pneumothorax is seen. There is volume loss on the right. Right IJ central venous line courses laterally toward the axillary vein. The left lung is clear. Heart size is stable. IMPRESSION: 1. Postop change in the right upper hemithorax. 2. Right IJ central venous line courses toward the right axillary vein. 3. Right chest tubes.  No pneumothorax. Electronically Signed   By: Ivar Drape M.D.   On: 07/19/2016 14:29     Assessment/Plan: S/P Procedure(s) (LRB): VIDEO BRONCHOSCOPY (N/A) VIDEO ASSISTED THORACOSCOPY (VATS) (Right) WEDGE  RESECTION RIGHT UPPER LOBE (Right) LOBECTOMY RIGHT UPPER LOBE WITH WEDGE RESECTION SUPERIOR SEGMENT RIGHT LOWER LOBE WITH PLACEMENT OF ON-Q (Right) LYMPH NODE DISSECTION (Right) extremities normal, atraumatic, no cyanosis or edema and Homans sign is negative, no sign of DVT Remove central line Remove a line     [name] 07/20/2016 9:09 AM

## 2016-07-20 NOTE — Care Management Note (Signed)
Case Management Note  Patient Details  Name: KOUA DEEG MRN: [id number] Date of Birth: September 18, 1950  Subjective/Objective: S/p VATS, wedge resection and lobectomy for suspected Lung CA               Action/Plan:   PTA independent from home with wife.  CM will continue to follow for discharge needs   Expected Discharge Date:                  Expected Discharge Plan:     In-House Referral:     Discharge planning Services  CM Consult  Post Acute Care Choice:    Choice offered to:     DME Arranged:    DME Agency:     HH Arranged:    HH Agency:     Status of Service:  In process, will continue to follow  If discussed at Long Length of Stay Meetings, dates discussed:    Additional Comments:  Maryclare Labrador, RN 07/20/2016, 10:42 AM

## 2016-07-20 NOTE — Progress Notes (Signed)
CT surgery p.m. Rounds  Doing well after lobectomy for cancer Pain controlled with PCA plus oral tramadol Minimal air leak from chest tubes Stable pulmonary status Continue current care

## 2016-07-20 NOTE — Progress Notes (Signed)
Audelia Acton RN will remove the central line. [employer]

## 2016-07-21 ENCOUNTER — Encounter (HOSPITAL_COMMUNITY): Payer: Self-pay

## 2016-07-21 ENCOUNTER — Inpatient Hospital Stay (HOSPITAL_COMMUNITY): Payer: Managed Care, Other (non HMO)

## 2016-07-21 LAB — GLUCOSE, CAPILLARY
Glucose-Capillary: 107 mg/dL — ABNORMAL HIGH (ref 65–99)
Glucose-Capillary: 112 mg/dL — ABNORMAL HIGH (ref 65–99)
Glucose-Capillary: 115 mg/dL — ABNORMAL HIGH (ref 65–99)
Glucose-Capillary: 121 mg/dL — ABNORMAL HIGH (ref 65–99)
Glucose-Capillary: 130 mg/dL — ABNORMAL HIGH (ref 65–99)
Glucose-Capillary: 133 mg/dL — ABNORMAL HIGH (ref 65–99)

## 2016-07-21 LAB — COMPREHENSIVE METABOLIC PANEL
ALBUMIN: 3.4 g/dL — AB (ref 3.5–5.0)
ALT: 17 U/L (ref 17–63)
AST: 30 U/L (ref 15–41)
Alkaline Phosphatase: 80 U/L (ref 38–126)
Anion gap: 7 (ref 5–15)
BUN: 5 mg/dL — AB (ref 6–20)
CHLORIDE: 103 mmol/L (ref 101–111)
CO2: 25 mmol/L (ref 22–32)
CREATININE: 0.76 mg/dL (ref 0.61–1.24)
Calcium: 8.9 mg/dL (ref 8.9–10.3)
GFR calc Af Amer: >60 mL/min (ref >60)
GFR calc non Af Amer: >60 mL/min (ref >60)
GLUCOSE: 104 mg/dL — AB (ref 65–99)
POTASSIUM: 4 mmol/L (ref 3.5–5.1)
SODIUM: 135 mmol/L (ref 135–145)
Total Bilirubin: 1.1 mg/dL (ref 0.3–1.2)
Total Protein: 6.5 g/dL (ref 6.5–8.1)

## 2016-07-21 LAB — CBC
HCT: 42.9 % (ref 39.0–52.0)
Hemoglobin: 14 g/dL (ref 13.0–17.0)
MCH: 30.3 pg (ref 26.0–34.0)
MCHC: 32.6 g/dL (ref 30.0–36.0)
MCV: 92.9 fL (ref 78.0–100.0)
PLATELETS: 162 10*3/uL (ref 150–400)
RBC: 4.62 MIL/uL (ref 4.22–5.81)
RDW: 15.4 % (ref 11.5–15.5)
WBC: 15.3 10*3/uL — AB (ref 4.0–10.5)

## 2016-07-21 LAB — URINALYSIS, ROUTINE W REFLEX MICROSCOPIC
Bilirubin Urine: NEGATIVE
Glucose, UA: NEGATIVE mg/dL
Ketones, ur: NEGATIVE mg/dL
Leukocytes, UA: NEGATIVE
Nitrite: NEGATIVE
Protein, ur: NEGATIVE mg/dL
Specific Gravity, Urine: 1.009 (ref 1.005–1.030)
Squamous Epithelial / LPF: NONE SEEN
pH: 6 (ref 5.0–8.0)

## 2016-07-21 MED ORDER — ENOXAPARIN SODIUM 40 MG/0.4ML ~~LOC~~ SOLN
40.0000 mg | SUBCUTANEOUS | Status: DC
  Administered 2016-07-22 – 2016-08-03 (×12): 40 mg via SUBCUTANEOUS
  Filled 2016-07-21 (×13): qty 0.4

## 2016-07-21 MED ORDER — TAMSULOSIN HCL 0.4 MG PO CAPS
0.4000 mg | ORAL_CAPSULE | Freq: Every day | ORAL | Status: DC
  Administered 2016-07-21 – 2016-08-03 (×14): 0.4 mg via ORAL
  Filled 2016-07-21 (×14): qty 1

## 2016-07-21 MED ORDER — LIDOCAINE HCL (PF) 1 % IJ SOLN
INTRAMUSCULAR | Status: AC
  Administered 2016-07-21: 11:00:00
  Filled 2016-07-21: qty 5

## 2016-07-21 NOTE — Progress Notes (Signed)
      [address]       [place] [address]             [phone]      POD # 2 Right upper lobectomy  BP 122/62   Pulse 77   Temp 97.6 F (36.4 C) (Oral)   Resp 14   Ht 6' (1.829 m)   Wt 159 lb 6.3 oz (72.3 kg)   SpO2 100%   BMI 21.62 kg/m    Intake/Output Summary (Last 24 hours) at 07/21/16 1941 Last data filed at 07/21/16 1900  Gross per 24 hour  Intake             3030 ml  Output             3265 ml  Net             -235 ml   Resting comfortably  Kamare Caspers C. Roxan Hockey, MD [facility] [phone]

## 2016-07-21 NOTE — Progress Notes (Signed)
Patient ID: [name], male   DOB: [birth year], 66 y.o.   MRN: [id number] TCTS DAILY ICU PROGRESS NOTE                   [address]            [place] [address]          [phone]   2 Days Post-Op Procedure(s) (LRB): VIDEO BRONCHOSCOPY (N/A) VIDEO ASSISTED THORACOSCOPY (VATS) (Right) WEDGE  RESECTION RIGHT UPPER LOBE (Right) LOBECTOMY RIGHT UPPER LOBE WITH WEDGE RESECTION SUPERIOR SEGMENT RIGHT LOWER LOBE WITH PLACEMENT OF ON-Q (Right) LYMPH NODE DISSECTION (Right)  Total Length of Stay:  LOS: 2 days   Subjective: Up in chair. Unable to void this am  Objective: Vital signs in last 24 hours: Temp:  [97.6 F (36.4 C)-98.6 F (37 C)] 98 F (36.7 C) (01/18 0855) Pulse Rate:  [59-81] 74 (01/18 0947) Cardiac Rhythm: Normal sinus rhythm (01/18 0700) Resp:  [13-29] 21 (01/18 1033) BP: (106-142)/(60-110) 120/67 (01/18 0947) SpO2:  [90 %-100 %] 95 % (01/18 1033) Weight:  [159 lb 6.3 oz (72.3 kg)] 159 lb 6.3 oz (72.3 kg) (01/18 0400)  Filed Weights   07/19/16 0554 07/19/16 1539 07/21/16 0400  Weight: 160 lb (72.6 kg) 159 lb 13.3 oz (72.5 kg) 159 lb 6.3 oz (72.3 kg)    Weight change: -7.1 oz (-0.2 kg)   Hemodynamic parameters for last 24 hours:    Intake/Output from previous day: 01/17 0701 - 01/18 0700 In: 2370 [P.O.:960; I.V.:1410] Out: 1930 [Urine:1670; Chest Tube:260]  Intake/Output this shift: Total I/O In: -  Out: 1100 [Urine:1100]  Current Meds: Scheduled Meds: . acetaminophen  1,000 mg Oral Q6H   Or  . acetaminophen (TYLENOL) oral liquid 160 mg/5 mL  1,000 mg Oral Q6H  . bisacodyl  10 mg Oral Daily  . [START ON 07/22/2016] enoxaparin (LOVENOX) injection  40 mg Subcutaneous Q24H  . fentaNYL   Intravenous Q4H  . insulin aspart  0-24 Units Subcutaneous Q4H  . lidocaine (PF)      . mometasone-formoterol  2 puff Inhalation BID  . senna-docusate  1 tablet Oral QHS  . tamsulosin  0.4 mg Oral QPC breakfast  . tiotropium  1 capsule  Inhalation Daily   Continuous Infusions: . dextrose 5 % and 0.45% NaCl 100 mL/hr at 07/21/16 0700   PRN Meds:.albuterol, diphenhydrAMINE **OR** diphenhydrAMINE, naloxone **AND** sodium chloride flush, ondansetron (ZOFRAN) IV, oxyCODONE, potassium chloride (KCL MULTIRUN) 30 mEq in 265 mL IVPB, traMADol  General appearance: alert and cooperative Neurologic: intact Heart: regular rate and rhythm, S1, S2 normal, no murmur, click, rub or gallop Lungs: diminished breath sounds bibasilar Abdomen: soft, non-tender; bowel sounds normal; no masses,  no organomegaly Extremities: extremities normal, atraumatic, no cyanosis or edema and Homans sign is negative, no sign of DVT Wound: air leak with cough drainage around anterior chest tube suture placed sterilely    Lab Results: CBC: Recent Labs  07/20/16 0615 07/21/16 0443  WBC 16.9* 15.3*  HGB 13.9 14.0  HCT 42.4 42.9  PLT 171 162   BMET:  Recent Labs  07/20/16 0615 07/21/16 0443  NA 136 135  K 3.9 4.0  CL 104 103  CO2 24 25  GLUCOSE 121* 104*  BUN 6 5*  CREATININE 0.74 0.76  CALCIUM 9.0 8.9    CMET: Lab Results  Component Value Date   WBC 15.3 (H) 07/21/2016   HGB 14.0 07/21/2016   HCT 42.9 07/21/2016  PLT 162 07/21/2016   GLUCOSE 104 (H) 07/21/2016   CHOL 243 (H) 07/11/2016   TRIG 102 07/11/2016   HDL 75 07/11/2016   LDLCALC 148 (H) 07/11/2016   ALT 17 07/21/2016   AST 30 07/21/2016   NA 135 07/21/2016   K 4.0 07/21/2016   CL 103 07/21/2016   CREATININE 0.76 07/21/2016   BUN 5 (L) 07/21/2016   CO2 25 07/21/2016   INR 0.94 07/15/2016      PT/INR: No results for input(s): LABPROT, INR in the last 72 hours. Radiology: Dg Chest Port 1 View  Result Date: 07/21/2016 CLINICAL DATA:  Chest pain.  Chest tubes placed. EXAM: PORTABLE CHEST 1 VIEW COMPARISON:  July 20, 2016 and July 19, 2016 FINDINGS: Chest tubes are unchanged in position on the right. The right jugular catheter has been removed. Moderate  pneumothorax remains on the right without tension component. There is extensive subcutaneous air on the right. There is patchy airspace consolidation in the right base, primarily due to atelectasis, stable. There is a small right pleural effusion. There is atelectatic change in left base with minimal left pleural effusion. Heart size is normal. Pulmonary vascularity on the left is normal. There is mild distortion of pulmonary vascularity on the right due to the pneumothorax. No evident bone lesions. IMPRESSION: Persistent moderate pneumothorax on the right with chest tubes in place, not appreciably changed in position compared to 1 day prior. The overall appearance of the pneumothorax is stable compared to 1 day prior. No evident tension component. Atelectatic change in lung bases with minimal pleural effusions. There is fairly extensive subcutaneous air on the right. Stable cardiac silhouette. Electronically Signed   By: Lowella Grip III M.D.   On: 07/21/2016 07:30     Assessment/Plan: S/P Procedure(s) (LRB): VIDEO BRONCHOSCOPY (N/A) VIDEO ASSISTED THORACOSCOPY (VATS) (Right) WEDGE  RESECTION RIGHT UPPER LOBE (Right) LOBECTOMY RIGHT UPPER LOBE WITH WEDGE RESECTION SUPERIOR SEGMENT RIGHT LOWER LOBE WITH PLACEMENT OF ON-Q (Right) LYMPH NODE DISSECTION (Right) Mobilize Continue foley due to acute urinary retention 1100 residual in bladder this am, started on Flomax and foley  replaced  For acute urinary  retention  Ct to low suction   [name] 07/21/2016 10:51 AM

## 2016-07-22 ENCOUNTER — Inpatient Hospital Stay (HOSPITAL_COMMUNITY): Payer: Managed Care, Other (non HMO)

## 2016-07-22 LAB — BASIC METABOLIC PANEL
Anion gap: 10 (ref 5–15)
BUN: 6 mg/dL (ref 6–20)
CO2: 24 mmol/L (ref 22–32)
Calcium: 8.7 mg/dL — ABNORMAL LOW (ref 8.9–10.3)
Chloride: 98 mmol/L — ABNORMAL LOW (ref 101–111)
Creatinine, Ser: 0.66 mg/dL (ref 0.61–1.24)
GFR calc Af Amer: >60 mL/min (ref >60)
GFR calc non Af Amer: >60 mL/min (ref >60)
Glucose, Bld: 110 mg/dL — ABNORMAL HIGH (ref 65–99)
Potassium: 3.5 mmol/L (ref 3.5–5.1)
Sodium: 132 mmol/L — ABNORMAL LOW (ref 135–145)

## 2016-07-22 LAB — CBC
HCT: 36.8 % — ABNORMAL LOW (ref 39.0–52.0)
Hemoglobin: 12.3 g/dL — ABNORMAL LOW (ref 13.0–17.0)
MCH: 30.7 pg (ref 26.0–34.0)
MCHC: 33.4 g/dL (ref 30.0–36.0)
MCV: 91.8 fL (ref 78.0–100.0)
Platelets: 162 10*3/uL (ref 150–400)
RBC: 4.01 MIL/uL — ABNORMAL LOW (ref 4.22–5.81)
RDW: 15.1 % (ref 11.5–15.5)
WBC: 11.7 10*3/uL — ABNORMAL HIGH (ref 4.0–10.5)

## 2016-07-22 LAB — GLUCOSE, CAPILLARY
Glucose-Capillary: 114 mg/dL — ABNORMAL HIGH (ref 65–99)
Glucose-Capillary: 123 mg/dL — ABNORMAL HIGH (ref 65–99)
Glucose-Capillary: 112 mg/dL — ABNORMAL HIGH (ref 65–99)
Glucose-Capillary: 161 mg/dL — ABNORMAL HIGH (ref 65–99)
Glucose-Capillary: 136 mg/dL — ABNORMAL HIGH (ref 65–99)

## 2016-07-22 LAB — URINE CULTURE
Culture: NO GROWTH
Special Requests: NORMAL

## 2016-07-22 MED ORDER — SODIUM CHLORIDE 0.9 % IV SOLN
30.0000 meq | Freq: Once | INTRAVENOUS | Status: AC
  Administered 2016-07-22: 30 meq via INTRAVENOUS
  Filled 2016-07-22: qty 15

## 2016-07-22 NOTE — Progress Notes (Addendum)
Patient ID: [name], male   DOB: 10-26-1950, 66 y.o.   MRN: [id number] TCTS DAILY ICU PROGRESS NOTE                   [address]            [place] [address]          [phone]   3 Days Post-Op Procedure(s) (LRB): VIDEO BRONCHOSCOPY (N/A) VIDEO ASSISTED THORACOSCOPY (VATS) (Right) WEDGE  RESECTION RIGHT UPPER LOBE (Right) LOBECTOMY RIGHT UPPER LOBE WITH WEDGE RESECTION SUPERIOR SEGMENT RIGHT LOWER LOBE WITH PLACEMENT OF ON-Q (Right) LYMPH NODE DISSECTION (Right)  Total Length of Stay:  LOS: 3 days   Subjective: Up in chair, alert  Good pain control  Objective: Vital signs in last 24 hours: Temp:  [97.6 F (36.4 C)-99.1 F (37.3 C)] 98.1 F (36.7 C) (01/19 0400) Pulse Rate:  [65-97] 66 (01/19 0700) Cardiac Rhythm: Normal sinus rhythm (01/18 2000) Resp:  [10-29] 10 (01/19 0700) BP: (95-128)/(54-74) 102/67 (01/19 0700) SpO2:  [93 %-100 %] 96 % (01/19 0700) Weight:  [157 lb 6.5 oz (71.4 kg)] 157 lb 6.5 oz (71.4 kg) (01/19 0400)  Filed Weights   07/19/16 1539 07/21/16 0400 07/22/16 0400  Weight: 159 lb 13.3 oz (72.5 kg) 159 lb 6.3 oz (72.3 kg) 157 lb 6.5 oz (71.4 kg)    Weight change: -1 lb 15.7 oz (-0.9 kg)   Hemodynamic parameters for last 24 hours:    Intake/Output from previous day: 01/18 0701 - 01/19 0700 In: 1725 [P.O.:960; I.V.:500; IV Piggyback:265] Out: 2925 [Urine:2725; Chest Tube:200]  Intake/Output this shift: No intake/output data recorded.  Current Meds: Scheduled Meds: . acetaminophen  1,000 mg Oral Q6H   Or  . acetaminophen (TYLENOL) oral liquid 160 mg/5 mL  1,000 mg Oral Q6H  . bisacodyl  10 mg Oral Daily  . enoxaparin (LOVENOX) injection  40 mg Subcutaneous Q24H  . fentaNYL   Intravenous Q4H  . insulin aspart  0-24 Units Subcutaneous Q4H  . mometasone-formoterol  2 puff Inhalation BID  . potassium chloride (KCL MULTIRUN) 30 mEq in 265 mL IVPB  30 mEq Intravenous Once  . senna-docusate  1 tablet Oral QHS  .  tamsulosin  0.4 mg Oral QPC breakfast  . tiotropium  1 capsule Inhalation Daily   Continuous Infusions: . dextrose 5 % and 0.45% NaCl 10 mL/hr at 07/21/16 1133   PRN Meds:.albuterol, diphenhydrAMINE **OR** diphenhydrAMINE, naloxone **AND** sodium chloride flush, ondansetron (ZOFRAN) IV, oxyCODONE, potassium chloride (KCL MULTIRUN) 30 mEq in 265 mL IVPB, traMADol  General appearance: alert, cooperative and no distress Neurologic: intact Heart: regular rate and rhythm, S1, S2 normal, no murmur, click, rub or gallop Lungs: diminished breath sounds bibasilar Abdomen: soft, non-tender; bowel sounds normal; no masses,  no organomegaly Extremities: extremities normal, atraumatic, no cyanosis or edema and Homans sign is negative, no sign of DVT Wound: air leak persistes on 10 suction, mosltly from anterior chest tube   Lab Results: CBC: Recent Labs  07/21/16 0443 07/22/16 0326  WBC 15.3* 11.7*  HGB 14.0 12.3*  HCT 42.9 36.8*  PLT 162 162   BMET:  Recent Labs  07/21/16 0443 07/22/16 0326  NA 135 132*  K 4.0 3.5  CL 103 98*  CO2 25 24  GLUCOSE 104* 110*  BUN 5* 6  CREATININE 0.76 0.66  CALCIUM 8.9 8.7*    CMET: Lab Results  Component Value Date   WBC 11.7 (H) 07/22/2016   HGB  12.3 (L) August 16, 2016   HCT 36.8 (L) 2016-08-16   PLT 162 2016/08/16   GLUCOSE 110 (H) 08/16/16   CHOL 243 (H) 07/11/2016   TRIG 102 07/11/2016   HDL 75 07/11/2016   LDLCALC 148 (H) 07/11/2016   ALT 17 07/21/2016   AST 30 07/21/2016   NA 132 (L) August 16, 2016   K 3.5 08-16-16   CL 98 (L) 08-16-16   CREATININE 0.66 08/16/2016   BUN 6 2016/08/16   CO2 24 August 16, 2016   INR 0.94 07/15/2016      PT/INR: No results for input(s): LABPROT, INR in the last 72 hours. Radiology: Dg Chest Port 1 View  Result Date: 16-Aug-2016 CLINICAL DATA:  Recent surgery for lung mass ; pneumothorax EXAM: PORTABLE CHEST 1 VIEW COMPARISON:  July 21, 2016 FINDINGS: Chest tubes are again noted on the right. A  small right apical pneumothorax, no more than 5%, present. No tension component. Subcutaneous air remains on the right. There is patchy atelectasis in the right mid and lower lung zones with postoperative change. There is mild left base atelectasis. No new opacity. Cardiac silhouette within normal limits. Pulmonary vascular within normal limits. No adenopathy appreciable by radiography. IMPRESSION: Only small pneumothorax currently with chest tubes present on the right. Subcutaneous air remains on the right. Mild left base atelectasis, stable. Stable cardiac silhouette. Electronically Signed   By: Lowella Grip III M.D.   On: Aug 16, 2016 07:27   ptx deceased   Assessment/Plan: S/P Procedure(s) (LRB): VIDEO BRONCHOSCOPY (N/A) VIDEO ASSISTED THORACOSCOPY (VATS) (Right) WEDGE  RESECTION RIGHT UPPER LOBE (Right) LOBECTOMY RIGHT UPPER LOBE WITH WEDGE RESECTION SUPERIOR SEGMENT RIGHT LOWER LOBE WITH PLACEMENT OF ON-Q (Right) LYMPH NODE DISSECTION (Right) Mobilize Leave foley for acute urinary retention, stared on flomax yesterday, try d/c foley in am and follow up with bladder scan    [name] 08-16-2016 8:02 AM    Final path discusssed with the patient  And wife   Cancer Staging Lung cancer [facility]) Staging form: Lung, AJCC 8th Edition - Pathologic stage from 2016/08/16: Stage IA3 (pT1c, pN0, cM0) - Signed by [name], MD on 2016/08/16

## 2016-07-22 NOTE — OR Nursing (Signed)
Pt. Chart viewed and information obtained per Dr. Servando Snare request.

## 2016-07-22 NOTE — Progress Notes (Signed)
TCTS BRIEF SICU PROGRESS NOTE  3 Days Post-Op  S/P Procedure(s) (LRB): VIDEO BRONCHOSCOPY (N/A) VIDEO ASSISTED THORACOSCOPY (VATS) (Right) WEDGE  RESECTION RIGHT UPPER LOBE (Right) LOBECTOMY RIGHT UPPER LOBE WITH WEDGE RESECTION SUPERIOR SEGMENT RIGHT LOWER LOBE WITH PLACEMENT OF ON-Q (Right) LYMPH NODE DISSECTION (Right)   Stable day  Plan: Continue current plan  Rexene Alberts, MD 07/22/2016 6:23 PM

## 2016-07-23 ENCOUNTER — Inpatient Hospital Stay (HOSPITAL_COMMUNITY): Payer: Managed Care, Other (non HMO)

## 2016-07-23 LAB — BASIC METABOLIC PANEL
Anion gap: 9 (ref 5–15)
BUN: 6 mg/dL (ref 6–20)
CO2: 26 mmol/L (ref 22–32)
Calcium: 8.8 mg/dL — ABNORMAL LOW (ref 8.9–10.3)
Chloride: 98 mmol/L — ABNORMAL LOW (ref 101–111)
Creatinine, Ser: 0.61 mg/dL (ref 0.61–1.24)
GFR calc Af Amer: >60 mL/min (ref >60)
GFR calc non Af Amer: >60 mL/min (ref >60)
Glucose, Bld: 122 mg/dL — ABNORMAL HIGH (ref 65–99)
Potassium: 3.4 mmol/L — ABNORMAL LOW (ref 3.5–5.1)
Sodium: 133 mmol/L — ABNORMAL LOW (ref 135–145)

## 2016-07-23 LAB — CBC
HCT: 36.2 % — ABNORMAL LOW (ref 39.0–52.0)
Hemoglobin: 12.1 g/dL — ABNORMAL LOW (ref 13.0–17.0)
MCH: 30.5 pg (ref 26.0–34.0)
MCHC: 33.4 g/dL (ref 30.0–36.0)
MCV: 91.2 fL (ref 78.0–100.0)
Platelets: 179 10*3/uL (ref 150–400)
RBC: 3.97 MIL/uL — ABNORMAL LOW (ref 4.22–5.81)
RDW: 14.7 % (ref 11.5–15.5)
WBC: 8.7 10*3/uL (ref 4.0–10.5)

## 2016-07-23 LAB — GLUCOSE, CAPILLARY
Glucose-Capillary: 106 mg/dL — ABNORMAL HIGH (ref 65–99)
Glucose-Capillary: 109 mg/dL — ABNORMAL HIGH (ref 65–99)
Glucose-Capillary: 178 mg/dL — ABNORMAL HIGH (ref 65–99)
Glucose-Capillary: 81 mg/dL (ref 65–99)

## 2016-07-23 MED ORDER — SODIUM CHLORIDE 0.9% FLUSH
10.0000 mL | INTRAVENOUS | Status: DC | PRN
  Administered 2016-07-25 – 2016-08-01 (×9): 10 mL
  Filled 2016-07-23 (×9): qty 40

## 2016-07-23 MED ORDER — SALINE SPRAY 0.65 % NA SOLN
1.0000 | NASAL | Status: DC | PRN
  Filled 2016-07-23: qty 44

## 2016-07-23 NOTE — Progress Notes (Signed)
[address]       [place] [address]             [phone]        CARDIOTHORACIC SURGERY PROGRESS NOTE   R4 Days Post-Op Procedure(s) (LRB): VIDEO BRONCHOSCOPY (N/A) VIDEO ASSISTED THORACOSCOPY (VATS) (Right) WEDGE  RESECTION RIGHT UPPER LOBE (Right) LOBECTOMY RIGHT UPPER LOBE WITH WEDGE RESECTION SUPERIOR SEGMENT RIGHT LOWER LOBE WITH PLACEMENT OF ON-Q (Right) LYMPH NODE DISSECTION (Right)  Subjective: Complains of soreness in chest and nasal congestion  Objective: Vital signs: BP Readings from Last 1 Encounters:  07/23/16 121/71   Pulse Readings from Last 1 Encounters:  07/23/16 87   Resp Readings from Last 1 Encounters:  07/23/16 20   Temp Readings from Last 1 Encounters:  07/23/16 97.4 F (36.3 C) (Oral)    Hemodynamics:    Physical Exam:  Rhythm:   sinus  Breath sounds: Fairly clear  Heart sounds:  RRR  Incisions:  Dressings dry, intact  Abdomen:  Soft, non-distended, non-tender  Extremities:  Warm, well-perfused  Chest tubes:  Low volume thin serosanguinous output, + large air leak but not continuous    Intake/Output from previous day: 01/19 0701 - 01/20 0700 In: 2150 [P.O.:1920; I.V.:230] Out: 2745 [Urine:2535; Chest Tube:210] Intake/Output this shift: Total I/O In: 10 [I.V.:10] Out: -   Lab Results:  CBC: Recent Labs  07/22/16 0326 07/23/16 0225  WBC 11.7* 8.7  HGB 12.3* 12.1*  HCT 36.8* 36.2*  PLT 162 179    BMET:  Recent Labs  07/22/16 0326 07/23/16 0225  NA 132* 133*  K 3.5 3.4*  CL 98* 98*  CO2 24 26  GLUCOSE 110* 122*  BUN 6 6  CREATININE 0.66 0.61  CALCIUM 8.7* 8.8*     PT/INR:  No results for input(s): LABPROT, INR in the last 72 hours.  CBG (last 3)   Recent Labs  07/22/16 1148 07/22/16 1605 07/22/16 1950  GLUCAP 112* 161* 136*    ABG    Component Value Date/Time   PHART 7.392 07/20/2016 0515   PCO2ART 38.4 07/20/2016 0515   PO2ART 74.6 (L) 07/20/2016 0515   HCO3 22.8  07/20/2016 0515   ACIDBASEDEF 1.4 07/20/2016 0515   O2SAT 94.7 07/20/2016 0515    CXR: PORTABLE CHEST 1 VIEW  COMPARISON:  Chest radiograph from one day prior.  FINDINGS: Stable positions of right apical and right mid chest tubes. Surgical sutures overlie the right hilar and medial right upper lung region. Stable cardiomediastinal silhouette with normal heart size. Stable small right apical pneumothorax. No left pneumothorax. Stable volume loss in the right hemithorax. Stable mild patchy opacity at the right lung base. Stable mild left basilar atelectasis. No pulmonary edema. Stable subcutaneous emphysema in the visualized right lateral chest wall.  IMPRESSION: 1. Stable small right apical pneumothorax. Stable right chest tube positions. 2. Stable subcutaneous emphysema in the right lateral chest wall. 3. Stable volume loss in the right hemithorax. 4. Stable bibasilar lung opacities, favor atelectasis.   Electronically Signed   By: Ilona Sorrel M.D.   On: 07/23/2016 08:20  Assessment/Plan: S/P Procedure(s) (LRB): VIDEO BRONCHOSCOPY (N/A) VIDEO ASSISTED THORACOSCOPY (VATS) (Right) WEDGE  RESECTION RIGHT UPPER LOBE (Right) LOBECTOMY RIGHT UPPER LOBE WITH WEDGE RESECTION SUPERIOR SEGMENT RIGHT LOWER LOBE WITH PLACEMENT OF ON-Q (Right) LYMPH NODE DISSECTION (Right)  Overall stable but large air leak persists Will try placing tubes to water seal Trial Foley catheter removal Mobilize  Rexene Alberts, MD  07/23/2016 11:24 AM

## 2016-07-24 ENCOUNTER — Inpatient Hospital Stay (HOSPITAL_COMMUNITY): Payer: Managed Care, Other (non HMO)

## 2016-07-24 LAB — GLUCOSE, CAPILLARY
Glucose-Capillary: 126 mg/dL — ABNORMAL HIGH (ref 65–99)
Glucose-Capillary: 127 mg/dL — ABNORMAL HIGH (ref 65–99)
Glucose-Capillary: 93 mg/dL (ref 65–99)
Glucose-Capillary: 94 mg/dL (ref 65–99)
Glucose-Capillary: 98 mg/dL (ref 65–99)
Glucose-Capillary: 99 mg/dL (ref 65–99)

## 2016-07-24 NOTE — Progress Notes (Addendum)
[address]       [place] [address]             [phone]        CARDIOTHORACIC SURGERY PROGRESS NOTE   R5 Days Post-Op Procedure(s) (LRB): VIDEO BRONCHOSCOPY (N/A) VIDEO ASSISTED THORACOSCOPY (VATS) (Right) WEDGE  RESECTION RIGHT UPPER LOBE (Right) LOBECTOMY RIGHT UPPER LOBE WITH WEDGE RESECTION SUPERIOR SEGMENT RIGHT LOWER LOBE WITH PLACEMENT OF ON-Q (Right) LYMPH NODE DISSECTION (Right)  Subjective: Feels a little better.  Eating well.  Ambulating some.  Developed increased chest pain w/ attempts to place tubes to water seal yesterday - pain improved when tubes replaced to suction  Objective: Vital signs: BP Readings from Last 1 Encounters:  07/24/16 117/70   Pulse Readings from Last 1 Encounters:  07/24/16 67   Resp Readings from Last 1 Encounters:  07/24/16 (!) 21   Temp Readings from Last 1 Encounters:  07/24/16 97.3 F (36.3 C) (Oral)    Hemodynamics:    Physical Exam:  Rhythm:   sinus  Breath sounds: clear  Heart sounds:  RRR  Incisions:  Dressings dry, intact  Abdomen:  Soft, non-distended, non-tender  Extremities:  Warm, well-perfused  Chest tubes:  trivial volume thin serosanguinous output, + large air leak    Intake/Output from previous day: 01/20 0701 - 01/21 0700 In: 240 [I.V.:240] Out: 2250 [Urine:2020; Chest Tube:230] Intake/Output this shift: Total I/O In: -  Out: 100 [Urine:100]  Lab Results:  CBC: Recent Labs  07/22/16 0326 07/23/16 0225  WBC 11.7* 8.7  HGB 12.3* 12.1*  HCT 36.8* 36.2*  PLT 162 179    BMET:  Recent Labs  07/22/16 0326 07/23/16 0225  NA 132* 133*  K 3.5 3.4*  CL 98* 98*  CO2 24 26  GLUCOSE 110* 122*  BUN 6 6  CREATININE 0.66 0.61  CALCIUM 8.7* 8.8*     PT/INR:  No results for input(s): LABPROT, INR in the last 72 hours.  CBG (last 3)   Recent Labs  07/23/16 2318 07/24/16 0430 07/24/16 0745  GLUCAP 81 99 94    ABG    Component Value Date/Time   PHART 7.392  07/20/2016 0515   PCO2ART 38.4 07/20/2016 0515   PO2ART 74.6 (L) 07/20/2016 0515   HCO3 22.8 07/20/2016 0515   ACIDBASEDEF 1.4 07/20/2016 0515   O2SAT 94.7 07/20/2016 0515    CXR: PORTABLE CHEST 1 VIEW  COMPARISON:  July 23, 2016  FINDINGS: Two right-sided chest tubes remain in place. A right sided pneumothorax measures 2.9 cm today, unchanged in the interval. Subcutaneous air in the right chest wall is stable. Opacity in the right base is stable. Mild atelectasis in the left lung base. No left-sided pneumothorax or other opacity. The cardiomediastinal silhouette is stable.  IMPRESSION: 1. Stable right chest tubes with a persistent right pneumothorax. 2. Postsurgical changes and opacity in the right base are stable. 3. No other change.   Electronically Signed   By: Dorise Bullion III M.D   On: 07/24/2016 08:03  Assessment/Plan: S/P Procedure(s) (LRB): VIDEO BRONCHOSCOPY (N/A) VIDEO ASSISTED THORACOSCOPY (VATS) (Right) WEDGE  RESECTION RIGHT UPPER LOBE (Right) LOBECTOMY RIGHT UPPER LOBE WITH WEDGE RESECTION SUPERIOR SEGMENT RIGHT LOWER LOBE WITH PLACEMENT OF ON-Q (Right) LYMPH NODE DISSECTION (Right)  Overall stable but large air leak persists Increased pain reported when tubes placed to water seal yesterday CXR stable w/ good reexpansion of right lung w/ tubes on 10 cm H2O suction Voiding urine w/out  difficulty since Foley catheter removed   Continue suction for now  Mobilize as much as possible  Rexene Alberts, MD 07/24/2016 8:51 AM

## 2016-07-25 ENCOUNTER — Inpatient Hospital Stay (HOSPITAL_COMMUNITY): Payer: Managed Care, Other (non HMO)

## 2016-07-25 LAB — GLUCOSE, CAPILLARY
Glucose-Capillary: 110 mg/dL — ABNORMAL HIGH (ref 65–99)
Glucose-Capillary: 113 mg/dL — ABNORMAL HIGH (ref 65–99)
Glucose-Capillary: 104 mg/dL — ABNORMAL HIGH (ref 65–99)
Glucose-Capillary: 116 mg/dL — ABNORMAL HIGH (ref 65–99)
Glucose-Capillary: 130 mg/dL — ABNORMAL HIGH (ref 65–99)
Glucose-Capillary: 145 mg/dL — ABNORMAL HIGH (ref 65–99)
Glucose-Capillary: 106 mg/dL — ABNORMAL HIGH (ref 65–99)

## 2016-07-25 NOTE — Progress Notes (Signed)
CT surgery p.m. Rounds  Patient had stable day ambulating taking adequate po nutrition Feels stronger Airleak remains unchanged

## 2016-07-25 NOTE — Plan of Care (Signed)
Wasted 7 cc fentanyl in sink witnessed by [employer]

## 2016-07-25 NOTE — Progress Notes (Signed)
Patient ID: JACEON HEIBERGER, male   DOB: Dec 31, 1950, 66 y.o.   MRN: [id number] TCTS DAILY ICU PROGRESS NOTE                   [address]            [place], [address]          [phone]   6 Days Post-Op Procedure(s) (LRB): VIDEO BRONCHOSCOPY (N/A) VIDEO ASSISTED THORACOSCOPY (VATS) (Right) WEDGE  RESECTION RIGHT UPPER LOBE (Right) LOBECTOMY RIGHT UPPER LOBE WITH WEDGE RESECTION SUPERIOR SEGMENT RIGHT LOWER LOBE WITH PLACEMENT OF ON-Q (Right) LYMPH NODE DISSECTION (Right)  Total Length of Stay:  LOS: 6 days   Subjective: Alert and neuro intact, SOB when ambulating ,  Objective: Vital signs in last 24 hours: Temp:  [97.4 F (36.3 C)-98.7 F (37.1 C)] 98.5 F (36.9 C) (01/22 0800) Pulse Rate:  [68-97] 74 (01/22 1000) Cardiac Rhythm: Normal sinus rhythm (01/22 0813) Resp:  [9-27] 19 (01/22 1000) BP: (110-127)/(65-92) 122/70 (01/22 1000) SpO2:  [83 %-97 %] 97 % (01/22 1015) FiO2 (%):  [0 %] 0 % (01/21 1600)  Filed Weights   07/19/16 1539 07/21/16 0400 07/22/16 0400  Weight: 159 lb 13.3 oz (72.5 kg) 159 lb 6.3 oz (72.3 kg) 157 lb 6.5 oz (71.4 kg)    Weight change:    Hemodynamic parameters for last 24 hours:    Intake/Output from previous day: 01/21 0701 - 01/22 0700 In: 5277 [P.O.:950; I.V.:240] Out: 1685 [Urine:1425; Chest Tube:260]  Intake/Output this shift: Total I/O In: 270 [P.O.:240; I.V.:30] Out: 200 [Urine:200]  Current Meds: Scheduled Meds: . bisacodyl  10 mg Oral Daily  . enoxaparin (LOVENOX) injection  40 mg Subcutaneous Q24H  . fentaNYL   Intravenous Q4H  . insulin aspart  0-24 Units Subcutaneous Q4H  . mometasone-formoterol  2 puff Inhalation BID  . senna-docusate  1 tablet Oral QHS  . tamsulosin  0.4 mg Oral QPC breakfast  . tiotropium  1 capsule Inhalation Daily   Continuous Infusions: . dextrose 5 % and 0.45% NaCl 10 mL/hr at 07/25/16 1000   PRN Meds:.albuterol, diphenhydrAMINE **OR** diphenhydrAMINE, naloxone **AND**  sodium chloride flush, ondansetron (ZOFRAN) IV, oxyCODONE, potassium chloride (KCL MULTIRUN) 30 mEq in 265 mL IVPB, sodium chloride, sodium chloride flush, traMADol  General appearance: alert, cooperative and no distress Neurologic: intact Heart: regular rate and rhythm, S1, S2 normal, no murmur, click, rub or gallop Lungs: diminished breath sounds bibasilar Abdomen: soft, non-tender; bowel sounds normal; no masses,  no organomegaly Extremities: extremities normal, atraumatic, no cyanosis or edema and Homans sign is negative, no sign of DVT Wound: air leak 2-3 + with coughing, mostly from anteriro chest tube   Lab Results: CBC:  Recent Labs  07/23/16 0225  WBC 8.7  HGB 12.1*  HCT 36.2*  PLT 179   BMET:   Recent Labs  07/23/16 0225  NA 133*  K 3.4*  CL 98*  CO2 26  GLUCOSE 122*  BUN 6  CREATININE 0.61  CALCIUM 8.8*    CMET: Lab Results  Component Value Date   WBC 8.7 07/23/2016   HGB 12.1 (L) 07/23/2016   HCT 36.2 (L) 07/23/2016   PLT 179 07/23/2016   GLUCOSE 122 (H) 07/23/2016   CHOL 243 (H) 07/11/2016   TRIG 102 07/11/2016   HDL 75 07/11/2016   LDLCALC 148 (H) 07/11/2016   ALT 17 07/21/2016   AST 30 07/21/2016   NA 133 (L) 07/23/2016   K  3.4 (L) 07/23/2016   CL 98 (L) 07/23/2016   CREATININE 0.61 07/23/2016   BUN 6 07/23/2016   CO2 26 07/23/2016   INR 0.94 07/15/2016      PT/INR: No results for input(s): LABPROT, INR in the last 72 hours. Radiology: Dg Chest Port 1 View  Result Date: 07/25/2016 CLINICAL DATA:  Recent VATS procedure.  Shortness of breath. EXAM: PORTABLE CHEST 1 VIEW COMPARISON:  July 24, 2016 FINDINGS: Chest tubes remain on the right. Right apical pneumothorax is smaller compared to 1 day prior. No tension component. There is extensive soft tissue air on the right. There is patchy bibasilar atelectasis, more on the right than on the left. There is a small right pleural effusion. Heart size and pulmonary vascularity are normal. No  adenopathy. There is atherosclerotic calcification in the aorta. IMPRESSION: Smaller pneumothorax on the right with right chest tubes remaining. There is extensive soft tissue air on the right. There is bibasilar atelectasis, slightly more on the right than on the left with small right pleural effusion. Stable cardiac silhouette. There is aortic atherosclerosis. Electronically Signed   By: Lowella Grip III M.D.   On: 07/25/2016 07:39     Assessment/Plan: S/P Procedure(s) (LRB): VIDEO BRONCHOSCOPY (N/A) VIDEO ASSISTED THORACOSCOPY (VATS) (Right) WEDGE  RESECTION RIGHT UPPER LOBE (Right) LOBECTOMY RIGHT UPPER LOBE WITH WEDGE RESECTION SUPERIOR SEGMENT RIGHT LOWER LOBE WITH PLACEMENT OF ON-Q (Right) LYMPH NODE DISSECTION (Right) Mobilize Voiding well this am on flomax and foley out  Leave chest tube in place for now, convert to po pain meds get off pca    [name] 07/25/2016 10:44 AM

## 2016-07-25 NOTE — Plan of Care (Signed)
Returned full unused fentanyl syringe to [employer] in main pharmacy

## 2016-07-25 NOTE — Progress Notes (Signed)
Attempted to ambulate patient in the hall but he was in too great of pain and began desatting with exertion. Opted to keep patient in bed and try again closer to morning time. Will continue to monitor.  [name]

## 2016-07-25 NOTE — Op Note (Signed)
NAMEKARMELO, BASS NO.:  [id number]  MEDICAL RECORD NO.:  [id number]  LOCATION:                                 FACILITY:  PHYSICIAN:  [name], MD    DATE OF BIRTH:  [birth year]  DATE OF PROCEDURE:  07/19/2016 DATE OF DISCHARGE:                              OPERATIVE REPORT   PREOPERATIVE DIAGNOSIS:  Right upper lobe lung mass.  POSTOPERATIVE DIAGNOSIS:  Right upper lobe lung mass.  SURGICAL PROCEDURE:  Video bronchoscopy, right video-assisted thoracoscopy, mini thoracotomy, wedge resection of right upper lobe and superior segment of right lower lobe with completion right upper lobectomy, lymph node dissection, and placement of On-Q device.  SURGEON:  [name], MD.  FIRST ASSISTANT:  Nicholes Rough, [place].  BRIEF HISTORY:  The patient is a 66 year old male, who has a long history of smoking, presented several months prior to surgery with increased cough.  Chest x-ray showed right upper lobe lung abnormality. 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  The patient had borderline PFTs.  CPX testing was done that showed maximum oxygen consumption of 19.  Preoperative cardiac clearance was obtained, and we recommended to the patient proceeding with bronchoscopy, video-assisted thoracoscopy, and lung resection.  The patient agreed and signed informed consent.  DESCRIPTION OF PROCEDURE:  With a central line and arterial line in place, the patient underwent general endotracheal anesthesia with a single-lumen endotracheal tube.  After appropriate time-out was performed, a fiberoptic bronchoscope was passed to the subsegmental level, both in the right and left tracheobronchial tree.  There were no endobronchial lesions appreciated.  The scope was removed.  The patient was turned in lateral decubitus position with right side up, which had previously been marked.  The right chest was  prepped with Betadine and draped in the usual sterile manner.  A small utility incision and 2 other port sites were created in the right chest.  The right lung was deflated, but with some difficulty.  The mass that was present on CT scan, was located with palpation in the posterior portion of the right upper lobe.  There were adhesions along the fissure with the superior segment of the lower lobe.  A wedge of superior segment of the lower lobe with a wedge resection of the right upper lobe was performed to obtain a tissue diagnosis.  A frozen section was done on the cut edge of the lower lobe margin and was free of tumor.  The primary mass confirmed non-small cell cancer.  We then proceeded with formal right upper lobectomy.  Neither of the fissures were complete.  The lung was retracted posteriorly and dissection carried along the superior pulmonary vein, which was encircled and stapled and divided with a vascular stapler.  Dissection was then carried along the pulmonary artery identifying the common trunk, pulmonary artery branches to the anterior and apical segments.  This branch was divided with a vascular load stapler.  Dissection was carried along the pulmonary artery identifying the branches to the middle lobe in the posterior branch to the upper lobe was divided.  This gave  exposure anteriorly of the right upper lobe bronchus.  We then used a green load stapler and clamped the right upper lobe bronchus with test inflation along the middle and the lower lobes inflated appropriately.  The bronchus was divided.  We then proceeded dividing the lung with series of staples along the fissure with the middle lobe and lower lobe.  As noted, the fissures were very incomplete.  CoSeal was placed along the staple line to cut down on air leak.  The bronchial stump was without any air leak.  We then proceeded with lymph node dissection from 2R, 4R, 10R, 11R, and 12R areas using ON- Q tunneling  device.  A catheter was tunneled subpleurally along the posterior ribs connected to infusion of proparacaine.  The two sites had been used as port sites, were used for placement of chest tubes, a Blake drain posteriorly and a standard 28 chest tube anteriorly.  The lung was reinflated.  The small incision was then closed with interrupted 0 Vicryl in the deep layers, running 2-0 Vicryl in subcutaneous tissue, and a 3-0 subcuticular stitch in skin edges.  Dry dressings were applied.  The patient was awakened and extubated in the operating room. Sponge and needle counts were reported as correct at the completion of procedure.  The patient tolerated the procedure without obvious complication.  Total blood loss was approximately 150 mL.     [name], MD   ______________________________ [name], MD    EG/MEDQ  D:  07/21/2016  T:  07/22/2016  Job:  [id number]

## 2016-07-26 ENCOUNTER — Inpatient Hospital Stay (HOSPITAL_COMMUNITY): Payer: Managed Care, Other (non HMO)

## 2016-07-26 LAB — GLUCOSE, CAPILLARY
Glucose-Capillary: 114 mg/dL — ABNORMAL HIGH (ref 65–99)
Glucose-Capillary: 132 mg/dL — ABNORMAL HIGH (ref 65–99)
Glucose-Capillary: 138 mg/dL — ABNORMAL HIGH (ref 65–99)
Glucose-Capillary: 170 mg/dL — ABNORMAL HIGH (ref 65–99)
Glucose-Capillary: 74 mg/dL (ref 65–99)

## 2016-07-26 NOTE — Care Management Note (Signed)
Case Management Note  Patient Details  Name: [name] MRN: [id number] Date of Birth: 1950/12/23  Subjective/Objective: S/p VATS, wedge resection and lobectomy for suspected Lung CA               Action/Plan:   PTA independent from home with wife.  CM will continue to follow for discharge needs   Expected Discharge Date:  07/25/16               Expected Discharge Plan:     In-House Referral:     Discharge planning Services  CM Consult  Post Acute Care Choice:    Choice offered to:     DME Arranged:    DME Agency:     HH Arranged:    HH Agency:     Status of Service:  In process, will continue to follow  If discussed at Long Length of Stay Meetings, dates discussed:    Additional Comments: 07/26/2016 Discussed in LOS 07/26/16 - remains appropriate for continued stay Maryclare Labrador, RN 07/26/2016, 10:55 AM

## 2016-07-26 NOTE — Progress Notes (Signed)
Patient ID: [name], male   DOB: 31-May-1951, 66 y.o.   MRN: [id number] TCTS DAILY ICU PROGRESS NOTE                   [address]            [place] [address]          [phone]   7 Days Post-Op Procedure(s) (LRB): VIDEO BRONCHOSCOPY (N/A) VIDEO ASSISTED THORACOSCOPY (VATS) (Right) WEDGE  RESECTION RIGHT UPPER LOBE (Right) LOBECTOMY RIGHT UPPER LOBE WITH WEDGE RESECTION SUPERIOR SEGMENT RIGHT LOWER LOBE WITH PLACEMENT OF ON-Q (Right) LYMPH NODE DISSECTION (Right)   Cancer Staging Lung cancer (HCC) Staging form: Lung, AJCC 8th Edition - Pathologic stage from 07/22/2016: Stage IA3 (pT1c, pN0, cM0) - Signed by [name], MD on 07/22/2016  Total Length of Stay:  LOS: 7 days   Subjective: Feels ok today   Objective: Vital signs in last 24 hours: Temp:  [97.6 F (36.4 C)-98.6 F (37 C)] 97.6 F (36.4 C) (01/23 0901) Pulse Rate:  [64-96] 70 (01/23 0900) Cardiac Rhythm: Normal sinus rhythm (01/23 0737) Resp:  [8-29] 12 (01/23 0900) BP: (110-145)/(65-114) 118/74 (01/23 0900) SpO2:  [92 %-99 %] 94 % (01/23 0900) Weight:  [152 lb 8.9 oz (69.2 kg)] 152 lb 8.9 oz (69.2 kg) (01/23 0600)  Filed Weights   07/21/16 0400 07/22/16 0400 07/26/16 0600  Weight: 159 lb 6.3 oz (72.3 kg) 157 lb 6.5 oz (71.4 kg) 152 lb 8.9 oz (69.2 kg)    Weight change:    Hemodynamic parameters for last 24 hours:    Intake/Output from previous day: 01/22 0701 - 01/23 0700 In: 1600 [P.O.:1560; I.V.:40] Out: 1410 [Urine:1080; Chest Tube:330]  Intake/Output this shift: Total I/O In: 480 [P.O.:480] Out: 225 [Urine:150; Chest Tube:75]  Current Meds: Scheduled Meds: . bisacodyl  10 mg Oral Daily  . enoxaparin (LOVENOX) injection  40 mg Subcutaneous Q24H  . insulin aspart  0-24 Units Subcutaneous Q4H  . mometasone-formoterol  2 puff Inhalation BID  . senna-docusate  1 tablet Oral QHS  . tamsulosin  0.4 mg Oral QPC breakfast  . tiotropium  1 capsule Inhalation Daily    Continuous Infusions: . dextrose 5 % and 0.45% NaCl Stopped (07/25/16 1100)   PRN Meds:.albuterol, oxyCODONE, potassium chloride (KCL MULTIRUN) 30 mEq in 265 mL IVPB, sodium chloride, sodium chloride flush, traMADol  General appearance: alert and cooperative Neurologic: intact Heart: regular rate and rhythm, S1, S2 normal, no murmur, click, rub or gallop Lungs: diminished breath sounds bibasilar Abdomen: soft, non-tender; bowel sounds normal; no masses,  no organomegaly Extremities: extremities normal, atraumatic, no cyanosis or edema and Homans sign is negative, no sign of DVT Wound: still with air leak, chest tube on -10 cm  Lab Results: CBC:No results for input(s): WBC, HGB, HCT, PLT in the last 72 hours. BMET: No results for input(s): NA, K, CL, CO2, GLUCOSE, BUN, CREATININE, CALCIUM in the last 72 hours.  CMET: Lab Results  Component Value Date   WBC 8.7 07/23/2016   HGB 12.1 (L) 07/23/2016   HCT 36.2 (L) 07/23/2016   PLT 179 07/23/2016   GLUCOSE 122 (H) 07/23/2016   CHOL 243 (H) 07/11/2016   TRIG 102 07/11/2016   HDL 75 07/11/2016   LDLCALC 148 (H) 07/11/2016   ALT 17 07/21/2016   AST 30 07/21/2016   NA 133 (L) 07/23/2016   K 3.4 (L) 07/23/2016   CL 98 (L) 07/23/2016   CREATININE 0.61  07/23/2016   BUN 6 07/23/2016   CO2 26 07/23/2016   INR 0.94 07/15/2016      PT/INR: No results for input(s): LABPROT, INR in the last 72 hours. Radiology: Dg Chest Port 1 View  Result Date: 07/26/2016 CLINICAL DATA:  Sore chest.  Chest tube. EXAM: PORTABLE CHEST 1 VIEW COMPARISON:  07/25/2016. FINDINGS: Right chest tubes in stable position. Stable pneumothorax. Bibasilar atelectasis again noted, right side greater than left. Small bilateral pleural effusions. Heart size stable. Persistent right chest wall subcutaneous emphysema. IMPRESSION: 1. Two right chest tubes remain in stable position. Stable apical pneumothorax on the right again noted. Persistent right chest wall  subcutaneous emphysema. 2. Persistent bibasilar atelectasis, right side greater than left. No change. Electronically Signed   By: Marcello Moores  Register   On: 07/26/2016 07:33     Assessment/Plan: S/P Procedure(s) (LRB): VIDEO BRONCHOSCOPY (N/A) VIDEO ASSISTED THORACOSCOPY (VATS) (Right) WEDGE  RESECTION RIGHT UPPER LOBE (Right) LOBECTOMY RIGHT UPPER LOBE WITH WEDGE RESECTION SUPERIOR SEGMENT RIGHT LOWER LOBE WITH PLACEMENT OF ON-Q (Right) LYMPH NODE DISSECTION (Right) Mobilize Ct to remain in place on low suction, still with apical space Waiting for stepdown bed  Voiding ok   [name] 07/26/2016 9:46 AM

## 2016-07-26 NOTE — Plan of Care (Signed)
O2 sat to mid 80's with ambulation, pt SOB.

## 2016-07-27 ENCOUNTER — Inpatient Hospital Stay (HOSPITAL_COMMUNITY): Payer: Managed Care, Other (non HMO)

## 2016-07-27 LAB — GLUCOSE, CAPILLARY
Glucose-Capillary: 114 mg/dL — ABNORMAL HIGH (ref 65–99)
Glucose-Capillary: 125 mg/dL — ABNORMAL HIGH (ref 65–99)
Glucose-Capillary: 133 mg/dL — ABNORMAL HIGH (ref 65–99)
Glucose-Capillary: 149 mg/dL — ABNORMAL HIGH (ref 65–99)
Glucose-Capillary: 181 mg/dL — ABNORMAL HIGH (ref 65–99)
Glucose-Capillary: 83 mg/dL (ref 65–99)
Glucose-Capillary: 93 mg/dL (ref 65–99)

## 2016-07-27 NOTE — Progress Notes (Addendum)
      [address]       [place] [address]             [phone]      8 Days Post-Op Procedure(s) (LRB): VIDEO BRONCHOSCOPY (N/A) VIDEO ASSISTED THORACOSCOPY (VATS) (Right) WEDGE  RESECTION RIGHT UPPER LOBE (Right) LOBECTOMY RIGHT UPPER LOBE WITH WEDGE RESECTION SUPERIOR SEGMENT RIGHT LOWER LOBE WITH PLACEMENT OF ON-Q (Right) LYMPH NODE DISSECTION (Right) Subjective: Feels better now that his chest tube is back to suction. No current shortness of breath.   Objective: Vital signs in last 24 hours: Temp:  [97.5 F (36.4 C)-98.9 F (37.2 C)] 97.5 F (36.4 C) (01/24 0700) Pulse Rate:  [67-84] 73 (01/24 1100) Cardiac Rhythm: Normal sinus rhythm (01/24 0800) Resp:  [10-23] 15 (01/24 1100) BP: (111-131)/(62-87) 115/72 (01/24 1100) SpO2:  [85 %-98 %] 98 % (01/24 1100) FiO2 (%):  [32 %] 32 % (01/24 1001)     Intake/Output from previous day: 01/23 0701 - 01/24 0700 In: 1440 [P.O.:1440] Out: 2370 [Urine:2125; Chest Tube:245] Intake/Output this shift: Total I/O In: 360 [P.O.:360] Out: 160 [Urine:100; Chest Tube:60]  General appearance: alert, cooperative and no distress Heart: regular rate and rhythm, S1, S2 normal, no murmur, click, rub or gallop Lungs: loud right pleural chest tube rub. left chest CTA Abdomen: soft, non-tender; bowel sounds normal; no masses,  no organomegaly Extremities: extremities normal, atraumatic, no cyanosis or edema Wound: clean and dry  Lab Results: No results for input(s): WBC, HGB, HCT, PLT in the last 72 hours. BMET: No results for input(s): NA, K, CL, CO2, GLUCOSE, BUN, CREATININE, CALCIUM in the last 72 hours.  PT/INR: No results for input(s): LABPROT, INR in the last 72 hours. ABG    Component Value Date/Time   PHART 7.392 07/20/2016 0515   HCO3 22.8 07/20/2016 0515   ACIDBASEDEF 1.4 07/20/2016 0515   O2SAT 94.7 07/20/2016 0515   CBG (last 3)   Recent Labs  07/26/16 2351 07/27/16 0345 07/27/16 0807  GLUCAP  138* 93 114*    Assessment/Plan: S/P Procedure(s) (LRB): VIDEO BRONCHOSCOPY (N/A) VIDEO ASSISTED THORACOSCOPY (VATS) (Right) WEDGE  RESECTION RIGHT UPPER LOBE (Right) LOBECTOMY RIGHT UPPER LOBE WITH WEDGE RESECTION SUPERIOR SEGMENT RIGHT LOWER LOBE WITH PLACEMENT OF ON-Q (Right) LYMPH NODE DISSECTION (Right)  1. Pulm- Chest tube back to suction 92mhg. Became short of breath when switched to waterseal. 10% right apical pneumothorax on chest xray. Tolerating 3L of [place] 2. Cardiac- 70s NSR. Good blood pressure.  3. Renal-no new labs. Last creatinine 0.61 4. H and H stable.  5. Blood glucose well controlled.   Plan: keep chest tube to suction. Labs and CXR in the morning.     LOS: 8 days    TElgie Collard1/24/2018  tried ct to water seal and patient says he became uncomfortable so back on 20 for now Apical ptx decreased in size  I have seen and examined Aaron Famand agree with the above assessment  and plan.  EGrace IsaacMD Beeper [phone] [phone]/24/2018 3:14 PM

## 2016-07-27 NOTE — Progress Notes (Signed)
      [address]       [place] [address]             [phone]      POD # 8 RUL  Resting comfortably  CT on suction- failed trial of water seal  Continue current care  Steven C. Roxan Hockey, MD [facility] [phone]

## 2016-07-28 ENCOUNTER — Inpatient Hospital Stay (HOSPITAL_COMMUNITY): Payer: Managed Care, Other (non HMO)

## 2016-07-28 LAB — GLUCOSE, CAPILLARY
Glucose-Capillary: 103 mg/dL — ABNORMAL HIGH (ref 65–99)
Glucose-Capillary: 104 mg/dL — ABNORMAL HIGH (ref 65–99)
Glucose-Capillary: 105 mg/dL — ABNORMAL HIGH (ref 65–99)
Glucose-Capillary: 113 mg/dL — ABNORMAL HIGH (ref 65–99)
Glucose-Capillary: 158 mg/dL — ABNORMAL HIGH (ref 65–99)
Glucose-Capillary: 98 mg/dL (ref 65–99)

## 2016-07-28 LAB — CBC
HCT: 35.2 % — ABNORMAL LOW (ref 39.0–52.0)
HEMOGLOBIN: 11.5 g/dL — AB (ref 13.0–17.0)
MCH: 29.8 pg (ref 26.0–34.0)
MCHC: 32.7 g/dL (ref 30.0–36.0)
MCV: 91.2 fL (ref 78.0–100.0)
Platelets: 284 10*3/uL (ref 150–400)
RBC: 3.86 MIL/uL — AB (ref 4.22–5.81)
RDW: 13.7 % (ref 11.5–15.5)
WBC: 9 10*3/uL (ref 4.0–10.5)

## 2016-07-28 LAB — BASIC METABOLIC PANEL
ANION GAP: 10 (ref 5–15)
BUN: 11 mg/dL (ref 6–20)
CHLORIDE: 93 mmol/L — AB (ref 101–111)
CO2: 28 mmol/L (ref 22–32)
Calcium: 8.9 mg/dL (ref 8.9–10.3)
Creatinine, Ser: 0.61 mg/dL (ref 0.61–1.24)
GFR calc non Af Amer: >60 mL/min (ref >60)
Glucose, Bld: 105 mg/dL — ABNORMAL HIGH (ref 65–99)
POTASSIUM: 4.3 mmol/L (ref 3.5–5.1)
SODIUM: 131 mmol/L — AB (ref 135–145)

## 2016-07-28 MED ORDER — KETOROLAC TROMETHAMINE 15 MG/ML IJ SOLN
15.0000 mg | Freq: Four times a day (QID) | INTRAMUSCULAR | Status: AC | PRN
  Administered 2016-07-28 – 2016-08-01 (×14): 15 mg via INTRAVENOUS
  Filled 2016-07-28 (×14): qty 1

## 2016-07-28 NOTE — Progress Notes (Signed)
Patient ID: [name], male   DOB: 20-Oct-1950, 66 y.o.   MRN: [id number] TCTS DAILY ICU PROGRESS NOTE                   [address]            [place] [address]          [phone]   9 Days Post-Op Procedure(s) (LRB): VIDEO BRONCHOSCOPY (N/A) VIDEO ASSISTED THORACOSCOPY (VATS) (Right) WEDGE  RESECTION RIGHT UPPER LOBE (Right) LOBECTOMY RIGHT UPPER LOBE WITH WEDGE RESECTION SUPERIOR SEGMENT RIGHT LOWER LOBE WITH PLACEMENT OF ON-Q (Right) LYMPH NODE DISSECTION (Right)  Total Length of Stay:  LOS: 9 days   Subjective: Pain around chest tube sites  Objective: Vital signs in last 24 hours: Temp:  [97.9 F (36.6 C)-98.6 F (37 C)] 98.1 F (36.7 C) (01/25 0831) Pulse Rate:  [64-81] 70 (01/25 0600) Cardiac Rhythm: Normal sinus rhythm (01/24 2000) Resp:  [11-25] 15 (01/25 0600) BP: (100-136)/(59-84) 114/64 (01/25 0600) SpO2:  [85 %-99 %] 97 % (01/25 0600) FiO2 (%):  [32 %] 32 % (01/24 1001)  Filed Weights   07/21/16 0400 07/22/16 0400 07/26/16 0600  Weight: 159 lb 6.3 oz (72.3 kg) 157 lb 6.5 oz (71.4 kg) 152 lb 8.9 oz (69.2 kg)    Weight change:    Hemodynamic parameters for last 24 hours:    Intake/Output from previous day: 01/24 0701 - 01/25 0700 In: 1090 [P.O.:1080; I.V.:10] Out: 1935 [Urine:1625; Chest Tube:310]  Intake/Output this shift: Total I/O In: -  Out: 200 [Urine:200]  Current Meds: Scheduled Meds: . bisacodyl  10 mg Oral Daily  . enoxaparin (LOVENOX) injection  40 mg Subcutaneous Q24H  . insulin aspart  0-24 Units Subcutaneous Q4H  . mometasone-formoterol  2 puff Inhalation BID  . senna-docusate  1 tablet Oral QHS  . tamsulosin  0.4 mg Oral QPC breakfast  . tiotropium  1 capsule Inhalation Daily   Continuous Infusions: . dextrose 5 % and 0.45% NaCl Stopped (07/25/16 1100)   PRN Meds:.albuterol, oxyCODONE, potassium chloride (KCL MULTIRUN) 30 mEq in 265 mL IVPB, sodium chloride, sodium chloride flush, traMADol  General  appearance: alert, cooperative and mild distress Neurologic: intact Heart: regular rate and rhythm, S1, S2 normal, no murmur, click, rub or gallop Lungs: diminished breath sounds bibasilar Abdomen: soft, non-tender; bowel sounds normal; no masses,  no organomegaly Extremities: extremities normal, atraumatic, no cyanosis or edema and Homans sign is negative, no sign of DVT Wound: intact  Lab Results: CBC: Recent Labs  07/28/16 0259  WBC 9.0  HGB 11.5*  HCT 35.2*  PLT 284   BMET:  Recent Labs  07/28/16 0259  NA 131*  K 4.3  CL 93*  CO2 28  GLUCOSE 105*  BUN 11  CREATININE 0.61  CALCIUM 8.9    CMET: Lab Results  Component Value Date   WBC 9.0 07/28/2016   HGB 11.5 (L) 07/28/2016   HCT 35.2 (L) 07/28/2016   PLT 284 07/28/2016   GLUCOSE 105 (H) 07/28/2016   CHOL 243 (H) 07/11/2016   TRIG 102 07/11/2016   HDL 75 07/11/2016   LDLCALC 148 (H) 07/11/2016   ALT 17 07/21/2016   AST 30 07/21/2016   NA 131 (L) 07/28/2016   K 4.3 07/28/2016   CL 93 (L) 07/28/2016   CREATININE 0.61 07/28/2016   BUN 11 07/28/2016   CO2 28 07/28/2016   INR 0.94 07/15/2016      PT/INR: No results for  input(s): LABPROT, INR in the last 72 hours. Radiology: Dg Chest Port 1 View  Result Date: 07/28/2016 CLINICAL DATA:  Pneumothorax. EXAM: PORTABLE CHEST 1 VIEW COMPARISON:  07/27/2016 . FINDINGS: Right chest tubes in stable position. Tiny right apical pneumothorax unchanged. Small right base pneumothorax cannot be excluded. Changes may be related to subsegmental atelectasis. Low lung volumes with bibasilar atelectasis. Heart size stable right chest wall subcutaneous emphysema again noted . IMPRESSION: 1. Right chest tubes in stable position. Tiny right apical pneumothorax again noted. No significant change. Tiny right base pneumothorax cannot be excluded. Persistent right chest wall subcutaneous emphysema. 2. Low lung volumes with basilar atelectasis . Electronically Signed   By: Marcello Moores  Register    On: 07/28/2016 07:39     Assessment/Plan: S/P Procedure(s) (LRB): VIDEO BRONCHOSCOPY (N/A) VIDEO ASSISTED THORACOSCOPY (VATS) (Right) WEDGE  RESECTION RIGHT UPPER LOBE (Right) LOBECTOMY RIGHT UPPER LOBE WITH WEDGE RESECTION SUPERIOR SEGMENT RIGHT LOWER LOBE WITH PLACEMENT OF ON-Q (Right) LYMPH NODE DISSECTION (Right) Mobilize Still with continuous air leak on suction, chest xray improved today, most of leak from anterior chest tube but some from posterior, will try off suction again,  Adjust pain meds add toradol   [name] 07/28/2016 8:46 AM

## 2016-07-28 NOTE — Progress Notes (Signed)
Patient ID: [name], male   DOB: [birth year], 66 y.o.   MRN: [id number]  SICU evening Rounds:  Hemodynamically stable today.  Chest tube to water seal today.  Pain under better control with Toradol.

## 2016-07-29 ENCOUNTER — Inpatient Hospital Stay (HOSPITAL_COMMUNITY): Payer: Managed Care, Other (non HMO)

## 2016-07-29 LAB — GLUCOSE, CAPILLARY
Glucose-Capillary: 100 mg/dL — ABNORMAL HIGH (ref 65–99)
Glucose-Capillary: 106 mg/dL — ABNORMAL HIGH (ref 65–99)
Glucose-Capillary: 140 mg/dL — ABNORMAL HIGH (ref 65–99)

## 2016-07-29 LAB — BASIC METABOLIC PANEL
Anion gap: 10 (ref 5–15)
BUN: 14 mg/dL (ref 6–20)
CO2: 28 mmol/L (ref 22–32)
Calcium: 8.9 mg/dL (ref 8.9–10.3)
Chloride: 93 mmol/L — ABNORMAL LOW (ref 101–111)
Creatinine, Ser: 0.65 mg/dL (ref 0.61–1.24)
GFR calc Af Amer: >60 mL/min (ref >60)
GFR calc non Af Amer: >60 mL/min (ref >60)
Glucose, Bld: 104 mg/dL — ABNORMAL HIGH (ref 65–99)
Potassium: 4.2 mmol/L (ref 3.5–5.1)
Sodium: 131 mmol/L — ABNORMAL LOW (ref 135–145)

## 2016-07-29 LAB — CBC
HCT: 36.5 % — ABNORMAL LOW (ref 39.0–52.0)
Hemoglobin: 12 g/dL — ABNORMAL LOW (ref 13.0–17.0)
MCH: 29.9 pg (ref 26.0–34.0)
MCHC: 32.9 g/dL (ref 30.0–36.0)
MCV: 90.8 fL (ref 78.0–100.0)
Platelets: 334 10*3/uL (ref 150–400)
RBC: 4.02 MIL/uL — ABNORMAL LOW (ref 4.22–5.81)
RDW: 13.4 % (ref 11.5–15.5)
WBC: 10.5 10*3/uL (ref 4.0–10.5)

## 2016-07-29 NOTE — Progress Notes (Signed)
Patient ID: [name], male   DOB: [birth year], 66 y.o.   MRN: [id number] EVENING ROUNDS NOTE :     [address]       [place] [address]             [phone]                 10 Days Post-Op Procedure(s) (LRB): VIDEO BRONCHOSCOPY (N/A) VIDEO ASSISTED THORACOSCOPY (VATS) (Right) WEDGE  RESECTION RIGHT UPPER LOBE (Right) LOBECTOMY RIGHT UPPER LOBE WITH WEDGE RESECTION SUPERIOR SEGMENT RIGHT LOWER LOBE WITH PLACEMENT OF ON-Q (Right) LYMPH NODE DISSECTION (Right)  Total Length of Stay:  LOS: 10 days  BP 119/71 (BP Location: Left Arm)   Pulse 68   Temp 97.7 F (36.5 C) (Oral)   Resp 15   Ht 6' (1.829 m)   Wt 148 lb 13 oz (67.5 kg)   SpO2 98%   BMI 20.18 kg/m   .Intake/Output      01/26 0701 - 01/27 0700   P.O. 840   I.V. (mL/kg)    Total Intake(mL/kg) 840 (12.4)   Urine (mL/kg/hr) 475 (0.5)   Stool    Chest Tube    Total Output 475   Net +365       Urine Occurrence 1 x     . dextrose 5 % and 0.45% NaCl Stopped (07/25/16 1100)     Lab Results  Component Value Date   WBC 10.5 07/29/2016   HGB 12.0 (L) 07/29/2016   HCT 36.5 (L) 07/29/2016   PLT 334 07/29/2016   GLUCOSE 104 (H) 07/29/2016   CHOL 243 (H) 07/11/2016   TRIG 102 07/11/2016   HDL 75 07/11/2016   LDLCALC 148 (H) 07/11/2016   ALT 17 07/21/2016   AST 30 07/21/2016   NA 131 (L) 07/29/2016   K 4.2 07/29/2016   CL 93 (L) 07/29/2016   CREATININE 0.65 07/29/2016   BUN 14 07/29/2016   CO2 28 07/29/2016   INR 0.94 07/15/2016   Dg Chest Port 1 View  Result Date: 07/29/2016 CLINICAL DATA:  66 year old male with a history of VATS and right upper lobectomy with nodal dissection performed 07/19/2016 EXAM: PORTABLE CHEST 1 VIEW COMPARISON:  07/29/2016, 07/28/2016 FINDINGS: Cardiomediastinal silhouette is unchanged. Unchanged position of 2 left-sided thoracostomy tubes. In the interval there is improved appearance of the right-sided pneumothorax, though persists. No left-sided  pneumothorax. Coarsened interstitial markings. Likely atelectatic changes at the right lung base. Surgical changes in the right suprahilar region. IMPRESSION: Interval improvement in the appearance of right-sided pneumothorax. Unchanged right-sided thoracostomy tubes. Surgical changes in the right suprahilar region with atelectatic changes at the right lung base. Signed, Dulcy Fanny. Earleen Newport, DO [facility] Radiology Electronically Signed   By: Corrie Mckusick D.O.   On: 07/29/2016 15:48   Dg Chest Port 1 View  Result Date: 07/29/2016 CLINICAL DATA:  Chest tube placement. EXAM: PORTABLE CHEST 1 VIEW COMPARISON:  Radiograph July 28, 2016. FINDINGS: The heart size and mediastinal contours are within normal limits. Left lung is clear. Two right-sided chest tubes are unchanged in position. However, there is significant enlargement of right pneumothorax to be greater than 50%. Stable subcutaneous emphysema is seen over right lateral chest wall. The visualized skeletal structures are unremarkable. IMPRESSION: Two right-sided chest tubes are unchanged in position. Stable subcutaneous emphysema over right lateral chest wall. Right pneumothorax is significantly larger compared to prior exam. These results will be called to the ordering  clinician or representative by the Radiologist Assistant, and communication documented in the PACS or zVision Dashboard. Electronically Signed   By: Marijo Conception, M.D.   On: 07/29/2016 09:05     Lung collapse on water seal, back on suction    [name] MD  Beeper [phone] Office [phone] 07/29/2016 9:22 PM

## 2016-07-29 NOTE — Progress Notes (Addendum)
TCTS DAILY ICU PROGRESS NOTE                   [address]            [place] [address]          [phone]   10 Days Post-Op Procedure(s) (LRB): VIDEO BRONCHOSCOPY (N/A) VIDEO ASSISTED THORACOSCOPY (VATS) (Right) WEDGE  RESECTION RIGHT UPPER LOBE (Right) LOBECTOMY RIGHT UPPER LOBE WITH WEDGE RESECTION SUPERIOR SEGMENT RIGHT LOWER LOBE WITH PLACEMENT OF ON-Q (Right) LYMPH NODE DISSECTION (Right)  Total Length of Stay:  LOS: 10 days   Subjective: Patient walked about 300 feet earlier today.  Objective: Vital signs in last 24 hours: Temp:  [97.8 F (36.6 C)-98.6 F (37 C)] 97.8 F (36.6 C) (01/26 1150) Pulse Rate:  [66-92] 77 (01/26 1000) Cardiac Rhythm: Normal sinus rhythm (01/26 0800) Resp:  [14-23] 23 (01/26 1000) BP: (116-137)/(68-79) 123/72 (01/26 1000) SpO2:  [88 %-99 %] 99 % (01/26 1000) Weight:  [148 lb 13 oz (67.5 kg)] 148 lb 13 oz (67.5 kg) (01/26 0500)  Filed Weights   07/22/16 0400 07/26/16 0600 07/29/16 0500  Weight: 157 lb 6.5 oz (71.4 kg) 152 lb 8.9 oz (69.2 kg) 148 lb 13 oz (67.5 kg)      Intake/Output from previous day: 01/25 0701 - 01/26 0700 In: 2119 [P.O.:1410; I.V.:10] Out: 1490 [Urine:1400; Chest Tube:90]  Intake/Output this shift: Total I/O In: -  Out: 150 [Urine:150]  Current Meds: Scheduled Meds: . bisacodyl  10 mg Oral Daily  . enoxaparin (LOVENOX) injection  40 mg Subcutaneous Q24H  . insulin aspart  0-24 Units Subcutaneous Q4H  . mometasone-formoterol  2 puff Inhalation BID  . senna-docusate  1 tablet Oral QHS  . tamsulosin  0.4 mg Oral QPC breakfast  . tiotropium  1 capsule Inhalation Daily   Continuous Infusions: . dextrose 5 % and 0.45% NaCl Stopped (07/25/16 1100)   PRN Meds:.albuterol, ketorolac, oxyCODONE, potassium chloride (KCL MULTIRUN) 30 mEq in 265 mL IVPB, sodium chloride, sodium chloride flush, traMADol  General appearance: alert, cooperative and no distress Heart: RRR Lungs: Diminished on  right and mostly clear on the left Abdomen: soft, non-tender; bowel sounds normal; no masses,  no organomegaly Extremities: No LE edema Wound: Dressing is clean and dry  Lab Results: CBC: Recent Labs  07/28/16 0259 07/29/16 0247  WBC 9.0 10.5  HGB 11.5* 12.0*  HCT 35.2* 36.5*  PLT 284 334   BMET:  Recent Labs  07/28/16 0259 07/29/16 0247  NA 131* 131*  K 4.3 4.2  CL 93* 93*  CO2 28 28  GLUCOSE 105* 104*  BUN 11 14  CREATININE 0.61 0.65  CALCIUM 8.9 8.9    CMET: Lab Results  Component Value Date   WBC 10.5 07/29/2016   HGB 12.0 (L) 07/29/2016   HCT 36.5 (L) 07/29/2016   PLT 334 07/29/2016   GLUCOSE 104 (H) 07/29/2016   CHOL 243 (H) 07/11/2016   TRIG 102 07/11/2016   HDL 75 07/11/2016   LDLCALC 148 (H) 07/11/2016   ALT 17 07/21/2016   AST 30 07/21/2016   NA 131 (L) 07/29/2016   K 4.2 07/29/2016   CL 93 (L) 07/29/2016   CREATININE 0.65 07/29/2016   BUN 14 07/29/2016   CO2 28 07/29/2016   INR 0.94 07/15/2016      PT/INR: No results for input(s): LABPROT, INR in the last 72 hours. Radiology: Dg Chest Port 1 View  Result Date: 07/29/2016 CLINICAL DATA:  Chest tube placement. EXAM: PORTABLE CHEST 1 VIEW COMPARISON:  Radiograph July 28, 2016. FINDINGS: The heart size and mediastinal contours are within normal limits. Left lung is clear. Two right-sided chest tubes are unchanged in position. However, there is significant enlargement of right pneumothorax to be greater than 50%. Stable subcutaneous emphysema is seen over right lateral chest wall. The visualized skeletal structures are unremarkable. IMPRESSION: Two right-sided chest tubes are unchanged in position. Stable subcutaneous emphysema over right lateral chest wall. Right pneumothorax is significantly larger compared to prior exam. These results will be called to the ordering clinician or representative by the Radiologist Assistant, and communication documented in the PACS or zVision Dashboard.  Electronically Signed   By: Marijo Conception, M.D.   On: 07/29/2016 09:05     Assessment/Plan: S/P Procedure(s) (LRB): VIDEO BRONCHOSCOPY (N/A) VIDEO ASSISTED THORACOSCOPY (VATS) (Right) WEDGE  RESECTION RIGHT UPPER LOBE (Right) LOBECTOMY RIGHT UPPER LOBE WITH WEDGE RESECTION SUPERIOR SEGMENT RIGHT LOWER LOBE WITH PLACEMENT OF ON-Q (Right) LYMPH NODE DISSECTION (Right)  1.CV- 2.Pulmonary-On 4 liters of oxygen via [place]. Chest tube put back on suction this am as CXR showed stable subcutaneous emphysema over right lateral chest wall and enlargement of right pneumothorax. Chest tube has an air leak that worsens with cough. Encourage incentive spirometer.Dr. Servando Snare did discuss he may need EBV. Will obtain a CXR at 3 pm today and in am. Encourage incentive spirometer. 3. Mild anemia-H and H stable at 12 and 36.5 4. CBGs 100/106/140. No history of diabetes. Stop SS and accu checks.   Nani Skillern PA-C 07/29/2016 12:46 PM    I have seen and examined [name] and agree with the above assessment  and plan.  [name] MD Beeper [phone] Office [phone] 07/29/2016 9:24 PM

## 2016-07-30 ENCOUNTER — Inpatient Hospital Stay (HOSPITAL_COMMUNITY): Payer: Managed Care, Other (non HMO)

## 2016-07-30 NOTE — Progress Notes (Addendum)
TCTS DAILY ICU PROGRESS NOTE                   [address]            [place] [address]          [phone]   11 Days Post-Op Procedure(s) (LRB): VIDEO BRONCHOSCOPY (N/A) VIDEO ASSISTED THORACOSCOPY (VATS) (Right) WEDGE  RESECTION RIGHT UPPER LOBE (Right) LOBECTOMY RIGHT UPPER LOBE WITH WEDGE RESECTION SUPERIOR SEGMENT RIGHT LOWER LOBE WITH PLACEMENT OF ON-Q (Right) LYMPH NODE DISSECTION (Right)  Total Length of Stay:  LOS: 11 days   Subjective: Not SOB, large air leak persists  Objective: Vital signs in last 24 hours: Temp:  [97.4 F (36.3 C)-98.3 F (36.8 C)] 98.3 F (36.8 C) (01/27 1500) Pulse Rate:  [61-81] 64 (01/27 1500) Cardiac Rhythm: Normal sinus rhythm (01/27 1200) Resp:  [11-29] 17 (01/27 1400) BP: (101-119)/(58-77) 107/65 (01/27 1400) SpO2:  [89 %-100 %] 97 % (01/27 1500)  Filed Weights   07/22/16 0400 07/26/16 0600 07/29/16 0500  Weight: 157 lb 6.5 oz (71.4 kg) 152 lb 8.9 oz (69.2 kg) 148 lb 13 oz (67.5 kg)    Weight change:    Hemodynamic parameters for last 24 hours:    Intake/Output from previous day: 01/26 0701 - 01/27 0700 In: 840 [P.O.:840] Out: 1100 [Urine:1100]  Intake/Output this shift: Total I/O In: 240 [P.O.:240] Out: 240 [Urine:220; Chest Tube:20]  Current Meds: Scheduled Meds: . bisacodyl  10 mg Oral Daily  . enoxaparin (LOVENOX) injection  40 mg Subcutaneous Q24H  . mometasone-formoterol  2 puff Inhalation BID  . senna-docusate  1 tablet Oral QHS  . tamsulosin  0.4 mg Oral QPC breakfast  . tiotropium  1 capsule Inhalation Daily   Continuous Infusions: . dextrose 5 % and 0.45% NaCl Stopped (07/25/16 1100)   PRN Meds:.albuterol, ketorolac, oxyCODONE, potassium chloride (KCL MULTIRUN) 30 mEq in 265 mL IVPB, sodium chloride, sodium chloride flush, traMADol  General appearance: alert, cooperative and no distress Heart: regular rate and rhythm Lungs: coarse with ronchi on right lower fields Abdomen:  benign Extremities: no edema  Wound: incis healing well  Lab Results: CBC: Recent Labs  07/28/16 0259 07/29/16 0247  WBC 9.0 10.5  HGB 11.5* 12.0*  HCT 35.2* 36.5*  PLT 284 334   BMET:  Recent Labs  07/28/16 0259 07/29/16 0247  NA 131* 131*  K 4.3 4.2  CL 93* 93*  CO2 28 28  GLUCOSE 105* 104*  BUN 11 14  CREATININE 0.61 0.65  CALCIUM 8.9 8.9    CMET: Lab Results  Component Value Date   WBC 10.5 07/29/2016   HGB 12.0 (L) 07/29/2016   HCT 36.5 (L) 07/29/2016   PLT 334 07/29/2016   GLUCOSE 104 (H) 07/29/2016   CHOL 243 (H) 07/11/2016   TRIG 102 07/11/2016   HDL 75 07/11/2016   LDLCALC 148 (H) 07/11/2016   ALT 17 07/21/2016   AST 30 07/21/2016   NA 131 (L) 07/29/2016   K 4.2 07/29/2016   CL 93 (L) 07/29/2016   CREATININE 0.65 07/29/2016   BUN 14 07/29/2016   CO2 28 07/29/2016   INR 0.94 07/15/2016      PT/INR: No results for input(s): LABPROT, INR in the last 72 hours. Radiology: Dg Chest Port 1 View  Result Date: 07/30/2016 CLINICAL DATA:  Pneumothorax EXAM: PORTABLE CHEST 1 VIEW COMPARISON:  07/29/2016 FINDINGS: Right chest tubes remain in place, unchanged. Moderate right apical pneumothorax again noted, stable.  Minimal bibasilar atelectasis. Heart is normal size. Stable subcutaneous emphysema in the right chest wall. IMPRESSION: Stable size of the right pneumothorax with right chest tubes in stable position. Electronically Signed   By: Rolm Baptise M.D.   On: 07/30/2016 07:31     Assessment/Plan: S/P Procedure(s) (LRB): VIDEO BRONCHOSCOPY (N/A) VIDEO ASSISTED THORACOSCOPY (VATS) (Right) WEDGE  RESECTION RIGHT UPPER LOBE (Right) LOBECTOMY RIGHT UPPER LOBE WITH WEDGE RESECTION SUPERIOR SEGMENT RIGHT LOWER LOBE WITH PLACEMENT OF ON-Q (Right) LYMPH NODE DISSECTION (Right)  1 remains quite stable , but will probably require bronchial valves placed   GOLD,WAYNE E 07/30/2016 3:51 PM   Attempt at water seal resulted in increased ptx, now on 10 suction  but leak increased  Discussed with patient placement of IBV valves  I have seen and examined Kelby Fam and agree with the above assessment  and plan.  [name] MD Beeper [phone] Office [phone] 07/30/2016 4:55 PM

## 2016-07-30 NOTE — Plan of Care (Signed)
Problem: Education: Goal: Knowledge of the prescribed therapeutic regimen will improve Outcome: Progressing Pt provided with Manufacturers information on IBV placement. Questions answered and emotional support given. Dr Servando Snare discussed placement on 08-01-16.

## 2016-07-31 ENCOUNTER — Inpatient Hospital Stay (HOSPITAL_COMMUNITY): Payer: Managed Care, Other (non HMO)

## 2016-07-31 NOTE — Progress Notes (Signed)
Patient ID: [name], male   DOB: [birth year], 66 y.o.   MRN: [id number] EVENING ROUNDS NOTE :     [address]       ,[place] [address]             [phone]                 12 Days Post-Op Procedure(s) (LRB): VIDEO BRONCHOSCOPY (N/A) VIDEO ASSISTED THORACOSCOPY (VATS) (Right) WEDGE  RESECTION RIGHT UPPER LOBE (Right) LOBECTOMY RIGHT UPPER LOBE WITH WEDGE RESECTION SUPERIOR SEGMENT RIGHT LOWER LOBE WITH PLACEMENT OF ON-Q (Right) LYMPH NODE DISSECTION (Right)  Total Length of Stay:  LOS: 12 days  BP (!) 114/56 (BP Location: Left Arm)   Pulse 63   Temp 97.5 F (36.4 C) (Oral)   Resp 15   Ht 6' (1.829 m)   Wt 147 lb 11.3 oz (67 kg)   SpO2 97%   BMI 20.03 kg/m   .Intake/Output      01/27 0701 - 01/28 0700 01/28 0701 - 01/29 0700   P.O. 1320    Total Intake(mL/kg) 1320 (19.7)    Urine (mL/kg/hr) 1070 (0.7) 250 (0.4)   Chest Tube 50 (0) 50 (0.1)   Total Output 1120 300   Net +200 -300          . dextrose 5 % and 0.45% NaCl Stopped (07/25/16 1100)     Lab Results  Component Value Date   WBC 10.5 07/29/2016   HGB 12.0 (L) 07/29/2016   HCT 36.5 (L) 07/29/2016   PLT 334 07/29/2016   GLUCOSE 104 (H) 07/29/2016   CHOL 243 (H) 07/11/2016   TRIG 102 07/11/2016   HDL 75 07/11/2016   LDLCALC 148 (H) 07/11/2016   ALT 17 07/21/2016   AST 30 07/21/2016   NA 131 (L) 07/29/2016   K 4.2 07/29/2016   CL 93 (L) 07/29/2016   CREATININE 0.65 07/29/2016   BUN 14 07/29/2016   CO2 28 07/29/2016   INR 0.94 07/15/2016   Stable day  [name] MD  Beeper [phone] Office [phone] 07/31/2016 4:43 PM

## 2016-07-31 NOTE — Progress Notes (Signed)
Patient ID: [name], male   DOB: [birth year], 66 y.o.   MRN: [id number] TCTS DAILY ICU PROGRESS NOTE                   [address]            [place] [address]          [phone]   12 Days Post-Op Procedure(s) (LRB): VIDEO BRONCHOSCOPY (N/A) VIDEO ASSISTED THORACOSCOPY (VATS) (Right) WEDGE  RESECTION RIGHT UPPER LOBE (Right) LOBECTOMY RIGHT UPPER LOBE WITH WEDGE RESECTION SUPERIOR SEGMENT RIGHT LOWER LOBE WITH PLACEMENT OF ON-Q (Right) LYMPH NODE DISSECTION (Right)  Total Length of Stay:  LOS: 12 days   Subjective: Patient has no specific complaints, still with air leak  Objective: Vital signs in last 24 hours: Temp:  [97.9 F (36.6 C)-98.3 F (36.8 C)] 98.1 F (36.7 C) (01/28 0800) Pulse Rate:  [60-91] 78 (01/28 0900) Cardiac Rhythm: Normal sinus rhythm (01/28 0800) Resp:  [11-28] 17 (01/28 0900) BP: (96-133)/(55-74) 111/61 (01/28 0800) SpO2:  [92 %-100 %] 98 % (01/28 0900) Weight:  [147 lb 11.3 oz (67 kg)] 147 lb 11.3 oz (67 kg) (01/28 0600)  Filed Weights   07/26/16 0600 07/29/16 0500 07/31/16 0600  Weight: 152 lb 8.9 oz (69.2 kg) 148 lb 13 oz (67.5 kg) 147 lb 11.3 oz (67 kg)    Weight change:    Hemodynamic parameters for last 24 hours:    Intake/Output from previous day: 01/27 0701 - 01/28 0700 In: 1320 [P.O.:1320] Out: 1120 [Urine:1070; Chest Tube:50]  Intake/Output this shift: Total I/O In: -  Out: 20 [Chest Tube:20]  Current Meds: Scheduled Meds: . bisacodyl  10 mg Oral Daily  . enoxaparin (LOVENOX) injection  40 mg Subcutaneous Q24H  . mometasone-formoterol  2 puff Inhalation BID  . senna-docusate  1 tablet Oral QHS  . tamsulosin  0.4 mg Oral QPC breakfast  . tiotropium  1 capsule Inhalation Daily   Continuous Infusions: . dextrose 5 % and 0.45% NaCl Stopped (07/25/16 1100)   PRN Meds:.albuterol, ketorolac, oxyCODONE, potassium chloride (KCL MULTIRUN) 30 mEq in 265 mL IVPB, sodium chloride, sodium chloride flush,  traMADol  General appearance: alert and cooperative Neurologic: intact Heart: regular rate and rhythm, S1, S2 normal, no murmur, click, rub or gallop Lungs: diminished breath sounds bibasilar Abdomen: soft, non-tender; bowel sounds normal; no masses,  no organomegaly Extremities: extremities normal, atraumatic, no cyanosis or edema and Homans sign is negative, no sign of DVT Wound: Wounds healed well chest tubes are in place, majority of the air leak is from the anterior chest tube  Lab Results: CBC: Recent Labs  07/29/16 0247  WBC 10.5  HGB 12.0*  HCT 36.5*  PLT 334   BMET:  Recent Labs  07/29/16 0247  NA 131*  K 4.2  CL 93*  CO2 28  GLUCOSE 104*  BUN 14  CREATININE 0.65  CALCIUM 8.9    CMET: Lab Results  Component Value Date   WBC 10.5 07/29/2016   HGB 12.0 (L) 07/29/2016   HCT 36.5 (L) 07/29/2016   PLT 334 07/29/2016   GLUCOSE 104 (H) 07/29/2016   CHOL 243 (H) 07/11/2016   TRIG 102 07/11/2016   HDL 75 07/11/2016   LDLCALC 148 (H) 07/11/2016   ALT 17 07/21/2016   AST 30 07/21/2016   NA 131 (L) 07/29/2016   K 4.2 07/29/2016   CL 93 (L) 07/29/2016   CREATININE 0.65 07/29/2016   BUN 14 07/29/2016  CO2 28 07/29/2016   INR 0.94 07/15/2016      PT/INR: No results for input(s): LABPROT, INR in the last 72 hours. Radiology: Dg Chest Port 1 View  Result Date: 07/31/2016 CLINICAL DATA:  Chest tube EXAM: PORTABLE CHEST 1 VIEW COMPARISON:  07/30/2016 FINDINGS: Two right chest tubes remain in place with small to moderate-sized right apical pneumothorax, stable. Increasing right base atelectasis. Left lung is clear. Heart is normal size. IMPRESSION: Two right chest tubes with small to moderate right apical pneumothorax, stable. Increasing right base atelectasis. Electronically Signed   By: Rolm Baptise M.D.   On: 07/31/2016 08:27     Assessment/Plan: S/P Procedure(s) (LRB): VIDEO BRONCHOSCOPY (N/A) VIDEO ASSISTED THORACOSCOPY (VATS) (Right) WEDGE  RESECTION  RIGHT UPPER LOBE (Right) LOBECTOMY RIGHT UPPER LOBE WITH WEDGE RESECTION SUPERIOR SEGMENT RIGHT LOWER LOBE WITH PLACEMENT OF ON-Q (Right) LYMPH NODE DISSECTION (Right)  Persistent airleak. I discussed with the patient and his wife proceeding with bronchoscopy and placement of intrabronchial valves under general anesthesia tomorrow. Risks and options are discussed with patient in detail his questions have been answered and he is willing to proceed. He understands that if there has not significantly improvement with bronchial valves repeat right lung surgery may be necessary .    [name] 07/31/2016 9:09 AM

## 2016-08-01 ENCOUNTER — Encounter (HOSPITAL_COMMUNITY): Payer: Self-pay | Admitting: Certified Registered Nurse Anesthetist

## 2016-08-01 ENCOUNTER — Inpatient Hospital Stay (HOSPITAL_COMMUNITY): Payer: Managed Care, Other (non HMO) | Admitting: Certified Registered Nurse Anesthetist

## 2016-08-01 ENCOUNTER — Encounter (HOSPITAL_COMMUNITY)
Admission: RE | Disposition: A | Discharge status: Home Health | Payer: Self-pay | Source: Ambulatory Visit | Attending: Cardiothoracic Surgery

## 2016-08-01 ENCOUNTER — Inpatient Hospital Stay (HOSPITAL_COMMUNITY): Payer: Managed Care, Other (non HMO)

## 2016-08-01 DIAGNOSIS — J95812 Postprocedural air leak: Secondary | ICD-10-CM | POA: Diagnosis not present

## 2016-08-01 HISTORY — PX: VIDEO BRONCHOSCOPY WITH INSERTION OF INTERBRONCHIAL VALVE (IBV): SHX6178

## 2016-08-01 LAB — BASIC METABOLIC PANEL
Anion gap: 7 (ref 5–15)
BUN: 17 mg/dL (ref 6–20)
CO2: 28 mmol/L (ref 22–32)
Calcium: 8.9 mg/dL (ref 8.9–10.3)
Chloride: 100 mmol/L — ABNORMAL LOW (ref 101–111)
Creatinine, Ser: 0.79 mg/dL (ref 0.61–1.24)
GFR calc Af Amer: >60 mL/min (ref >60)
GFR calc non Af Amer: >60 mL/min (ref >60)
Glucose, Bld: 101 mg/dL — ABNORMAL HIGH (ref 65–99)
Potassium: 4.5 mmol/L (ref 3.5–5.1)
Sodium: 135 mmol/L (ref 135–145)

## 2016-08-01 LAB — CBC
HCT: 35.4 % — ABNORMAL LOW (ref 39.0–52.0)
Hemoglobin: 11.5 g/dL — ABNORMAL LOW (ref 13.0–17.0)
MCH: 30 pg (ref 26.0–34.0)
MCHC: 32.5 g/dL (ref 30.0–36.0)
MCV: 92.4 fL (ref 78.0–100.0)
Platelets: 361 10*3/uL (ref 150–400)
RBC: 3.83 MIL/uL — ABNORMAL LOW (ref 4.22–5.81)
RDW: 13.5 % (ref 11.5–15.5)
WBC: 10.6 10*3/uL — ABNORMAL HIGH (ref 4.0–10.5)

## 2016-08-01 SURGERY — BRONCHOSCOPY, FLEXIBLE, WITH INTRABRONCHIAL VALVE INSERTION
Anesthesia: General | Laterality: Right

## 2016-08-01 MED ORDER — DEXAMETHASONE SODIUM PHOSPHATE 10 MG/ML IJ SOLN
INTRAMUSCULAR | Status: DC | PRN
  Administered 2016-08-01: 10 mg via INTRAVENOUS

## 2016-08-01 MED ORDER — DEXTROSE 5 % IV SOLN
INTRAVENOUS | Status: DC | PRN
  Administered 2016-08-01: 1 g via INTRAVENOUS

## 2016-08-01 MED ORDER — PROPOFOL 10 MG/ML IV BOLUS
INTRAVENOUS | Status: DC | PRN
  Administered 2016-08-01: 180 mg via INTRAVENOUS

## 2016-08-01 MED ORDER — PHENYLEPHRINE 40 MCG/ML (10ML) SYRINGE FOR IV PUSH (FOR BLOOD PRESSURE SUPPORT)
PREFILLED_SYRINGE | INTRAVENOUS | Status: AC
  Filled 2016-08-01: qty 10

## 2016-08-01 MED ORDER — ONDANSETRON HCL 4 MG/2ML IJ SOLN
INTRAMUSCULAR | Status: AC
Start: 2016-08-01 — End: 2016-08-01
  Filled 2016-08-01: qty 2

## 2016-08-01 MED ORDER — PROPOFOL 10 MG/ML IV BOLUS
INTRAVENOUS | Status: AC
  Filled 2016-08-01: qty 20

## 2016-08-01 MED ORDER — LIDOCAINE HCL (CARDIAC) 20 MG/ML IV SOLN
INTRAVENOUS | Status: DC | PRN
  Administered 2016-08-01: 100 mg via INTRAVENOUS

## 2016-08-01 MED ORDER — HEPARIN SODIUM (PORCINE) 1000 UNIT/ML IJ SOLN
INTRAMUSCULAR | Status: AC
  Filled 2016-08-01: qty 1

## 2016-08-01 MED ORDER — DEXAMETHASONE SODIUM PHOSPHATE 10 MG/ML IJ SOLN
INTRAMUSCULAR | Status: AC
  Filled 2016-08-01: qty 1

## 2016-08-01 MED ORDER — SUGAMMADEX SODIUM 200 MG/2ML IV SOLN
INTRAVENOUS | Status: DC | PRN
  Administered 2016-08-01: 200 mg via INTRAVENOUS

## 2016-08-01 MED ORDER — PHENYLEPHRINE HCL 10 MG/ML IJ SOLN
INTRAMUSCULAR | Status: DC | PRN
  Administered 2016-08-01: 25 ug/min via INTRAVENOUS

## 2016-08-01 MED ORDER — ROCURONIUM BROMIDE 50 MG/5ML IV SOSY
PREFILLED_SYRINGE | INTRAVENOUS | Status: AC
  Filled 2016-08-01: qty 5

## 2016-08-01 MED ORDER — ROCURONIUM BROMIDE 100 MG/10ML IV SOLN
INTRAVENOUS | Status: DC | PRN
  Administered 2016-08-01: 10 mg via INTRAVENOUS
  Administered 2016-08-01: 50 mg via INTRAVENOUS

## 2016-08-01 MED ORDER — 0.9 % SODIUM CHLORIDE (POUR BTL) OPTIME
TOPICAL | Status: DC | PRN
  Administered 2016-08-01: 1000 mL

## 2016-08-01 MED ORDER — ONDANSETRON HCL 4 MG/2ML IJ SOLN
INTRAMUSCULAR | Status: AC
  Filled 2016-08-01: qty 2

## 2016-08-01 MED ORDER — LIDOCAINE HCL 4 % EX SOLN
CUTANEOUS | Status: DC | PRN
  Administered 2016-08-01: 2 mL via TOPICAL

## 2016-08-01 MED ORDER — FENTANYL CITRATE (PF) 250 MCG/5ML IJ SOLN
INTRAMUSCULAR | Status: AC
  Filled 2016-08-01: qty 5

## 2016-08-01 MED ORDER — HEPARIN SODIUM (PORCINE) 1000 UNIT/ML IJ SOLN
INTRAMUSCULAR | Status: AC
  Filled 2016-08-01: qty 2

## 2016-08-01 MED ORDER — MIDAZOLAM HCL 2 MG/2ML IJ SOLN
INTRAMUSCULAR | Status: AC
  Filled 2016-08-01: qty 2

## 2016-08-01 MED ORDER — CEFAZOLIN IN D5W 1 GM/50ML IV SOLN
INTRAVENOUS | Status: AC
  Filled 2016-08-01: qty 50

## 2016-08-01 MED ORDER — HYDROMORPHONE HCL 1 MG/ML IJ SOLN: 0.2500 mg | INTRAMUSCULAR | Status: DC | PRN

## 2016-08-01 MED ORDER — ONDANSETRON HCL 4 MG/2ML IJ SOLN: 4.0000 mg | Freq: Once | INTRAMUSCULAR | Status: DC | PRN

## 2016-08-01 MED ORDER — MEPERIDINE HCL 25 MG/ML IJ SOLN: 6.2500 mg | INTRAMUSCULAR | Status: DC | PRN

## 2016-08-01 MED ORDER — PROTAMINE SULFATE 10 MG/ML IV SOLN
INTRAVENOUS | Status: AC
  Filled 2016-08-01: qty 20

## 2016-08-01 MED ORDER — ONDANSETRON HCL 4 MG/2ML IJ SOLN
INTRAMUSCULAR | Status: DC | PRN
  Administered 2016-08-01: 4 mg via INTRAVENOUS

## 2016-08-01 MED ORDER — PROTAMINE SULFATE 10 MG/ML IV SOLN
INTRAVENOUS | Status: AC
  Filled 2016-08-01: qty 25

## 2016-08-01 MED ORDER — ALBUTEROL SULFATE HFA 108 (90 BASE) MCG/ACT IN AERS
INHALATION_SPRAY | RESPIRATORY_TRACT | Status: AC
  Filled 2016-08-01: qty 6.7

## 2016-08-01 MED ORDER — LACTATED RINGERS IV SOLN
INTRAVENOUS | Status: DC
  Administered 2016-08-01 – 2016-08-04 (×5): via INTRAVENOUS

## 2016-08-01 MED ORDER — FENTANYL CITRATE (PF) 100 MCG/2ML IJ SOLN
INTRAMUSCULAR | Status: DC | PRN
  Administered 2016-08-01: 100 ug via INTRAVENOUS
  Administered 2016-08-01 (×2): 50 ug via INTRAVENOUS

## 2016-08-01 SURGICAL SUPPLY — 32 items
CANISTER SUCTION 2500CC (MISCELLANEOUS) ×2 IMPLANT
CATH BALLN 4FR (CATHETERS) ×1 IMPLANT
CATH EMB 4FR 80CM (CATHETERS) IMPLANT
CATH EMB 5FR 80CM (CATHETERS) ×1 IMPLANT
CATH EMB 6FR 80CM (CATHETERS) ×1 IMPLANT
CATH LOADER DEPLOYMENT HUD (CATHETERS) ×1 IMPLANT
CONT SPEC 4OZ CLIKSEAL STRL BL (MISCELLANEOUS) ×2 IMPLANT
COVER TABLE BACK 60X90 (DRAPES) ×2 IMPLANT
FILTER STRAW FLUID ASPIR (MISCELLANEOUS) IMPLANT
FORCEPS BIOP RJ4 1.8 (CUTTING FORCEPS) IMPLANT
GAUZE SPONGE 4X4 12PLY STRL (GAUZE/BANDAGES/DRESSINGS) ×2 IMPLANT
GLOVE BIO SURGEON STRL SZ 6.5 (GLOVE) ×2 IMPLANT
GOWN STRL REUS W/ TWL LRG LVL3 (GOWN DISPOSABLE) ×2 IMPLANT
GOWN STRL REUS W/TWL LRG LVL3 (GOWN DISPOSABLE) ×4
KIT AIRWAY SIZING HUD (KITS) ×1 IMPLANT
KIT CLEAN ENDO COMPLIANCE (KITS) ×2 IMPLANT
KIT ROOM TURNOVER OR (KITS) ×2 IMPLANT
MARKER SKIN DUAL TIP RULER LAB (MISCELLANEOUS) ×2 IMPLANT
NS IRRIG 1000ML POUR BTL (IV SOLUTION) ×2 IMPLANT
OIL SILICONE PENTAX (PARTS (SERVICE/REPAIRS)) ×1 IMPLANT
PAD ARMBOARD 7.5X6 YLW CONV (MISCELLANEOUS) ×4 IMPLANT
STOPCOCK MORSE 400PSI 3WAY (MISCELLANEOUS) ×2 IMPLANT
SYR 20ML ECCENTRIC (SYRINGE) ×2 IMPLANT
SYR 3ML LL SCALE MARK (SYRINGE) ×2 IMPLANT
SYR 5ML LL (SYRINGE) ×2 IMPLANT
SYRINGE 10CC LL (SYRINGE) ×2 IMPLANT
SYSTEM SAHARA CHEST DRAIN ATS (WOUND CARE) ×1 IMPLANT
TRAP SPECIMEN MUCOUS 40CC (MISCELLANEOUS) ×1 IMPLANT
TUBE CONNECTING 12X1/4 (SUCTIONS) ×1 IMPLANT
TUBE CONNECTING 20X1/4 (TUBING) ×3 IMPLANT
VALVE IN CARTRIDGE 7MM HUD (Valve) ×1 IMPLANT
VALVE IN CARTRIDGE 9MM HUD (Valve) ×2 IMPLANT

## 2016-08-01 NOTE — Transfer of Care (Signed)
Immediate Anesthesia Transfer of Care Note  Patient: [name]  Procedure(s) Performed: Procedure(s): VIDEO BRONCHOSCOPY WITH INSERTION OF INTERBRONCHIAL VALVE (IBV) (Right)  Patient Location: PACU  Anesthesia Type:General  Level of Consciousness: awake and alert   Airway & Oxygen Therapy: Patient Spontanous Breathing and Patient connected to face mask oxygen  Post-op Assessment: Report given to RN, Post -op Vital signs reviewed and stable and Patient moving all extremities X 4  Post vital signs: Reviewed and stable  Last Vitals:  Vitals:   08/01/16 1300 08/01/16 1645  BP:    Pulse: (!) 52   Resp: 12   Temp:  36.5 C    Last Pain:  Vitals:   08/01/16 1113  TempSrc: Oral  PainSc:       Patients Stated Pain Goal: 2 (64/33/29 5188)  Complications: No apparent anesthesia complications

## 2016-08-01 NOTE — Anesthesia Procedure Notes (Signed)
Procedure Name: Intubation Date/Time: 08/01/2016 3:22 PM Performed by: Rejeana Brock L Pre-anesthesia Checklist: Patient identified, Emergency Drugs available, Suction available and Patient being monitored Patient Re-evaluated:Patient Re-evaluated prior to inductionOxygen Delivery Method: Circle System Utilized Preoxygenation: Pre-oxygenation with 100% oxygen Intubation Type: IV induction Ventilation: Mask ventilation without difficulty Laryngoscope Size: Mac and 4 Grade View: Grade I Tube type: Oral Tube size: 8.5 mm Number of attempts: 1 Airway Equipment and Method: Stylet and Oral airway Placement Confirmation: ETT inserted through vocal cords under direct vision,  positive ETCO2 and breath sounds checked- equal and bilateral Secured at: 23 cm Tube secured with: Tape Dental Injury: Teeth and Oropharynx as per pre-operative assessment

## 2016-08-01 NOTE — H&P (View-Only) (Signed)
Patient ID: [name], male   DOB: 29-Mar-1951, 66 y.o.   MRN: [id number] EVENING ROUNDS NOTE :     [address]       [place] [address]             [phone]                 12 Days Post-Op Procedure(s) (LRB): VIDEO BRONCHOSCOPY (N/A) VIDEO ASSISTED THORACOSCOPY (VATS) (Right) WEDGE  RESECTION RIGHT UPPER LOBE (Right) LOBECTOMY RIGHT UPPER LOBE WITH WEDGE RESECTION SUPERIOR SEGMENT RIGHT LOWER LOBE WITH PLACEMENT OF ON-Q (Right) LYMPH NODE DISSECTION (Right)  Total Length of Stay:  LOS: 12 days  BP (!) 114/56 (BP Location: Left Arm)   Pulse 63   Temp 97.5 F (36.4 C) (Oral)   Resp 15   Ht 6' (1.829 m)   Wt 147 lb 11.3 oz (67 kg)   SpO2 97%   BMI 20.03 kg/m   .Intake/Output      01/27 0701 - 01/28 0700 01/28 0701 - 01/29 0700   P.O. 1320    Total Intake(mL/kg) 1320 (19.7)    Urine (mL/kg/hr) 1070 (0.7) 250 (0.4)   Chest Tube 50 (0) 50 (0.1)   Total Output 1120 300   Net +200 -300          . dextrose 5 % and 0.45% NaCl Stopped (07/25/16 1100)     Lab Results  Component Value Date   WBC 10.5 07/29/2016   HGB 12.0 (L) 07/29/2016   HCT 36.5 (L) 07/29/2016   PLT 334 07/29/2016   GLUCOSE 104 (H) 07/29/2016   CHOL 243 (H) 07/11/2016   TRIG 102 07/11/2016   HDL 75 07/11/2016   LDLCALC 148 (H) 07/11/2016   ALT 17 07/21/2016   AST 30 07/21/2016   NA 131 (L) 07/29/2016   K 4.2 07/29/2016   CL 93 (L) 07/29/2016   CREATININE 0.65 07/29/2016   BUN 14 07/29/2016   CO2 28 07/29/2016   INR 0.94 07/15/2016   Stable day  [name] MD  Beeper [phone] Office [phone] 07/31/2016 4:43 PM

## 2016-08-01 NOTE — Interval H&P Note (Signed)
Progress preop  Interval Note:  08/01/2016 2:50 PM  Kelby Fam  has presented today for surgery, with the diagnosis of AIR LEAK  The various methods of treatment have been discussed with the patient and family. After consideration of risks, benefits and other options for treatment, the patient has consented to  Procedure(s): VIDEO BRONCHOSCOPY WITH INSERTION OF INTERBRONCHIAL VALVE (IBV) (N/A) as a surgical intervention .  The patient's history has been reviewed, patient examined, no change in status, stable for surgery.  I have reviewed the patient's chart and labs.  Questions were answered to the patient's satisfaction.    Lab Results  Component Value Date   WBC 10.6 (H) 08/01/2016   HGB 11.5 (L) 08/01/2016   HCT 35.4 (L) 08/01/2016   PLT 361 08/01/2016   GLUCOSE 101 (H) 08/01/2016   CHOL 243 (H) 07/11/2016   TRIG 102 07/11/2016   HDL 75 07/11/2016   LDLCALC 148 (H) 07/11/2016   ALT 17 07/21/2016   AST 30 07/21/2016   NA 135 08/01/2016   K 4.5 08/01/2016   CL 100 (L) 08/01/2016   CREATININE 0.79 08/01/2016   BUN 17 08/01/2016   CO2 28 08/01/2016   INR 0.94 07/15/2016   [name]

## 2016-08-01 NOTE — Anesthesia Postprocedure Evaluation (Signed)
Anesthesia Post Note  Patient: [name]  Procedure(s) Performed: Procedure(s) (LRB): VIDEO BRONCHOSCOPY WITH INSERTION OF INTERBRONCHIAL VALVE (IBV) (Right)  Patient location during evaluation: PACU Anesthesia Type: General Level of consciousness: awake and alert Pain management: pain level controlled Vital Signs Assessment: post-procedure vital signs reviewed and stable Respiratory status: spontaneous breathing, nonlabored ventilation, respiratory function stable and patient connected to nasal cannula oxygen Cardiovascular status: blood pressure returned to baseline and stable Postop Assessment: no signs of nausea or vomiting Anesthetic complications: no       Last Vitals:  Vitals:   08/01/16 1646 08/01/16 1700  BP: 135/78 123/66  Pulse: 90 92  Resp: (!) 27 16  Temp:      Last Pain:  Vitals:   08/01/16 1645  TempSrc:   PainSc: 0-No pain                 Jarreau Callanan DAVID

## 2016-08-01 NOTE — Plan of Care (Signed)
Problem: Tissue Perfusion: Goal: Risk factors for ineffective tissue perfusion will decrease Outcome: Completed/Met Date Met: 08/01/16 Pt able to ambulate 300' 3 times per day, OOB to chair for meals and wears SCD's while in bed for VTE prevention.

## 2016-08-01 NOTE — Anesthesia Preprocedure Evaluation (Signed)
Anesthesia Evaluation  Patient identified by MRN, date of birth, ID band Patient awake    Reviewed: Allergy & Precautions, NPO status , Patient's Chart, lab work & pertinent test results  Airway Mallampati: I  TM Distance: >3 FB Neck ROM: Full    Dental   Pulmonary COPD, Current Smoker,    Pulmonary exam normal        Cardiovascular Normal cardiovascular exam     Neuro/Psych    GI/Hepatic   Endo/Other    Renal/GU      Musculoskeletal   Abdominal   Peds  Hematology   Anesthesia Other Findings   Reproductive/Obstetrics                             Anesthesia Physical Anesthesia Plan  ASA: III  Anesthesia Plan: General   Post-op Pain Management:    Induction: Intravenous  Airway Management Planned: Oral ETT  Additional Equipment:   Intra-op Plan:   Post-operative Plan: Extubation in OR  Informed Consent: I have reviewed the patients History and Physical, chart, labs and discussed the procedure including the risks, benefits and alternatives for the proposed anesthesia with the patient or authorized representative who has indicated his/her understanding and acceptance.     Plan Discussed with: CRNA and Surgeon  Anesthesia Plan Comments:         Anesthesia Quick Evaluation

## 2016-08-01 NOTE — Brief Op Note (Signed)
      [address]       [place] [address]             [phone]      08/01/2016  4:32 PM  PATIENT:  Kelby Fam  66 y.o. 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  POST-OPERATIVE DIAGNOSIS:  Same   PROCEDURE:  Procedure(s): VIDEO BRONCHOSCOPY WITH INSERTION OF INTERBRONCHIAL VALVE (IBV) (N/A) Insertion of IBV valves  Superior segments right ll, medial and laterial segments of middle lobe   SURGEON:  Surgeon(s) and Role:    * [name], MD - Primary    ANESTHESIA:   general  EBL:  Total I/O In: 1000 [I.V.:1000] Out: 150 [Urine:150]  BLOOD ADMINISTERED:none  DRAINS: none   LOCAL MEDICATIONS USED:  NONE  SPECIMEN:  No Specimen  DISPOSITION OF SPECIMEN:  N/A  COUNTS:  YES   DICTATION: .Dragon Dictation  PLAN OF CARE: inpatient  PATIENT DISPOSITION:  PACU - hemodynamically stable.   Delay start of Pharmacological VTE agent (>24hrs) due to surgical blood loss or risk of bleeding: yes

## 2016-08-01 NOTE — Progress Notes (Signed)
CT surgery p.m. Rounds  Patient  recent return from OR after bronchial valve placement Airleak appears improved Patient awake and breathing comfortably O2 saturation 95% on 2 L O2

## 2016-08-02 ENCOUNTER — Encounter (HOSPITAL_COMMUNITY): Payer: Self-pay | Admitting: Cardiothoracic Surgery

## 2016-08-02 MED ORDER — KETOROLAC TROMETHAMINE 15 MG/ML IJ SOLN
15.0000 mg | Freq: Once | INTRAMUSCULAR | Status: AC
  Administered 2016-08-02: 15 mg via INTRAVENOUS
  Filled 2016-08-02: qty 1

## 2016-08-02 NOTE — Progress Notes (Signed)
Patient ID: [name], male   DOB: 1950-12-13, 66 y.o.   MRN: [id number] TCTS DAILY ICU PROGRESS NOTE                   [address]            [place] [address]          [phone]   1 Day Post-Op Procedure(s) (LRB): VIDEO BRONCHOSCOPY WITH INSERTION OF INTERBRONCHIAL VALVE (IBV) (Right)                     13 Days Post-Op Procedure(s) (LRB): VIDEO BRONCHOSCOPY (N/A) VIDEO ASSISTED THORACOSCOPY (VATS) (Right) WEDGE  RESECTION RIGHT UPPER LOBE (Right) LOBECTOMY RIGHT UPPER LOBE WITH WEDGE RESECTION SUPERIOR SEGMENT RIGHT LOWER LOBE WITH PLACEMENT OF ON-Q (Right) LYMPH NODE DISSECTION (Right Total Length of Stay:  LOS: 14 days   Subjective: Feels better, walking well  Objective: Vital signs in last 24 hours: Temp:  [97.4 F (36.3 C)-98.4 F (36.9 C)] 97.8 F (36.6 C) (01/30 0400) Pulse Rate:  [52-94] 67 (01/30 0600) Cardiac Rhythm: Normal sinus rhythm (01/29 2000) Resp:  [7-27] 13 (01/30 0600) BP: (77-135)/(49-78) 106/65 (01/30 0600) SpO2:  [91 %-99 %] 91 % (01/30 0600) FiO2 (%):  [28 %] 28 % (01/29 0853)  Filed Weights   07/26/16 0600 07/29/16 0500 07/31/16 0600  Weight: 152 lb 8.9 oz (69.2 kg) 148 lb 13 oz (67.5 kg) 147 lb 11.3 oz (67 kg)    Weight change:    Hemodynamic parameters for last 24 hours:    Intake/Output from previous day: 01/29 0701 - 01/30 0700 In: 1920 [P.O.:720; I.V.:1200] Out: 2481 [Urine:2450; Blood:1; Chest Tube:30]  Intake/Output this shift: No intake/output data recorded.  Current Meds: Scheduled Meds: . bisacodyl  10 mg Oral Daily  . enoxaparin (LOVENOX) injection  40 mg Subcutaneous Q24H  . mometasone-formoterol  2 puff Inhalation BID  . senna-docusate  1 tablet Oral QHS  . tamsulosin  0.4 mg Oral QPC breakfast  . tiotropium  1 capsule Inhalation Daily   Continuous Infusions: . dextrose 5 % and 0.45% NaCl Stopped (07/25/16 1100)  . lactated ringers 10 mL/hr at 08/02/16 0600   PRN Meds:.albuterol,  ketorolac, oxyCODONE, potassium chloride (KCL MULTIRUN) 30 mEq in 265 mL IVPB, sodium chloride, sodium chloride flush, traMADol  General appearance: alert and cooperative Neurologic: intact Heart: regular rate and rhythm, S1, S2 normal, no murmur, click, rub or gallop Lungs: diminished breath sounds RLL Abdomen: soft, non-tender; bowel sounds normal; no masses,  no organomegaly Extremities: extremities normal, atraumatic, no cyanosis or edema and Homans sign is negative, no sign of DVT Wound: still with air leak, decreased some   Lab Results: CBC: Recent Labs  08/01/16 0853  WBC 10.6*  HGB 11.5*  HCT 35.4*  PLT 361   BMET:  Recent Labs  08/01/16 0853  NA 135  K 4.5  CL 100*  CO2 28  GLUCOSE 101*  BUN 17  CREATININE 0.79  CALCIUM 8.9    CMET: Lab Results  Component Value Date   WBC 10.6 (H) 08/01/2016   HGB 11.5 (L) 08/01/2016   HCT 35.4 (L) 08/01/2016   PLT 361 08/01/2016   GLUCOSE 101 (H) 08/01/2016   CHOL 243 (H) 07/11/2016   TRIG 102 07/11/2016   HDL 75 07/11/2016   LDLCALC 148 (H) 07/11/2016   ALT 17 07/21/2016   AST 30 07/21/2016   NA 135 08/01/2016   K 4.5 08/01/2016  CL 100 (L) 08/01/2016   CREATININE 0.79 08/01/2016   BUN 17 08/01/2016   CO2 28 08/01/2016   INR 0.94 07/15/2016      PT/INR: No results for input(s): LABPROT, INR in the last 72 hours. Radiology: No results found.   Assessment/Plan: S/P Procedure(s) (LRB): VIDEO BRONCHOSCOPY WITH INSERTION OF INTERBRONCHIAL VALVE (IBV) (Right) Some improvement in leak but still present with cough, leave off suction , may need repeat vats      [name] 08/02/2016 7:36 AM

## 2016-08-03 ENCOUNTER — Inpatient Hospital Stay (HOSPITAL_COMMUNITY): Payer: Managed Care, Other (non HMO)

## 2016-08-03 LAB — BASIC METABOLIC PANEL
Anion gap: 7 (ref 5–15)
BUN: 14 mg/dL (ref 6–20)
CO2: 25 mmol/L (ref 22–32)
Calcium: 8.6 mg/dL — ABNORMAL LOW (ref 8.9–10.3)
Chloride: 103 mmol/L (ref 101–111)
Creatinine, Ser: 0.68 mg/dL (ref 0.61–1.24)
GFR calc Af Amer: >60 mL/min (ref >60)
GFR calc non Af Amer: >60 mL/min (ref >60)
Glucose, Bld: 101 mg/dL — ABNORMAL HIGH (ref 65–99)
Potassium: 4.2 mmol/L (ref 3.5–5.1)
Sodium: 135 mmol/L (ref 135–145)

## 2016-08-03 LAB — CBC
HCT: 33.8 % — ABNORMAL LOW (ref 39.0–52.0)
Hemoglobin: 11.1 g/dL — ABNORMAL LOW (ref 13.0–17.0)
MCH: 30 pg (ref 26.0–34.0)
MCHC: 32.8 g/dL (ref 30.0–36.0)
MCV: 91.4 fL (ref 78.0–100.0)
Platelets: 349 10*3/uL (ref 150–400)
RBC: 3.7 MIL/uL — ABNORMAL LOW (ref 4.22–5.81)
RDW: 13.6 % (ref 11.5–15.5)
WBC: 8.7 10*3/uL (ref 4.0–10.5)

## 2016-08-03 NOTE — Anesthesia Preprocedure Evaluation (Addendum)
Anesthesia Evaluation  Patient identified by MRN, date of birth, ID band Patient awake    Reviewed: Allergy & Precautions, NPO status , Patient's Chart, lab work & pertinent test results  Airway Mallampati: I  TM Distance: >3 FB Neck ROM: Full    Dental  (+) Edentulous Upper, Edentulous Lower   Pulmonary shortness of breath, COPD, Current Smoker,     + decreased breath sounds(-) wheezing      Cardiovascular negative cardio ROS Normal cardiovascular exam Rhythm:Regular     Neuro/Psych negative neurological ROS  negative psych ROS   GI/Hepatic negative GI ROS, Neg liver ROS,   Endo/Other  negative endocrine ROS  Renal/GU negative Renal ROS     Musculoskeletal  (+) Arthritis ,   Abdominal   Peds  Hematology negative hematology ROS (+)   Anesthesia Other Findings   Reproductive/Obstetrics                            Anesthesia Physical  Anesthesia Plan  ASA: III  Anesthesia Plan: General   Post-op Pain Management:    Induction: Intravenous  Airway Management Planned: Double Lumen EBT  Additional Equipment: Arterial line  Intra-op Plan:   Post-operative Plan: Extubation in OR  Informed Consent: I have reviewed the patients History and Physical, chart, labs and discussed the procedure including the risks, benefits and alternatives for the proposed anesthesia with the patient or authorized representative who has indicated his/her understanding and acceptance.   Dental advisory given  Plan Discussed with: CRNA  Anesthesia Plan Comments:       Anesthesia Quick Evaluation                                   Anesthesia Evaluation  Patient identified by MRN, date of birth, ID band Patient awake    Reviewed: Allergy & Precautions, NPO status , Patient's Chart, lab work & pertinent test results  Airway Mallampati: II  TM Distance: >3 FB Neck ROM: Full    Dental  (+)  Edentulous Upper, Edentulous Lower   Pulmonary Current Smoker,     + decreased breath sounds      Cardiovascular  Rhythm:Regular Rate:Normal     Neuro/Psych    GI/Hepatic   Endo/Other    Renal/GU      Musculoskeletal   Abdominal   Peds  Hematology   Anesthesia Other Findings   Reproductive/Obstetrics                             Anesthesia Physical Anesthesia Plan  ASA: III  Anesthesia Plan: General   Post-op Pain Management:    Induction: Intravenous  Airway Management Planned: Double Lumen EBT  Additional Equipment: Arterial line and CVP  Intra-op Plan:   Post-operative Plan: Extubation in OR  Informed Consent: I have reviewed the patients History and Physical, chart, labs and discussed the procedure including the risks, benefits and alternatives for the proposed anesthesia with the patient or authorized representative who has indicated his/her understanding and acceptance.     Plan Discussed with: CRNA and Anesthesiologist  Anesthesia Plan Comments:         Anesthesia Quick Evaluation

## 2016-08-03 NOTE — Progress Notes (Signed)
Patient ID: [name], male   DOB: Feb 16, 1951, 66 y.o.   MRN: [id number] TCTS DAILY ICU PROGRESS NOTE                   [address]            [place] [address]          [phone]   2 Days Post-Op Procedure(s) (LRB): VIDEO BRONCHOSCOPY WITH INSERTION OF INTERBRONCHIAL VALVE (IBV) (Right)  Total Length of Stay:  LOS: 15 days   Subjective: Feels ok this am  Objective: Vital signs in last 24 hours: Temp:  [97.5 F (36.4 C)-98.4 F (36.9 C)] 97.9 F (36.6 C) (01/31 0400) Pulse Rate:  [51-84] 60 (01/31 0700) Cardiac Rhythm: Normal sinus rhythm (01/31 0000) Resp:  [11-29] 18 (01/31 0700) BP: (95-149)/(57-84) 128/68 (01/31 0600) SpO2:  [90 %-97 %] 97 % (01/31 0700)  Filed Weights   07/26/16 0600 07/29/16 0500 07/31/16 0600  Weight: 152 lb 8.9 oz (69.2 kg) 148 lb 13 oz (67.5 kg) 147 lb 11.3 oz (67 kg)    Weight change:    Hemodynamic parameters for last 24 hours:    Intake/Output from previous day: 01/30 0701 - 01/31 0700 In: 480 [P.O.:480] Out: 1550 [Urine:1550]  Intake/Output this shift: No intake/output data recorded.  Current Meds: Scheduled Meds: . bisacodyl  10 mg Oral Daily  . enoxaparin (LOVENOX) injection  40 mg Subcutaneous Q24H  . mometasone-formoterol  2 puff Inhalation BID  . senna-docusate  1 tablet Oral QHS  . tamsulosin  0.4 mg Oral QPC breakfast  . tiotropium  1 capsule Inhalation Daily   Continuous Infusions: . dextrose 5 % and 0.45% NaCl Stopped (07/25/16 1100)  . lactated ringers Stopped (08/02/16 0700)   PRN Meds:.albuterol, oxyCODONE, potassium chloride (KCL MULTIRUN) 30 mEq in 265 mL IVPB, sodium chloride, sodium chloride flush, traMADol  General appearance: alert and cooperative Neurologic: intact Heart: regular rate and rhythm, S1, S2 normal, no murmur, click, rub or gallop Lungs: diminished breath sounds bibasilar Abdomen: soft, non-tender; bowel sounds normal; no masses,  no organomegaly Extremities:  extremities normal, atraumatic, no cyanosis or edema and Homans sign is negative, no sign of DVT Wound: persistent air leak with cough  Lab Results: CBC: Recent Labs  08/01/16 0853 08/03/16 0409  WBC 10.6* 8.7  HGB 11.5* 11.1*  HCT 35.4* 33.8*  PLT 361 349   BMET:  Recent Labs  08/01/16 0853 08/03/16 0409  NA 135 135  K 4.5 4.2  CL 100* 103  CO2 28 25  GLUCOSE 101* 101*  BUN 17 14  CREATININE 0.79 0.68  CALCIUM 8.9 8.6*    CMET: Lab Results  Component Value Date   WBC 8.7 08/03/2016   HGB 11.1 (L) 08/03/2016   HCT 33.8 (L) 08/03/2016   PLT 349 08/03/2016   GLUCOSE 101 (H) 08/03/2016   CHOL 243 (H) 07/11/2016   TRIG 102 07/11/2016   HDL 75 07/11/2016   LDLCALC 148 (H) 07/11/2016   ALT 17 07/21/2016   AST 30 07/21/2016   NA 135 08/03/2016   K 4.2 08/03/2016   CL 103 08/03/2016   CREATININE 0.68 08/03/2016   BUN 14 08/03/2016   CO2 25 08/03/2016   INR 0.94 07/15/2016      PT/INR: No results for input(s): LABPROT, INR in the last 72 hours. Radiology: No results found.   Assessment/Plan: S/P Procedure(s) (LRB): VIDEO BRONCHOSCOPY WITH INSERTION OF INTERBRONCHIAL VALVE (IBV) (Right) Mobilize With persistent  air leak , large with cough I have recommended to patient proceeding with right vats to find and repair air leak , plan in am Risks and options discussed , patient agreeable      [name] 08/03/2016 7:38 AM

## 2016-08-03 NOTE — Progress Notes (Signed)
Patient ID: [name], male   DOB: 1951/05/05, 66 y.o.   MRN: [id number]  SICU Evening Rounds:  Hemodynamically stable in sinus rhythm. Sats 97%  Ambulating well tonight.  For return to OR tomorrow for persistent air leak.

## 2016-08-04 ENCOUNTER — Encounter (HOSPITAL_COMMUNITY): Payer: Self-pay | Admitting: Anesthesiology

## 2016-08-04 ENCOUNTER — Inpatient Hospital Stay (HOSPITAL_COMMUNITY): Payer: Managed Care, Other (non HMO) | Admitting: Certified Registered"

## 2016-08-04 ENCOUNTER — Inpatient Hospital Stay (HOSPITAL_COMMUNITY): Payer: Managed Care, Other (non HMO)

## 2016-08-04 ENCOUNTER — Encounter (HOSPITAL_COMMUNITY)
Admission: RE | Disposition: A | Discharge status: Home Health | Payer: Self-pay | Source: Ambulatory Visit | Attending: Cardiothoracic Surgery

## 2016-08-04 HISTORY — PX: VIDEO ASSISTED THORACOSCOPY: SHX5073

## 2016-08-04 LAB — COMPREHENSIVE METABOLIC PANEL
ALT: 20 U/L (ref 17–63)
AST: 17 U/L (ref 15–41)
Albumin: 2.7 g/dL — ABNORMAL LOW (ref 3.5–5.0)
Alkaline Phosphatase: 85 U/L (ref 38–126)
Anion gap: 9 (ref 5–15)
BUN: 10 mg/dL (ref 6–20)
CO2: 26 mmol/L (ref 22–32)
Calcium: 8.9 mg/dL (ref 8.9–10.3)
Chloride: 100 mmol/L — ABNORMAL LOW (ref 101–111)
Creatinine, Ser: 0.71 mg/dL (ref 0.61–1.24)
GFR calc Af Amer: >60 mL/min (ref >60)
GFR calc non Af Amer: >60 mL/min (ref >60)
Glucose, Bld: 113 mg/dL — ABNORMAL HIGH (ref 65–99)
Potassium: 4.3 mmol/L (ref 3.5–5.1)
Sodium: 135 mmol/L (ref 135–145)
Total Bilirubin: 0.3 mg/dL (ref 0.3–1.2)
Total Protein: 5.7 g/dL — ABNORMAL LOW (ref 6.5–8.1)

## 2016-08-04 LAB — BLOOD GAS, ARTERIAL
Acid-Base Excess: 4.1 mmol/L — ABNORMAL HIGH (ref 0.0–2.0)
Bicarbonate: 28 mmol/L (ref 20.0–28.0)
Drawn by: 41977
FIO2: 21
O2 Saturation: 89.3 %
Patient temperature: 98.6
pCO2 arterial: 41.2 mmHg (ref 32.0–48.0)
pH, Arterial: 7.447 (ref 7.350–7.450)
pO2, Arterial: 57.9 mmHg — ABNORMAL LOW (ref 83.0–108.0)

## 2016-08-04 LAB — GLUCOSE, CAPILLARY
Glucose-Capillary: 113 mg/dL — ABNORMAL HIGH (ref 65–99)
Glucose-Capillary: 128 mg/dL — ABNORMAL HIGH (ref 65–99)
Glucose-Capillary: 134 mg/dL — ABNORMAL HIGH (ref 65–99)

## 2016-08-04 LAB — CBC
HCT: 34.6 % — ABNORMAL LOW (ref 39.0–52.0)
Hemoglobin: 11.4 g/dL — ABNORMAL LOW (ref 13.0–17.0)
MCH: 30.1 pg (ref 26.0–34.0)
MCHC: 32.9 g/dL (ref 30.0–36.0)
MCV: 91.3 fL (ref 78.0–100.0)
Platelets: 369 10*3/uL (ref 150–400)
RBC: 3.79 MIL/uL — ABNORMAL LOW (ref 4.22–5.81)
RDW: 13.6 % (ref 11.5–15.5)
WBC: 9.3 10*3/uL (ref 4.0–10.5)

## 2016-08-04 LAB — URINALYSIS, ROUTINE W REFLEX MICROSCOPIC
Bilirubin Urine: NEGATIVE
Glucose, UA: NEGATIVE mg/dL
Hgb urine dipstick: NEGATIVE
Ketones, ur: NEGATIVE mg/dL
Leukocytes, UA: NEGATIVE
Nitrite: NEGATIVE
Protein, ur: NEGATIVE mg/dL
Specific Gravity, Urine: 1.016 (ref 1.005–1.030)
pH: 7 (ref 5.0–8.0)

## 2016-08-04 LAB — APTT: aPTT: 41 seconds — ABNORMAL HIGH (ref 24–36)

## 2016-08-04 LAB — PROTIME-INR
INR: 1.06
Prothrombin Time: 13.8 seconds (ref 11.4–15.2)

## 2016-08-04 LAB — TYPE AND SCREEN
ABO/RH(D): O POS
Antibody Screen: NEGATIVE

## 2016-08-04 SURGERY — VIDEO ASSISTED THORACOSCOPY
Anesthesia: General | Site: Chest | Laterality: Right

## 2016-08-04 MED ORDER — MIDAZOLAM HCL 2 MG/2ML IJ SOLN
INTRAMUSCULAR | Status: AC
Start: 2016-08-04 — End: 2016-08-04
  Filled 2016-08-04: qty 2

## 2016-08-04 MED ORDER — DEXTROSE 5 % IV SOLN
1.5000 g | INTRAVENOUS | Status: AC
  Administered 2016-08-04: 1.5 g via INTRAVENOUS
  Filled 2016-08-04 (×2): qty 1.5

## 2016-08-04 MED ORDER — EPHEDRINE SULFATE-NACL 50-0.9 MG/10ML-% IV SOSY
PREFILLED_SYRINGE | INTRAVENOUS | Status: DC | PRN
  Administered 2016-08-04: 5 mg via INTRAVENOUS
  Administered 2016-08-04 (×2): 10 mg via INTRAVENOUS

## 2016-08-04 MED ORDER — PROPOFOL 10 MG/ML IV BOLUS
INTRAVENOUS | Status: DC | PRN
Start: 2016-08-04 — End: 2016-08-04
  Administered 2016-08-04: 30 mg via INTRAVENOUS
  Administered 2016-08-04: 120 mg via INTRAVENOUS

## 2016-08-04 MED ORDER — LIDOCAINE HCL (CARDIAC) 20 MG/ML IV SOLN
INTRAVENOUS | Status: DC | PRN
  Administered 2016-08-04: 100 mg via INTRAVENOUS

## 2016-08-04 MED ORDER — SODIUM CHLORIDE 0.9 % IV SOLN
30.0000 meq | Freq: Every day | INTRAVENOUS | Status: DC | PRN
  Filled 2016-08-04: qty 15

## 2016-08-04 MED ORDER — HYDROMORPHONE HCL 1 MG/ML IJ SOLN
0.2500 mg | INTRAMUSCULAR | Status: DC | PRN
  Administered 2016-08-04 (×2): 0.5 mg via INTRAVENOUS

## 2016-08-04 MED ORDER — SENNOSIDES-DOCUSATE SODIUM 8.6-50 MG PO TABS
1.0000 | ORAL_TABLET | Freq: Every day | ORAL | Status: DC
  Administered 2016-08-04: 1 via ORAL
  Filled 2016-08-04: qty 1

## 2016-08-04 MED ORDER — MIDAZOLAM HCL 5 MG/5ML IJ SOLN
INTRAMUSCULAR | Status: DC | PRN
Start: 2016-08-04 — End: 2016-08-04
  Administered 2016-08-04 (×2): 1 mg via INTRAVENOUS

## 2016-08-04 MED ORDER — FENTANYL 40 MCG/ML IV SOLN
INTRAVENOUS | Status: AC
  Filled 2016-08-04: qty 25

## 2016-08-04 MED ORDER — ROCURONIUM BROMIDE 100 MG/10ML IV SOLN
INTRAVENOUS | Status: DC | PRN
  Administered 2016-08-04: 20 mg via INTRAVENOUS
  Administered 2016-08-04: 50 mg via INTRAVENOUS
  Administered 2016-08-04: 10 mg via INTRAVENOUS

## 2016-08-04 MED ORDER — BISACODYL 5 MG PO TBEC
10.0000 mg | DELAYED_RELEASE_TABLET | Freq: Every day | ORAL | Status: DC
  Administered 2016-08-05 – 2016-08-14 (×4): 10 mg via ORAL
  Filled 2016-08-04 (×9): qty 2

## 2016-08-04 MED ORDER — DEXTROSE 5 % IV SOLN
INTRAVENOUS | Status: DC | PRN
  Administered 2016-08-04: 50 ug/min via INTRAVENOUS

## 2016-08-04 MED ORDER — SODIUM CHLORIDE 0.9% FLUSH: 9.0000 mL | INTRAVENOUS | Status: DC | PRN

## 2016-08-04 MED ORDER — MOMETASONE FURO-FORMOTEROL FUM 200-5 MCG/ACT IN AERO
2.0000 | INHALATION_SPRAY | Freq: Two times a day (BID) | RESPIRATORY_TRACT | Status: DC
  Administered 2016-08-05 – 2016-08-15 (×20): 2 via RESPIRATORY_TRACT
  Filled 2016-08-04: qty 8.8

## 2016-08-04 MED ORDER — FENTANYL CITRATE (PF) 100 MCG/2ML IJ SOLN
INTRAMUSCULAR | Status: AC
  Filled 2016-08-04: qty 2

## 2016-08-04 MED ORDER — ONDANSETRON HCL 4 MG/2ML IJ SOLN
INTRAMUSCULAR | Status: DC | PRN
  Administered 2016-08-04: 4 mg via INTRAVENOUS

## 2016-08-04 MED ORDER — TIOTROPIUM BROMIDE MONOHYDRATE 18 MCG IN CAPS
1.0000 | ORAL_CAPSULE | Freq: Every day | RESPIRATORY_TRACT | Status: DC
  Administered 2016-08-05 – 2016-08-15 (×10): 18 ug via RESPIRATORY_TRACT
  Filled 2016-08-04 (×2): qty 5

## 2016-08-04 MED ORDER — DIPHENHYDRAMINE HCL 50 MG/ML IJ SOLN: 12.5000 mg | Freq: Four times a day (QID) | INTRAMUSCULAR | Status: DC | PRN

## 2016-08-04 MED ORDER — MEPERIDINE HCL 25 MG/ML IJ SOLN: 6.2500 mg | INTRAMUSCULAR | Status: DC | PRN

## 2016-08-04 MED ORDER — TAMSULOSIN HCL 0.4 MG PO CAPS
0.4000 mg | ORAL_CAPSULE | Freq: Every day | ORAL | Status: DC
  Administered 2016-08-05 – 2016-08-15 (×11): 0.4 mg via ORAL
  Filled 2016-08-04 (×11): qty 1

## 2016-08-04 MED ORDER — DEXTROSE 5 % IV SOLN
1.5000 g | Freq: Two times a day (BID) | INTRAVENOUS | Status: AC
  Administered 2016-08-04 – 2016-08-05 (×2): 1.5 g via INTRAVENOUS
  Filled 2016-08-04 (×2): qty 1.5

## 2016-08-04 MED ORDER — PROPOFOL 10 MG/ML IV BOLUS
INTRAVENOUS | Status: AC
Start: 2016-08-04 — End: 2016-08-04
  Filled 2016-08-04: qty 20

## 2016-08-04 MED ORDER — ROCURONIUM BROMIDE 50 MG/5ML IV SOSY
PREFILLED_SYRINGE | INTRAVENOUS | Status: AC
  Filled 2016-08-04: qty 5

## 2016-08-04 MED ORDER — ACETAMINOPHEN 160 MG/5ML PO SOLN: 1000.0000 mg | Freq: Four times a day (QID) | ORAL | Status: DC

## 2016-08-04 MED ORDER — DEXTROSE-NACL 5-0.45 % IV SOLN
INTRAVENOUS | Status: DC
  Administered 2016-08-04 (×2): via INTRAVENOUS

## 2016-08-04 MED ORDER — SODIUM CHLORIDE 0.9 % IR SOLN
Status: DC | PRN
  Administered 2016-08-04: 2000 mL

## 2016-08-04 MED ORDER — FENTANYL CITRATE (PF) 100 MCG/2ML IJ SOLN
INTRAMUSCULAR | Status: DC | PRN
  Administered 2016-08-04 (×6): 50 ug via INTRAVENOUS

## 2016-08-04 MED ORDER — NALOXONE HCL 0.4 MG/ML IJ SOLN: 0.4000 mg | INTRAMUSCULAR | Status: DC | PRN

## 2016-08-04 MED ORDER — OXYCODONE HCL 5 MG PO TABS
5.0000 mg | ORAL_TABLET | ORAL | Status: DC | PRN
Start: 2016-08-04 — End: 2016-08-12
  Administered 2016-08-04 – 2016-08-05 (×2): 5 mg via ORAL
  Administered 2016-08-05 – 2016-08-06 (×3): 10 mg via ORAL
  Administered 2016-08-07: 5 mg via ORAL
  Administered 2016-08-07 (×2): 10 mg via ORAL
  Administered 2016-08-07: 5 mg via ORAL
  Administered 2016-08-08 – 2016-08-12 (×13): 10 mg via ORAL
  Filled 2016-08-04 (×2): qty 2
  Filled 2016-08-04: qty 1
  Filled 2016-08-04 (×9): qty 2
  Filled 2016-08-04: qty 1
  Filled 2016-08-04 (×8): qty 2

## 2016-08-04 MED ORDER — FENTANYL 40 MCG/ML IV SOLN
INTRAVENOUS | Status: DC
  Administered 2016-08-04: 113 ug via INTRAVENOUS
  Administered 2016-08-04: 12:00:00 via INTRAVENOUS
  Administered 2016-08-05: 240 ug via INTRAVENOUS
  Administered 2016-08-05: 105 ug via INTRAVENOUS
  Administered 2016-08-05: 1000 ug via INTRAVENOUS
  Administered 2016-08-05: 165 ug via INTRAVENOUS
  Administered 2016-08-05: 120 ug via INTRAVENOUS
  Administered 2016-08-05 (×2): 135 ug via INTRAVENOUS
  Administered 2016-08-06 (×2): 150 ug via INTRAVENOUS
  Administered 2016-08-06: 90 ug via INTRAVENOUS
  Administered 2016-08-06: 150 ug via INTRAVENOUS
  Administered 2016-08-06: 90 ug via INTRAVENOUS
  Administered 2016-08-06: 150 ug via INTRAVENOUS
  Administered 2016-08-07: 30 ug via INTRAVENOUS
  Administered 2016-08-07: 105 ug via INTRAVENOUS
  Administered 2016-08-07: 1000 ug via INTRAVENOUS
  Administered 2016-08-07: 30 ug via INTRAVENOUS
  Administered 2016-08-07: 75 ug via INTRAVENOUS
  Administered 2016-08-07: 30 ug via INTRAVENOUS
  Administered 2016-08-07: 120 ug via INTRAVENOUS
  Administered 2016-08-07: 90 ug via INTRAVENOUS
  Administered 2016-08-07: 180 ug via INTRAVENOUS
  Administered 2016-08-07: 90 ug via INTRAVENOUS
  Administered 2016-08-08: 180 ug via INTRAVENOUS
  Administered 2016-08-08: 60 ug via INTRAVENOUS
  Administered 2016-08-08: 90 ug via INTRAVENOUS
  Administered 2016-08-08: 195 ug via INTRAVENOUS
  Administered 2016-08-08: 135 ug via INTRAVENOUS
  Administered 2016-08-08: 1000 ug via INTRAVENOUS
  Administered 2016-08-09: 135 ug via INTRAVENOUS
  Administered 2016-08-09: 195 ug via INTRAVENOUS
  Administered 2016-08-09: 2 ug via INTRAVENOUS
  Administered 2016-08-09: 4 ug via INTRAVENOUS
  Administered 2016-08-09: 120 ug via INTRAVENOUS
  Administered 2016-08-10: 1 ug via INTRAVENOUS
  Administered 2016-08-10: 60 ug via INTRAVENOUS
  Administered 2016-08-10: 1 ug via INTRAVENOUS
  Filled 2016-08-04 (×5): qty 25

## 2016-08-04 MED ORDER — TRAMADOL HCL 50 MG PO TABS
50.0000 mg | ORAL_TABLET | Freq: Four times a day (QID) | ORAL | Status: DC | PRN
Start: 2016-08-04 — End: 2016-08-08

## 2016-08-04 MED ORDER — SUGAMMADEX SODIUM 200 MG/2ML IV SOLN
INTRAVENOUS | Status: DC | PRN
  Administered 2016-08-04: 200 mg via INTRAVENOUS

## 2016-08-04 MED ORDER — DIPHENHYDRAMINE HCL 12.5 MG/5ML PO ELIX: 12.5000 mg | ORAL_SOLUTION | Freq: Four times a day (QID) | ORAL | Status: DC | PRN

## 2016-08-04 MED ORDER — ACETAMINOPHEN 500 MG PO TABS
1000.0000 mg | ORAL_TABLET | Freq: Four times a day (QID) | ORAL | Status: DC
  Administered 2016-08-04 – 2016-08-07 (×9): 1000 mg via ORAL
  Filled 2016-08-04 (×10): qty 2

## 2016-08-04 MED ORDER — INSULIN ASPART 100 UNIT/ML ~~LOC~~ SOLN
0.0000 [IU] | SUBCUTANEOUS | Status: DC
  Administered 2016-08-04 (×2): 2 [IU] via SUBCUTANEOUS
  Administered 2016-08-05: 4 [IU] via SUBCUTANEOUS
  Administered 2016-08-05: 2 [IU] via SUBCUTANEOUS
  Administered 2016-08-05: 4 [IU] via SUBCUTANEOUS
  Administered 2016-08-05 (×2): 2 [IU] via SUBCUTANEOUS
  Administered 2016-08-06: 4 [IU] via SUBCUTANEOUS
  Administered 2016-08-07: 2 [IU] via SUBCUTANEOUS
  Administered 2016-08-07: 4 [IU] via SUBCUTANEOUS

## 2016-08-04 MED ORDER — HEMOSTATIC AGENTS (NO CHARGE) OPTIME
TOPICAL | Status: DC | PRN
  Administered 2016-08-04: 1 via TOPICAL

## 2016-08-04 MED ORDER — HYDROMORPHONE HCL 1 MG/ML IJ SOLN
INTRAMUSCULAR | Status: AC
  Filled 2016-08-04: qty 1.5

## 2016-08-04 MED ORDER — PROMETHAZINE HCL 25 MG/ML IJ SOLN: 6.2500 mg | INTRAMUSCULAR | Status: DC | PRN

## 2016-08-04 MED ORDER — SODIUM CHLORIDE 0.9 % IR SOLN
Status: DC | PRN
  Administered 2016-08-04: 3000 mL

## 2016-08-04 MED ORDER — LIDOCAINE 2% (20 MG/ML) 5 ML SYRINGE
INTRAMUSCULAR | Status: AC
  Filled 2016-08-04: qty 5

## 2016-08-04 MED ORDER — ONDANSETRON HCL 4 MG/2ML IJ SOLN: 4.0000 mg | Freq: Four times a day (QID) | INTRAMUSCULAR | Status: DC | PRN

## 2016-08-04 SURGICAL SUPPLY — 79 items
ADH SKN CLS APL DERMABOND .7 (GAUZE/BANDAGES/DRESSINGS)
ADH SKN CLS LQ APL DERMABOND (GAUZE/BANDAGES/DRESSINGS) ×1
APL SRG 22X2 LUM MLBL SLNT (VASCULAR PRODUCTS) ×2
APL SRG 7X2 LUM MLBL SLNT (VASCULAR PRODUCTS)
APPLICATOR COTTON TIP 6IN STRL (MISCELLANEOUS) ×1 IMPLANT
APPLICATOR TIP COSEAL (VASCULAR PRODUCTS) IMPLANT
APPLICATOR TIP EXT COSEAL (VASCULAR PRODUCTS) ×2 IMPLANT
BLADE SURG 11 STRL SS (BLADE) IMPLANT
CANISTER SUCTION 2500CC (MISCELLANEOUS) ×2 IMPLANT
CATH KIT ON Q 5IN SLV (PAIN MANAGEMENT) IMPLANT
CATH THORACIC 28FR (CATHETERS) ×1 IMPLANT
CATH THORACIC 36FR (CATHETERS) IMPLANT
CATH THORACIC 36FR RT ANG (CATHETERS) IMPLANT
CLEANER TIP ELECTROSURG 2X2 (MISCELLANEOUS) ×2 IMPLANT
CLIP TI MEDIUM 6 (CLIP) IMPLANT
CONN ST 1/4X3/8  BEN (MISCELLANEOUS) ×2
CONN ST 1/4X3/8 BEN (MISCELLANEOUS) IMPLANT
CONT SPEC 4OZ CLIKSEAL STRL BL (MISCELLANEOUS) ×4 IMPLANT
DERMABOND ADHESIVE PROPEN (GAUZE/BANDAGES/DRESSINGS) ×1
DERMABOND ADVANCED (GAUZE/BANDAGES/DRESSINGS)
DERMABOND ADVANCED .7 DNX12 (GAUZE/BANDAGES/DRESSINGS) IMPLANT
DERMABOND ADVANCED .7 DNX6 (GAUZE/BANDAGES/DRESSINGS) IMPLANT
DRAIN CHANNEL 28F RND 3/8 FF (WOUND CARE) ×1 IMPLANT
DRAPE HALF SHEET 40X57 (DRAPES) ×1 IMPLANT
DRAPE LAPAROSCOPIC ABDOMINAL (DRAPES) ×2 IMPLANT
DRAPE WARM FLUID 44X44 (DRAPE) ×2 IMPLANT
DRILL BIT 7/64X5 (BIT) IMPLANT
ELECT BLADE 4.0 EZ CLEAN MEGAD (MISCELLANEOUS) ×4
ELECT REM PT RETURN 9FT ADLT (ELECTROSURGICAL) ×2
ELECTRODE BLDE 4.0 EZ CLN MEGD (MISCELLANEOUS) ×1 IMPLANT
ELECTRODE REM PT RTRN 9FT ADLT (ELECTROSURGICAL) ×1 IMPLANT
GAUZE SPONGE 4X4 12PLY STRL (GAUZE/BANDAGES/DRESSINGS) ×2 IMPLANT
GLOVE BIO SURGEON STRL SZ 6.5 (GLOVE) ×5 IMPLANT
GLOVE SURG SS PI 6.0 STRL IVOR (GLOVE) ×1 IMPLANT
GOWN STRL REUS W/ TWL LRG LVL3 (GOWN DISPOSABLE) ×3 IMPLANT
GOWN STRL REUS W/TWL LRG LVL3 (GOWN DISPOSABLE) ×6
KIT BASIN OR (CUSTOM PROCEDURE TRAY) ×2 IMPLANT
KIT ROOM TURNOVER OR (KITS) ×2 IMPLANT
KIT SUCTION CATH 14FR (SUCTIONS) ×2 IMPLANT
NS IRRIG 1000ML POUR BTL (IV SOLUTION) ×4 IMPLANT
PACK CHEST (CUSTOM PROCEDURE TRAY) ×2 IMPLANT
PAD ARMBOARD 7.5X6 YLW CONV (MISCELLANEOUS) ×4 IMPLANT
PASSER SUT SWANSON 36MM LOOP (INSTRUMENTS) IMPLANT
SEALANT PROGEL (MISCELLANEOUS) IMPLANT
SEALANT SURG COSEAL 4ML (VASCULAR PRODUCTS) IMPLANT
SEALANT SURG COSEAL 8ML (VASCULAR PRODUCTS) ×1 IMPLANT
SOLUTION ANTI FOG 6CC (MISCELLANEOUS) ×2 IMPLANT
SUT ETHILON 3 0 PS 1 (SUTURE) ×1 IMPLANT
SUT PROLENE 3 0 SH DA (SUTURE) IMPLANT
SUT PROLENE 4 0 RB 1 (SUTURE)
SUT PROLENE 4-0 RB1 .5 CRCL 36 (SUTURE) IMPLANT
SUT SILK  1 MH (SUTURE) ×6
SUT SILK 1 MH (SUTURE) ×4 IMPLANT
SUT SILK 2 0SH CR/8 30 (SUTURE) IMPLANT
SUT SILK 3 0SH CR/8 30 (SUTURE) IMPLANT
SUT VIC AB 0 CT1 27 (SUTURE) ×2
SUT VIC AB 0 CT1 27XBRD ANBCTR (SUTURE) IMPLANT
SUT VIC AB 1 CTX 18 (SUTURE) IMPLANT
SUT VIC AB 1 CTX 36 (SUTURE)
SUT VIC AB 1 CTX36XBRD ANBCTR (SUTURE) IMPLANT
SUT VIC AB 2-0 CTX 36 (SUTURE) IMPLANT
SUT VIC AB 3-0 SH 27 (SUTURE) ×26
SUT VIC AB 3-0 SH 27X BRD (SUTURE) IMPLANT
SUT VIC AB 3-0 SH 8-18 (SUTURE) ×1 IMPLANT
SUT VIC AB 3-0 X1 27 (SUTURE) IMPLANT
SUT VICRYL 2 TP 1 (SUTURE) IMPLANT
SWAB COLLECTION DEVICE MRSA (MISCELLANEOUS) IMPLANT
SYSTEM SAHARA CHEST DRAIN ATS (WOUND CARE) ×2 IMPLANT
TAPE CLOTH 4X10 WHT NS (GAUZE/BANDAGES/DRESSINGS) ×2 IMPLANT
TAPE CLOTH SURG 4X10 WHT LF (GAUZE/BANDAGES/DRESSINGS) ×1 IMPLANT
TIP APPLICATOR SPRAY EXTEND 16 (VASCULAR PRODUCTS) IMPLANT
TOWEL OR 17X24 6PK STRL BLUE (TOWEL DISPOSABLE) ×3 IMPLANT
TOWEL OR 17X26 10 PK STRL BLUE (TOWEL DISPOSABLE) ×4 IMPLANT
TRAP SPECIMEN MUCOUS 40CC (MISCELLANEOUS) IMPLANT
TRAY FOLEY CATH SILVER 16FR LF (SET/KITS/TRAYS/PACK) ×2 IMPLANT
TROCAR BLADELESS 12MM (ENDOMECHANICALS) IMPLANT
TUBE ANAEROBIC SPECIMEN COL (MISCELLANEOUS) IMPLANT
TUBE CONNECTING 20X1/4 (TUBING) ×1 IMPLANT
WATER STERILE IRR 1000ML POUR (IV SOLUTION) ×4 IMPLANT

## 2016-08-04 NOTE — Anesthesia Procedure Notes (Signed)
Procedure Name: Intubation Date/Time: 08/04/2016 7:58 AM Performed by: Kyung Rudd Pre-anesthesia Checklist: Patient identified, Emergency Drugs available, Suction available and Patient being monitored Patient Re-evaluated:Patient Re-evaluated prior to inductionOxygen Delivery Method: Circle system utilized Preoxygenation: Pre-oxygenation with 100% oxygen Intubation Type: IV induction Ventilation: Mask ventilation without difficulty and Oral airway inserted - appropriate to patient size Laryngoscope Size: Mac and 4 Grade View: Grade I Endobronchial tube: Left, Double lumen EBT, EBT position confirmed by auscultation and EBT position confirmed by fiberoptic bronchoscope and 39 Fr Number of attempts: 1 Airway Equipment and Method: Stylet Placement Confirmation: ETT inserted through vocal cords under direct vision,  positive ETCO2 and breath sounds checked- equal and bilateral Tube secured with: Tape Dental Injury: Teeth and Oropharynx as per pre-operative assessment

## 2016-08-04 NOTE — H&P (View-Only) (Signed)
Patient ID: [name], male   DOB: 1950-07-10, 66 y.o.   MRN: [id number] TCTS DAILY ICU PROGRESS NOTE                   [address]            [place], [address]          [phone]   2 Days Post-Op Procedure(s) (LRB): VIDEO BRONCHOSCOPY WITH INSERTION OF INTERBRONCHIAL VALVE (IBV) (Right)  Total Length of Stay:  LOS: 15 days   Subjective: Feels ok this am  Objective: Vital signs in last 24 hours: Temp:  [97.5 F (36.4 C)-98.4 F (36.9 C)] 97.9 F (36.6 C) (01/31 0400) Pulse Rate:  [51-84] 60 (01/31 0700) Cardiac Rhythm: Normal sinus rhythm (01/31 0000) Resp:  [11-29] 18 (01/31 0700) BP: (95-149)/(57-84) 128/68 (01/31 0600) SpO2:  [90 %-97 %] 97 % (01/31 0700)  Filed Weights   07/26/16 0600 07/29/16 0500 07/31/16 0600  Weight: 152 lb 8.9 oz (69.2 kg) 148 lb 13 oz (67.5 kg) 147 lb 11.3 oz (67 kg)    Weight change:    Hemodynamic parameters for last 24 hours:    Intake/Output from previous day: 01/30 0701 - 01/31 0700 In: 480 [P.O.:480] Out: 1550 [Urine:1550]  Intake/Output this shift: No intake/output data recorded.  Current Meds: Scheduled Meds: . bisacodyl  10 mg Oral Daily  . enoxaparin (LOVENOX) injection  40 mg Subcutaneous Q24H  . mometasone-formoterol  2 puff Inhalation BID  . senna-docusate  1 tablet Oral QHS  . tamsulosin  0.4 mg Oral QPC breakfast  . tiotropium  1 capsule Inhalation Daily   Continuous Infusions: . dextrose 5 % and 0.45% NaCl Stopped (07/25/16 1100)  . lactated ringers Stopped (08/02/16 0700)   PRN Meds:.albuterol, oxyCODONE, potassium chloride (KCL MULTIRUN) 30 mEq in 265 mL IVPB, sodium chloride, sodium chloride flush, traMADol  General appearance: alert and cooperative Neurologic: intact Heart: regular rate and rhythm, S1, S2 normal, no murmur, click, rub or gallop Lungs: diminished breath sounds bibasilar Abdomen: soft, non-tender; bowel sounds normal; no masses,  no organomegaly Extremities:  extremities normal, atraumatic, no cyanosis or edema and Homans sign is negative, no sign of DVT Wound: persistent air leak with cough  Lab Results: CBC: Recent Labs  08/01/16 0853 08/03/16 0409  WBC 10.6* 8.7  HGB 11.5* 11.1*  HCT 35.4* 33.8*  PLT 361 349   BMET:  Recent Labs  08/01/16 0853 08/03/16 0409  NA 135 135  K 4.5 4.2  CL 100* 103  CO2 28 25  GLUCOSE 101* 101*  BUN 17 14  CREATININE 0.79 0.68  CALCIUM 8.9 8.6*    CMET: Lab Results  Component Value Date   WBC 8.7 08/03/2016   HGB 11.1 (L) 08/03/2016   HCT 33.8 (L) 08/03/2016   PLT 349 08/03/2016   GLUCOSE 101 (H) 08/03/2016   CHOL 243 (H) 07/11/2016   TRIG 102 07/11/2016   HDL 75 07/11/2016   LDLCALC 148 (H) 07/11/2016   ALT 17 07/21/2016   AST 30 07/21/2016   NA 135 08/03/2016   K 4.2 08/03/2016   CL 103 08/03/2016   CREATININE 0.68 08/03/2016   BUN 14 08/03/2016   CO2 25 08/03/2016   INR 0.94 07/15/2016      PT/INR: No results for input(s): LABPROT, INR in the last 72 hours. Radiology: No results found.   Assessment/Plan: S/P Procedure(s) (LRB): VIDEO BRONCHOSCOPY WITH INSERTION OF INTERBRONCHIAL VALVE (IBV) (Right) Mobilize With persistent  air leak , large with cough I have recommended to patient proceeding with right vats to find and repair air leak , plan in am Risks and options discussed , patient agreeable      [name] 08/03/2016 7:38 AM

## 2016-08-04 NOTE — Interval H&P Note (Signed)
preop-progress  Interval Note:  08/04/2016 6:47 AM  [name]  has presented today for surgery, with the diagnosis of AIR LEAK  The various methods of treatment have been discussed with the patient and family. After consideration of risks, benefits and other options for treatment, the patient has consented to  Procedure(s): VIDEO ASSISTED THORACOSCOPY (Right) as a surgical intervention .  The patient's history has been reviewed, patient examined, no change in status, stable for surgery.  I have reviewed the patient's chart and labs.  Questions were answered to the patient's satisfaction.    Lab Results  Component Value Date   WBC 9.3 08/04/2016   HGB 11.4 (L) 08/04/2016   HCT 34.6 (L) 08/04/2016   PLT 369 08/04/2016   GLUCOSE 113 (H) 08/04/2016   CHOL 243 (H) 07/11/2016   TRIG 102 07/11/2016   HDL 75 07/11/2016   LDLCALC 148 (H) 07/11/2016   ALT 20 08/04/2016   AST 17 08/04/2016   NA 135 08/04/2016   K 4.3 08/04/2016   CL 100 (L) 08/04/2016   CREATININE 0.71 08/04/2016   BUN 10 08/04/2016   CO2 26 08/04/2016   INR 1.06 08/04/2016    [name]

## 2016-08-04 NOTE — Brief Op Note (Addendum)
08/04/2016  10:54 AM  PATIENT:  [name]  66 y.o. male  PRE-OPERATIVE DIAGNOSIS:  AIR LEAK  POST-OPERATIVE DIAGNOSIS:  AIR LEAK  PROCEDURE:  Procedure(s): RIGHT VIDEO ASSISTED THORACOSCOPY WITH SUTURE CLOSURE OF AIR LEAK (Right)  SURGEON:  Surgeon(s) and Role:    * [name], MD - Primary  PHYSICIAN ASSISTANT:  Nicholes Rough, PA-C   ANESTHESIA:   general  EBL:  Total I/O In: 1000 [I.V.:1000] Out: 475 [Urine:425; Blood:50]  BLOOD ADMINISTERED:none  DRAINS: standard right chest tube and blake drain   LOCAL MEDICATIONS USED:  NONE  SPECIMEN:  No Specimen  DISPOSITION OF SPECIMEN:  N/A  COUNTS:  YES   DICTATION: .Dragon Dictation  PLAN OF CARE: Admit to inpatient   PATIENT DISPOSITION:  ICU - extubated and stable.   Delay start of Pharmacological VTE agent (>24hrs) due to surgical blood loss or risk of bleeding: yes

## 2016-08-04 NOTE — Anesthesia Postprocedure Evaluation (Signed)
Anesthesia Post Note  Patient: [name]  Procedure(s) Performed: Procedure(s) (LRB): RIGHT VIDEO ASSISTED THORACOSCOPY WITH SUTURE CLOSURE OF AIR LEAK (Right)  Patient location during evaluation: PACU Anesthesia Type: General Level of consciousness: sedated and patient cooperative Pain management: pain level controlled Vital Signs Assessment: post-procedure vital signs reviewed and stable Respiratory status: spontaneous breathing Cardiovascular status: stable Anesthetic complications: no       Last Vitals:  Vitals:   08/04/16 1532 08/04/16 1600  BP:  107/65  Pulse:  69  Resp:  11  Temp: 36.5 C     Last Pain:  Vitals:   08/04/16 1532  TempSrc: Oral  PainSc:                  [name]

## 2016-08-04 NOTE — Transfer of Care (Signed)
Immediate Anesthesia Transfer of Care Note  Patient: [name]  Procedure(s) Performed: Procedure(s): RIGHT VIDEO ASSISTED THORACOSCOPY WITH SUTURE CLOSURE OF AIR LEAK (Right)  Patient Location: PACU  Anesthesia Type:General  Level of Consciousness: awake, alert  and oriented  Airway & Oxygen Therapy: Patient Spontanous Breathing and Patient connected to face mask oxygen  Post-op Assessment: Report given to RN, Post -op Vital signs reviewed and stable and Patient moving all extremities X 4  Post vital signs: Reviewed and stable  Last Vitals:  Vitals:   08/04/16 0600 08/04/16 1115  BP: 118/68   Pulse: 63   Resp: 14   Temp:  36.2 C    Last Pain:  Vitals:   08/04/16 0400  TempSrc: Oral  PainSc:       Patients Stated Pain Goal: 3 (70/78/67 5449)  Complications: No apparent anesthesia complications

## 2016-08-04 NOTE — Progress Notes (Signed)
      [address]       [place] [address]             [phone]      S/p re-exploratory VATS oversew of air leak  BP 117/66   Pulse 80   Temp 97.7 F (36.5 C) (Oral)   Resp 15   Ht 6' (1.829 m)   Wt 147 lb 11.3 oz (67 kg)   SpO2 95%   BMI 20.03 kg/m    Intake/Output Summary (Last 24 hours) at 08/04/16 1732 Last data filed at 08/04/16 1700  Gross per 24 hour  Intake             2790 ml  Output             2225 ml  Net              565 ml   Doing well early postop. Air leak decreased  Remo Lipps C. Roxan Hockey, MD [facility] [phone]

## 2016-08-05 ENCOUNTER — Inpatient Hospital Stay (HOSPITAL_COMMUNITY): Payer: Managed Care, Other (non HMO)

## 2016-08-05 ENCOUNTER — Encounter (HOSPITAL_COMMUNITY): Payer: Self-pay | Admitting: Cardiothoracic Surgery

## 2016-08-05 LAB — POCT I-STAT 3, ART BLOOD GAS (G3+)
Acid-base deficit: 3 mmol/L — ABNORMAL HIGH (ref 0.0–2.0)
Bicarbonate: 21.4 mmol/L (ref 20.0–28.0)
O2 Saturation: 98 %
Patient temperature: 98.6
TCO2: 22 mmol/L (ref 0–100)
pCO2 arterial: 35.3 mmHg (ref 32.0–48.0)
pH, Arterial: 7.391 (ref 7.350–7.450)
pO2, Arterial: 114 mmHg — ABNORMAL HIGH (ref 83.0–108.0)

## 2016-08-05 LAB — CBC
HCT: 39.3 % (ref 39.0–52.0)
HEMOGLOBIN: 12.9 g/dL — AB (ref 13.0–17.0)
MCH: 30 pg (ref 26.0–34.0)
MCHC: 32.8 g/dL (ref 30.0–36.0)
MCV: 91.4 fL (ref 78.0–100.0)
PLATELETS: 374 10*3/uL (ref 150–400)
RBC: 4.3 MIL/uL (ref 4.22–5.81)
RDW: 13.8 % (ref 11.5–15.5)
WBC: 13 10*3/uL — ABNORMAL HIGH (ref 4.0–10.5)

## 2016-08-05 LAB — GLUCOSE, CAPILLARY
Glucose-Capillary: 129 mg/dL — ABNORMAL HIGH (ref 65–99)
Glucose-Capillary: 134 mg/dL — ABNORMAL HIGH (ref 65–99)
Glucose-Capillary: 146 mg/dL — ABNORMAL HIGH (ref 65–99)
Glucose-Capillary: 164 mg/dL — ABNORMAL HIGH (ref 65–99)
Glucose-Capillary: 167 mg/dL — ABNORMAL HIGH (ref 65–99)
Glucose-Capillary: 87 mg/dL (ref 65–99)

## 2016-08-05 LAB — BASIC METABOLIC PANEL
Anion gap: 10 (ref 5–15)
BUN: 6 mg/dL (ref 6–20)
CHLORIDE: 98 mmol/L — AB (ref 101–111)
CO2: 24 mmol/L (ref 22–32)
CREATININE: 0.63 mg/dL (ref 0.61–1.24)
Calcium: 9.1 mg/dL (ref 8.9–10.3)
Glucose, Bld: 132 mg/dL — ABNORMAL HIGH (ref 65–99)
POTASSIUM: 3.9 mmol/L (ref 3.5–5.1)
SODIUM: 132 mmol/L — AB (ref 135–145)

## 2016-08-05 MED ORDER — ENSURE ENLIVE PO LIQD
237.0000 mL | Freq: Three times a day (TID) | ORAL | Status: DC
  Administered 2016-08-06 – 2016-08-14 (×18): 237 mL via ORAL

## 2016-08-05 NOTE — Care Management Note (Signed)
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  CM will continue to follow for discharge needs   Expected Discharge Date:  07/25/16               Expected Discharge Plan:     In-House Referral:     Discharge planning Services  CM Consult  Post Acute Care Choice:    Choice offered to:     DME Arranged:    DME Agency:     HH Arranged:    HH Agency:     Status of Service:  In process, will continue to follow  If discussed at Long Length of Stay Meetings, dates discussed:    Additional Comments: 08/05/2016  Discussed in LOS 08/04/16 - pt remains appropriate for continued stay.  Pt is now s/p 2nd VATS procedure  07/26/16 Discussed in LOS 07/26/16 - remains appropriate for continued stay Maryclare Labrador, RN 08/05/2016, 10:17 AM

## 2016-08-05 NOTE — Progress Notes (Signed)
CT surgery p.m. Rounds  Incisional pain better today Small air leak from chest tubes Coarse breath sounds, cough nonproductive Encourage by mouth nutrition to help heal staple lines

## 2016-08-05 NOTE — Progress Notes (Addendum)
      [address]       [place] [address]             [phone]      1 Day Post-Op Procedure(s) (LRB): RIGHT VIDEO ASSISTED THORACOSCOPY WITH SUTURE CLOSURE OF AIR LEAK (Right) Subjective: No issues overnight. Having some pain and his PCA is being changed out.   Objective: Vital signs in last 24 hours: Temp:  [97.2 F (36.2 C)-98.9 F (37.2 C)] 98.9 F (37.2 C) (02/02 0700) Pulse Rate:  [58-84] 84 (02/02 0700) Cardiac Rhythm: Normal sinus rhythm (02/02 0600) Resp:  [9-25] 17 (02/02 0700) BP: (107-143)/(62-83) 114/70 (02/02 0700) SpO2:  [91 %-100 %] 97 % (02/02 0700) Arterial Line BP: (68-166)/(56-73) 68/56 (02/02 0400) Weight:  [66.7 kg (147 lb)-74 kg (163 lb 2.3 oz)] 66.7 kg (147 lb) (02/02 0600)     Intake/Output from previous day: 02/01 0701 - 02/02 0700 In: 4010 [P.O.:180; I.V.:3780; IV Piggyback:50] Out: 0923 [Urine:3600; Blood:50; Chest Tube:160] Intake/Output this shift: No intake/output data recorded.  General appearance: alert, cooperative and no distress Heart: regular rate and rhythm, S1, S2 normal, no murmur, click, rub or gallop Lungs: clear to auscultation bilaterally Abdomen: soft, non-tender; bowel sounds normal; no masses,  no organomegaly Extremities: extremities normal, atraumatic, no cyanosis or edema Wound: clean and dry  Lab Results:  Recent Labs  08/04/16 0041 08/05/16 0415  WBC 9.3 13.0*  HGB 11.4* 12.9*  HCT 34.6* 39.3  PLT 369 374   BMET:  Recent Labs  08/04/16 0041 08/05/16 0415  NA 135 132*  K 4.3 3.9  CL 100* 98*  CO2 26 24  GLUCOSE 113* 132*  BUN 10 6  CREATININE 0.71 0.63  CALCIUM 8.9 9.1    PT/INR:  Recent Labs  08/04/16 0041  LABPROT 13.8  INR 1.06   ABG    Component Value Date/Time   PHART 7.391 08/05/2016 0417   HCO3 21.4 08/05/2016 0417   TCO2 22 08/05/2016 0417   ACIDBASEDEF 3.0 (H) 08/05/2016 0417   O2SAT 98.0 08/05/2016 0417   CBG (last 3)   Recent Labs  08/04/16 1929  08/04/16 2350 08/05/16 0354  GLUCAP 134* 128* 134*    Assessment/Plan: S/P Procedure(s) (LRB): RIGHT VIDEO ASSISTED THORACOSCOPY WITH SUTURE CLOSURE OF AIR LEAK (Right)   1. CV- NSR in the 80s. BP stable.  2. Pulm-remains on 2L [place]. CXR showed stable right apical pneumo with chest tube in place. Chest tube with 160cc out and + air leak-continue 3. Renal- 0.63 creatinine. Electrolytes okay. Great urine output.  4. H and H stable 5. Blood glucose level well controlled.   Plan: Continue chest tube. Foley out. Continue PCA for pain control.    LOS: 17 days    Elgie Collard 08/05/2016   Air leak much improved from preop, continue -10 cm suction I have seen and examined Kelby Fam and agree with the above assessment  and plan.  [name] MD [facility] [phone] Office [phone] 08/05/2016 3:42 PM

## 2016-08-06 ENCOUNTER — Inpatient Hospital Stay (HOSPITAL_COMMUNITY): Payer: Managed Care, Other (non HMO)

## 2016-08-06 LAB — COMPREHENSIVE METABOLIC PANEL
ALBUMIN: 2.8 g/dL — AB (ref 3.5–5.0)
ALK PHOS: 86 U/L (ref 38–126)
ALT: 17 U/L (ref 17–63)
ANION GAP: 12 (ref 5–15)
AST: 17 U/L (ref 15–41)
BILIRUBIN TOTAL: 0.5 mg/dL (ref 0.3–1.2)
BUN: 8 mg/dL (ref 6–20)
CALCIUM: 9.1 mg/dL (ref 8.9–10.3)
CO2: 28 mmol/L (ref 22–32)
Chloride: 95 mmol/L — ABNORMAL LOW (ref 101–111)
Creatinine, Ser: 0.62 mg/dL (ref 0.61–1.24)
GFR calc non Af Amer: >60 mL/min (ref >60)
GLUCOSE: 115 mg/dL — AB (ref 65–99)
POTASSIUM: 4.1 mmol/L (ref 3.5–5.1)
Sodium: 135 mmol/L (ref 135–145)
TOTAL PROTEIN: 6.2 g/dL — AB (ref 6.5–8.1)

## 2016-08-06 LAB — CBC
HEMATOCRIT: 38.4 % — AB (ref 39.0–52.0)
Hemoglobin: 12.4 g/dL — ABNORMAL LOW (ref 13.0–17.0)
MCH: 29.6 pg (ref 26.0–34.0)
MCHC: 32.3 g/dL (ref 30.0–36.0)
MCV: 91.6 fL (ref 78.0–100.0)
Platelets: 379 10*3/uL (ref 150–400)
RBC: 4.19 MIL/uL — ABNORMAL LOW (ref 4.22–5.81)
RDW: 13.4 % (ref 11.5–15.5)
WBC: 12.1 10*3/uL — ABNORMAL HIGH (ref 4.0–10.5)

## 2016-08-06 LAB — GLUCOSE, CAPILLARY
Glucose-Capillary: 107 mg/dL — ABNORMAL HIGH (ref 65–99)
Glucose-Capillary: 109 mg/dL — ABNORMAL HIGH (ref 65–99)
Glucose-Capillary: 100 mg/dL — ABNORMAL HIGH (ref 65–99)
Glucose-Capillary: 97 mg/dL (ref 65–99)
Glucose-Capillary: 169 mg/dL — ABNORMAL HIGH (ref 65–99)

## 2016-08-06 MED ORDER — CHLORHEXIDINE GLUCONATE CLOTH 2 % EX PADS
6.0000 | MEDICATED_PAD | Freq: Every day | CUTANEOUS | Status: DC
  Administered 2016-08-06 – 2016-08-07 (×2): 6 via TOPICAL

## 2016-08-06 MED ORDER — SODIUM CHLORIDE 0.9% FLUSH
10.0000 mL | INTRAVENOUS | Status: DC | PRN
  Administered 2016-08-09: 10 mL
  Filled 2016-08-06: qty 40

## 2016-08-06 NOTE — Progress Notes (Signed)
CT surgery p.m. Rounds  Excellent day Chest tube on water seal No significant air leak We'll hope remove posterior tube tomorrow

## 2016-08-06 NOTE — Progress Notes (Signed)
2 Days Post-Op Procedure(s) (LRB): RIGHT VIDEO ASSISTED THORACOSCOPY WITH SUTURE CLOSURE OF AIR LEAK (Right) Subjective: CXR improved Min airleak, drainage 300 Now on water seal Objective: Vital signs in last 24 hours: Temp:  [98.2 F (36.8 C)-98.4 F (36.9 C)] 98.4 F (36.9 C) (02/03 0800) Pulse Rate:  [57-98] 80 (02/03 1000) Cardiac Rhythm: Normal sinus rhythm (02/03 1000) Resp:  [9-24] 20 (02/03 1000) BP: (101-149)/(64-84) 112/69 (02/03 1000) SpO2:  [95 %-100 %] 97 % (02/03 1000) FiO2 (%):  [0 %] 0 % (02/03 0800) Weight:  [148 lb 2.4 oz (67.2 kg)] 148 lb 2.4 oz (67.2 kg) (02/03 0600)  Hemodynamic parameters for last 24 hours:  stable  Intake/Output from previous day: 02/02 0701 - 02/03 0700 In: 965 [P.O.:360; I.V.:605] Out: 2205 [Urine:1950; Chest Tube:255] Intake/Output this shift: Total I/O In: 400 [P.O.:400] Out: -   Lungs clear Had BM Lab Results:  Recent Labs  08/05/16 0415 08/06/16 0801  WBC 13.0* 12.1*  HGB 12.9* 12.4*  HCT 39.3 38.4*  PLT 374 379   BMET:  Recent Labs  08/05/16 0415 08/06/16 0801  NA 132* 135  K 3.9 4.1  CL 98* 95*  CO2 24 28  GLUCOSE 132* 115*  BUN 6 8  CREATININE 0.63 0.62  CALCIUM 9.1 9.1    PT/INR:  Recent Labs  08/04/16 0041  LABPROT 13.8  INR 1.06   ABG    Component Value Date/Time   PHART 7.391 08/05/2016 0417   HCO3 21.4 08/05/2016 0417   TCO2 22 08/05/2016 0417   ACIDBASEDEF 3.0 (H) 08/05/2016 0417   O2SAT 98.0 08/05/2016 0417   CBG (last 3)   Recent Labs  08/06/16 0000 08/06/16 0420 08/06/16 0847  GLUCAP 87 107* 109*    Assessment/Plan: S/P Procedure(s) (LRB): RIGHT VIDEO ASSISTED THORACOSCOPY WITH SUTURE CLOSURE OF AIR LEAK (Right) Mobilize pleurovac water seal   LOS: 18 days    [name] 08/06/2016

## 2016-08-07 ENCOUNTER — Inpatient Hospital Stay (HOSPITAL_COMMUNITY): Payer: Managed Care, Other (non HMO)

## 2016-08-07 LAB — CBC
HCT: 36.4 % — ABNORMAL LOW (ref 39.0–52.0)
Hemoglobin: 11.6 g/dL — ABNORMAL LOW (ref 13.0–17.0)
MCH: 29.1 pg (ref 26.0–34.0)
MCHC: 31.9 g/dL (ref 30.0–36.0)
MCV: 91.5 fL (ref 78.0–100.0)
Platelets: 378 10*3/uL (ref 150–400)
RBC: 3.98 MIL/uL — ABNORMAL LOW (ref 4.22–5.81)
RDW: 13.4 % (ref 11.5–15.5)
WBC: 9.4 10*3/uL (ref 4.0–10.5)

## 2016-08-07 LAB — GLUCOSE, CAPILLARY
Glucose-Capillary: 108 mg/dL — ABNORMAL HIGH (ref 65–99)
Glucose-Capillary: 114 mg/dL — ABNORMAL HIGH (ref 65–99)
Glucose-Capillary: 118 mg/dL — ABNORMAL HIGH (ref 65–99)
Glucose-Capillary: 136 mg/dL — ABNORMAL HIGH (ref 65–99)
Glucose-Capillary: 192 mg/dL — ABNORMAL HIGH (ref 65–99)
Glucose-Capillary: 87 mg/dL (ref 65–99)

## 2016-08-07 LAB — BASIC METABOLIC PANEL
Anion gap: 9 (ref 5–15)
BUN: 8 mg/dL (ref 6–20)
CO2: 27 mmol/L (ref 22–32)
Calcium: 8.9 mg/dL (ref 8.9–10.3)
Chloride: 98 mmol/L — ABNORMAL LOW (ref 101–111)
Creatinine, Ser: 0.62 mg/dL (ref 0.61–1.24)
GFR calc Af Amer: >60 mL/min (ref >60)
GFR calc non Af Amer: >60 mL/min (ref >60)
Glucose, Bld: 105 mg/dL — ABNORMAL HIGH (ref 65–99)
Potassium: 4.1 mmol/L (ref 3.5–5.1)
Sodium: 134 mmol/L — ABNORMAL LOW (ref 135–145)

## 2016-08-07 MED ORDER — DEXTROSE 5 % IV SOLN
1.5000 g | Freq: Two times a day (BID) | INTRAVENOUS | Status: DC
  Administered 2016-08-07 – 2016-08-08 (×3): 1.5 g via INTRAVENOUS
  Filled 2016-08-07 (×3): qty 1.5

## 2016-08-07 NOTE — Progress Notes (Signed)
3 Days Post-Op Procedure(s) (LRB): RIGHT VIDEO ASSISTED THORACOSCOPY WITH SUTURE CLOSURE OF AIR LEAK (Right) Subjective: Did not sleep well Chest x-ray with clear right lung field, no pneumothorax Minimal chest tube drainage No air leak except with cough, patient on waterseal Afebrile but with some drainage from old chest tube site-Zinacef started Objective: Vital signs in last 24 hours: Temp:  [97.5 F (36.4 C)-98.8 F (37.1 C)] 98.8 F (37.1 C) (02/04 0800) Pulse Rate:  [64-90] 72 (02/04 0800) Cardiac Rhythm: Normal sinus rhythm (02/04 0800) Resp:  [10-21] 10 (02/04 0800) BP: (101-121)/(56-83) 120/68 (02/04 0800) SpO2:  [94 %-100 %] 94 % (02/04 0842) FiO2 (%):  [0 %-2 %] 0 % (02/04 0800) Weight:  [147 lb 7.8 oz (66.9 kg)] 147 lb 7.8 oz (66.9 kg) (02/04 0500)  Hemodynamic parameters for last 24 hours:  stable Intake/Output from previous day: 02/03 0701 - 02/04 0700 In: 1458.3 [P.O.:1400; I.V.:58.3] Out: 1750 [Urine:1650; Chest Tube:100] Intake/Output this shift: No intake/output data recorded.       Exam    General- alert and comfortable   Lungs- clear without rales, wheezes   Cor- regular rate and rhythm, no murmur , gallop   Abdomen- soft, non-tender   Extremities - warm, non-tender, minimal edema   Neuro- oriented, appropriate, no focal weakness   Lab Results:  Recent Labs  08/06/16 0801 08/07/16 0349  WBC 12.1* 9.4  HGB 12.4* 11.6*  HCT 38.4* 36.4*  PLT 379 378   BMET:  Recent Labs  08/06/16 0801 08/07/16 0349  NA 135 134*  K 4.1 4.1  CL 95* 98*  CO2 28 27  GLUCOSE 115* 105*  BUN 8 8  CREATININE 0.62 0.62  CALCIUM 9.1 8.9    PT/INR: No results for input(s): LABPROT, INR in the last 72 hours. ABG    Component Value Date/Time   PHART 7.391 08/05/2016 0417   HCO3 21.4 08/05/2016 0417   TCO2 22 08/05/2016 0417   ACIDBASEDEF 3.0 (H) 08/05/2016 0417   O2SAT 98.0 08/05/2016 0417   CBG (last 3)   Recent Labs  08/07/16 0011 08/07/16 0401  08/07/16 0806  GLUCAP 87 108* 136*    Assessment/Plan: S/P Procedure(s) (LRB): RIGHT VIDEO ASSISTED THORACOSCOPY WITH SUTURE CLOSURE OF AIR LEAK (Right) Leave chest tubes, waterseal Continue to mobilize patient encourage nutrition   LOS: 19 days    Tharon Aquas Trigt III 08/07/2016

## 2016-08-07 NOTE — Progress Notes (Signed)
Patient reports pain level of 7.  Patient only requests half of what he has been receiving ('5mg'$  versus '10mg'$ ) of oxycodone.  Patient states he does not "want to get addicted" and that he can "bear it."  Patient educated about pain control and right to refuse medication.  Will continue to monitor.  Clyda Hurdle RN

## 2016-08-07 NOTE — Progress Notes (Signed)
CT surgery p.m. Rounds  Somewhat fatigued today walked 1 No airleak except with cough Afebrile Chest incisions clean and dry

## 2016-08-08 ENCOUNTER — Inpatient Hospital Stay (HOSPITAL_COMMUNITY): Payer: Managed Care, Other (non HMO)

## 2016-08-08 LAB — COMPREHENSIVE METABOLIC PANEL
ALT: 18 U/L (ref 17–63)
AST: 18 U/L (ref 15–41)
Albumin: 2.6 g/dL — ABNORMAL LOW (ref 3.5–5.0)
Alkaline Phosphatase: 75 U/L (ref 38–126)
Anion gap: 8 (ref 5–15)
BUN: 8 mg/dL (ref 6–20)
CO2: 28 mmol/L (ref 22–32)
Calcium: 8.7 mg/dL — ABNORMAL LOW (ref 8.9–10.3)
Chloride: 99 mmol/L — ABNORMAL LOW (ref 101–111)
Creatinine, Ser: 0.58 mg/dL — ABNORMAL LOW (ref 0.61–1.24)
GFR calc Af Amer: >60 mL/min (ref >60)
GFR calc non Af Amer: >60 mL/min (ref >60)
Glucose, Bld: 104 mg/dL — ABNORMAL HIGH (ref 65–99)
Potassium: 3.9 mmol/L (ref 3.5–5.1)
Sodium: 135 mmol/L (ref 135–145)
Total Bilirubin: 0.4 mg/dL (ref 0.3–1.2)
Total Protein: 5.8 g/dL — ABNORMAL LOW (ref 6.5–8.1)

## 2016-08-08 LAB — CBC
HCT: 33.9 % — ABNORMAL LOW (ref 39.0–52.0)
Hemoglobin: 11.1 g/dL — ABNORMAL LOW (ref 13.0–17.0)
MCH: 30 pg (ref 26.0–34.0)
MCHC: 32.7 g/dL (ref 30.0–36.0)
MCV: 91.6 fL (ref 78.0–100.0)
Platelets: 368 10*3/uL (ref 150–400)
RBC: 3.7 MIL/uL — ABNORMAL LOW (ref 4.22–5.81)
RDW: 13.4 % (ref 11.5–15.5)
WBC: 8.4 10*3/uL (ref 4.0–10.5)

## 2016-08-08 LAB — GLUCOSE, CAPILLARY
Glucose-Capillary: 101 mg/dL — ABNORMAL HIGH (ref 65–99)
Glucose-Capillary: 102 mg/dL — ABNORMAL HIGH (ref 65–99)
Glucose-Capillary: 108 mg/dL — ABNORMAL HIGH (ref 65–99)

## 2016-08-08 MED ORDER — PANTOPRAZOLE SODIUM 40 MG PO TBEC
40.0000 mg | DELAYED_RELEASE_TABLET | Freq: Every day | ORAL | Status: DC
  Administered 2016-08-09 – 2016-08-15 (×7): 40 mg via ORAL
  Filled 2016-08-08 (×7): qty 1

## 2016-08-08 MED ORDER — BISACODYL 5 MG PO TBEC: 10.0000 mg | DELAYED_RELEASE_TABLET | Freq: Every day | ORAL | Status: DC | PRN

## 2016-08-08 MED ORDER — ONDANSETRON HCL 4 MG/2ML IJ SOLN: 4.0000 mg | Freq: Four times a day (QID) | INTRAMUSCULAR | Status: DC | PRN

## 2016-08-08 MED ORDER — GUAIFENESIN ER 600 MG PO TB12: 600.0000 mg | ORAL_TABLET | Freq: Two times a day (BID) | ORAL | Status: DC | PRN

## 2016-08-08 MED ORDER — BISACODYL 10 MG RE SUPP: 10.0000 mg | Freq: Every day | RECTAL | Status: DC | PRN

## 2016-08-08 MED ORDER — ENOXAPARIN SODIUM 40 MG/0.4ML ~~LOC~~ SOLN
40.0000 mg | SUBCUTANEOUS | Status: DC
  Administered 2016-08-08 – 2016-08-14 (×7): 40 mg via SUBCUTANEOUS
  Filled 2016-08-08 (×7): qty 0.4

## 2016-08-08 MED ORDER — SODIUM CHLORIDE 0.9% FLUSH
3.0000 mL | Freq: Two times a day (BID) | INTRAVENOUS | Status: DC
  Administered 2016-08-08: 10 mL via INTRAVENOUS
  Administered 2016-08-08 – 2016-08-14 (×8): 3 mL via INTRAVENOUS

## 2016-08-08 MED ORDER — SODIUM CHLORIDE 0.9 % IV SOLN: 250.0000 mL | INTRAVENOUS | Status: DC | PRN

## 2016-08-08 MED ORDER — ONDANSETRON HCL 4 MG PO TABS: 4.0000 mg | ORAL_TABLET | Freq: Four times a day (QID) | ORAL | Status: DC | PRN

## 2016-08-08 MED ORDER — SODIUM CHLORIDE 0.9% FLUSH: 3.0000 mL | INTRAVENOUS | Status: DC | PRN

## 2016-08-08 NOTE — Progress Notes (Addendum)
T[facility] DAILY ICU PROGRESS NOTE                   [name]            [place] [address]          [phone]   4 Days Post-Op Procedure(s) (LRB): RIGHT VIDEO ASSISTED THORACOSCOPY WITH SUTURE CLOSURE OF AIR LEAK (Right)  Total Length of Stay:  LOS: 20 days   Subjective: Patient wants to sit up in chair.  Objective: Vital signs in last 24 hours: Temp:  [97.2 F (36.2 C)-98.3 F (36.8 C)] 97.9 F (36.6 C) (02/05 0800) Pulse Rate:  [50-87] 74 (02/05 0700) Cardiac Rhythm: Normal sinus rhythm (02/05 0400) Resp:  [10-23] 12 (02/05 0800) BP: (102-122)/(51-81) 122/80 (02/05 0700) SpO2:  [91 %-100 %] 94 % (02/05 0800) FiO2 (%):  [0 %] 0 % (02/04 1600)  Filed Weights   08/05/16 0600 08/06/16 0600 08/07/16 0500  Weight: 147 lb (66.7 kg) 148 lb 2.4 oz (67.2 kg) 147 lb 7.8 oz (66.9 kg)       Intake/Output from previous day: 02/04 0701 - 02/05 0700 In: 1860 [P.O.:1760; IV Piggyback:100] Out: 1885 [Urine:1825; Chest Tube:60]  Intake/Output this shift: No intake/output data recorded.  Current Meds: Scheduled Meds: . acetaminophen  1,000 mg Oral Q6H   Or  . acetaminophen (TYLENOL) oral liquid 160 mg/5 mL  1,000 mg Oral Q6H  . bisacodyl  10 mg Oral Daily  . cefUROXime (ZINACEF)  IV  1.5 g Intravenous Q12H  . Chlorhexidine Gluconate Cloth  6 each Topical Daily  . feeding supplement (ENSURE ENLIVE)  237 mL Oral TID WC  . fentaNYL   Intravenous Q4H  . insulin aspart  0-24 Units Subcutaneous Q4H  . mometasone-formoterol  2 puff Inhalation BID  . senna-docusate  1 tablet Oral QHS  . tamsulosin  0.4 mg Oral QPC breakfast  . tiotropium  1 capsule Inhalation Daily   Continuous Infusions: PRN Meds:.albuterol, diphenhydrAMINE **OR** diphenhydrAMINE, naloxone **AND** sodium chloride flush, ondansetron (ZOFRAN) IV, oxyCODONE, potassium chloride (KCL MULTIRUN) 30 mEq in 265 mL IVPB, sodium chloride flush, traMADol  General appearance: alert, cooperative and no  distress Heart: RRR Lungs: Diminshed on right Wound: Dressing is clean and dry  Lab Results: CBC: Recent Labs  08/07/16 0349 08/08/16 0213  WBC 9.4 8.4  HGB 11.6* 11.1*  HCT 36.4* 33.9*  PLT 378 368   BMET:  Recent Labs  08/07/16 0349 08/08/16 0213  NA 134* 135  K 4.1 3.9  CL 98* 99*  CO2 27 28  GLUCOSE 105* 104*  BUN 8 8  CREATININE 0.62 0.58*  CALCIUM 8.9 8.7*    CMET: Lab Results  Component Value Date   WBC 8.4 08/08/2016   HGB 11.1 (L) 08/08/2016   HCT 33.9 (L) 08/08/2016   PLT 368 08/08/2016   GLUCOSE 104 (H) 08/08/2016   CHOL 243 (H) 07/11/2016   TRIG 102 07/11/2016   HDL 75 07/11/2016   LDLCALC 148 (H) 07/11/2016   ALT 18 08/08/2016   AST 18 08/08/2016   NA 135 08/08/2016   K 3.9 08/08/2016   CL 99 (L) 08/08/2016   CREATININE 0.58 (L) 08/08/2016   BUN 8 08/08/2016   CO2 28 08/08/2016   INR 1.06 08/04/2016      PT/INR: No results for input(s): LABPROT, INR in the last 72 hours. Radiology: Dg Chest Port 1 View  Result Date: 08/08/2016 CLINICAL DATA:  Status post right upper lobectomy  in January, insertion of endobronchial valves on 08/01/2016 and right thoracoscopy for repaired air leak 08/04/2016 EXAM: PORTABLE CHEST 1 VIEW COMPARISON:  Single-view of the chest 08/07/2016 and 08/06/2016. FINDINGS: Two right chest tubes remain in place. Right pneumothorax appears slightly increased since yesterday's examination. Bibasilar atelectasis and small right pleural effusion are also mildly increased. Heart size is normal. IMPRESSION: Slight increase in a right apical pneumothorax with 2 chest tubes in place. Increased bibasilar atelectasis, worse on the right Electronically Signed   By: Inge Rise M.D.   On: 08/08/2016 07:35     Assessment/Plan: S/P Procedure(s) (LRB): RIGHT VIDEO ASSISTED THORACOSCOPY WITH SUTURE CLOSURE OF AIR LEAK (Right)  1. Pulmonary-Chest tube with 60 cc of output last 24 hours. Chest tube is to water seal. There is an air  leak with cough. CXR this am shows slight increase in right pneumothorax and bibasilar atelectasis. Will discuss with Dr. Servando Snare if need to put back on suction. On 2 liters of oxygen via [place]. Encourage incentive spirometer. 2. CBGs 108/101/102. No history of diabetes. Will stop accu checks and SS PRN. 3. Anemia-H and H 11.1 and 33.9 this am.   [name] 08/08/2016 8:30 AM   Patient seen this am , placed back on low suction on chest tube  Small leak from both chest tubes , will leave both  To stepdown I have seen and examined [name] and agree with the above assessment  and plan.  [name] MD Beeper [phone] Office [phone] 08/08/2016 11:43 AM

## 2016-08-08 NOTE — Progress Notes (Signed)
3cc of fentanyl PCA wasted in sharps container by Pxy[employer] 2WA. Witnessed by Meryl Crutch.   Fritz Pickerel, RN

## 2016-08-08 NOTE — Progress Notes (Addendum)
30m of fentanyl wasted in sink from two tubes of PCA tubing.  Witnessed by JNorberta KeensRN  SClyda HurdleRN  JNorberta Keens RN

## 2016-08-08 NOTE — Op Note (Signed)
[name], [name] NO.:  [id number]  MEDICAL RECORD NO.:  [id number]  LOCATION:                                 FACILITY:  PHYSICIAN:  [name], MD    DATE OF BIRTH:  17-Dec-1950  DATE OF PROCEDURE:  08/01/2016 DATE OF DISCHARGE:                              OPERATIVE REPORT   PREOPERATIVE DIAGNOSIS:  Persistent air leak following right upper lobectomy.  POSTOP DIAGNOSIS:  Persistent air leak following right upper lobectomy.  SURGICAL PROCEDURE:  Bronchoscopy and placement of Spiration intrabronchial valves x3 in the superior segment of the right lower lobe, in the medial and lateral branches of the right middle lobe.  SURGEON:  [name], MD.  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  Approximately 10 days prior, he had undergone a right upper lobectomy.  At that time, he had very incomplete fissures.  He had tolerated surgery well, but had a persistent air leak in spite of the usual maneuvers and without any evidence of improvement.  We recommend to the patient proceeding with bronchoscopy to confirm that the bronchial stump was intact and also to consider the placement of intrabronchial valves to try to decrease the leak.  The patient agreed and signed informed consent.  DESCRIPTION OF PROCEDURE:  The patient underwent general endotracheal anesthesia without incident.  A single-lumen endotracheal tube was placed.  Appropriate time-out was performed, and then we proceeded with video bronchoscopy of the trachea, right and left tracheobronchial trees.  The left tracheobronchial tree was clear of any lesions. Careful examination of the right upper lobe bronchus revealed an intact bronchial stump.  We then proceeded examining the right lower and middle lobes without any endobronchial lesions.  A Fogarty catheter was then used to isolate where the majority of the leak was  coming from.  With the balloon inflated in the bronchus intermedius, there was total stoppage of any air leak from the chest tubes.  In a systematic manner, we checked each of the subsegmental areas and no one single area was conclusively the sole area of leak.  With a high suspicion that the air leak was between the superior segment, we measured the size of the superior segment of bronchus and selected a 9 mm valve and deployed this valve at the takeoff of the superior segment of the right lower lobe. We then proceeded again with placement of including segmental bronchi and the most decreased in air that we noted was in the middle lobe.  We selected bronchial valves and placed them both in the lateral and medial RB4 and RB5.  RB4, 9 mm valve and RB5 a 7 mm valve.  At this point, we had seen a decrease in air leak, but not total elimination, but decided to not place any further endobronchial valves.  The patient was extubated in the operating room, tolerated the procedure without complication, was transferred to the recovery room for postoperative observation.  There was no blood loss involved.     [name], MD   ______________________________ [name], MD    EG/MEDQ  D:  08/06/2016  T:  08/07/2016  Job:  [id number]

## 2016-08-08 NOTE — Op Note (Signed)
NAME:  [name], WEARE NO.:  MEDICAL RECORD NO.:  [id number]  LOCATION:                                 FACILITY:  PHYSICIAN:  [name], MD         DATE OF BIRTH:  DATE OF PROCEDURE:  08/04/2016 DATE OF DISCHARGE:                              OPERATIVE REPORT   PREOPERATIVE DIAGNOSIS:  Persistent postoperative air leak.  POSTOPERATIVE DIAGNOSIS:  Persistent postoperative air leak.  SURGICAL PROCEDURE:  Reoperation with right video-assisted thoracoscopy for persistent air leak with oversewing of the right lower lobe air leak.  SURGEON:  [name], MD.  FIRST ASSISTANT:  Shaaron Adler, [place].  BRIEF HISTORY:  The patient is a 66 year old male, who 10 days previously had undergone right upper lobectomy for stage IB carcinoma of the lung.  He has tolerated right upper lobectomy well with exception of a persistent air leak.  Four days previously, we had attempted to place pulmonary valves to decrease the air leak, but with continued observation, this did not seem to improve much and we recommended to the patient that we perform redo surgery on the right chest to try to localize and close the air leak.  The patient agreed and signed informed consent.  DESCRIPTION OF PROCEDURE:  The patient underwent general endotracheal anesthesia with a double lumen endotracheal tube.  The right lung was collapsed.  The previously placed chest tubes were removed.  The right chest was prepped with Betadine and draped in sterile manner. Appropriate time-out was performed and we proceeded with placement of a small port incision anterior and superior to the previous surgical site and entered the chest with a 30 degree video scope.  We carefully examined the lung.  There were no obvious blebs that had popped.  The right upper lobe bronchial stump appeared intact.  A second port site was placed slightly more posterior and through the 2 port sites, we were able to  manipulate the lung.  Placed the patient in the head-down position, and instilled saline into the chest and gently ventilated the right lung.  The primary areas of air leak were along the staple suture line along the superior segment.  Using the scope and working through the more anterior side, we oversewed with interrupted 2-0 Vicryl sutures.  The previously placed staple line along the left middle lobe did not appear to be leaking.  With multiple areas of oversewn, we then again tested for air leak and this was markedly reduced.  Still some small leak along the needle sites.  CoSeal was placed along the suture sites to further seal any air leak and again we tested it and there was a major significant improvement in the evidence of leakage.  At this point, we then placed a standard 28 chest tube and a 28 Blake drain. The Blake drain posteriorly and the remaining chest tube anteriorly and secured them in place.  The lung was reinflated and with the patient prone on the ventilator, there was a very small air leak with positive pressure, much improved from the preoperative state.  We then secured the chest tube in place.  Weaned and extubated the patient in the operating room. Sponge and needle count was reported as correct at the completion of procedure.  The patient tolerated the procedure without obvious complication.  Blood loss was minimal.  He was transferred to the recovery room for postoperative observation.     [name], MD     EG/MEDQ  D:  08/07/2016  T:  08/08/2016  Job:  [id number]

## 2016-08-09 ENCOUNTER — Inpatient Hospital Stay (HOSPITAL_COMMUNITY): Payer: Managed Care, Other (non HMO)

## 2016-08-09 LAB — CBC
HCT: 38.8 % — ABNORMAL LOW (ref 39.0–52.0)
Hemoglobin: 12.6 g/dL — ABNORMAL LOW (ref 13.0–17.0)
MCH: 29.9 pg (ref 26.0–34.0)
MCHC: 32.5 g/dL (ref 30.0–36.0)
MCV: 92.2 fL (ref 78.0–100.0)
Platelets: 388 10*3/uL (ref 150–400)
RBC: 4.21 MIL/uL — ABNORMAL LOW (ref 4.22–5.81)
RDW: 13.6 % (ref 11.5–15.5)
WBC: 10.6 10*3/uL — ABNORMAL HIGH (ref 4.0–10.5)

## 2016-08-09 LAB — BASIC METABOLIC PANEL
Anion gap: 13 (ref 5–15)
BUN: 8 mg/dL (ref 6–20)
CO2: 29 mmol/L (ref 22–32)
Calcium: 9.3 mg/dL (ref 8.9–10.3)
Chloride: 94 mmol/L — ABNORMAL LOW (ref 101–111)
Creatinine, Ser: 0.76 mg/dL (ref 0.61–1.24)
GFR calc Af Amer: >60 mL/min (ref >60)
GFR calc non Af Amer: >60 mL/min (ref >60)
Glucose, Bld: 110 mg/dL — ABNORMAL HIGH (ref 65–99)
Potassium: 3.8 mmol/L (ref 3.5–5.1)
Sodium: 136 mmol/L (ref 135–145)

## 2016-08-09 MED ORDER — ALBUTEROL SULFATE (2.5 MG/3ML) 0.083% IN NEBU: 2.5000 mg | INHALATION_SOLUTION | Freq: Four times a day (QID) | RESPIRATORY_TRACT | Status: DC | PRN

## 2016-08-09 MED ORDER — ALBUTEROL SULFATE (2.5 MG/3ML) 0.083% IN NEBU: 3.0000 mL | INHALATION_SOLUTION | RESPIRATORY_TRACT | Status: AC

## 2016-08-09 NOTE — Progress Notes (Addendum)
      [address]       [place] [address]             [phone]       5 Days Post-Op Procedure(s) (LRB): RIGHT VIDEO ASSISTED THORACOSCOPY WITH SUTURE CLOSURE OF AIR LEAK (Right)  Subjective: Patient had pain at chest tube sites earlier this am.  Objective: Vital signs in last 24 hours: Temp:  [97.9 F (36.6 C)-99.2 F (37.3 C)] 99.2 F (37.3 C) (02/06 0529) Pulse Rate:  [66-94] 82 (02/06 0529) Cardiac Rhythm: Normal sinus rhythm (02/05 2000) Resp:  [12-29] 20 (02/06 0529) BP: (101-138)/(66-92) 138/92 (02/06 0529) SpO2:  [90 %-100 %] 96 % (02/06 0529) Weight:  [151 lb 3.8 oz (68.6 kg)] 151 lb 3.8 oz (68.6 kg) (02/06 0529)     Intake/Output from previous day: 02/05 0701 - 02/06 0700 In: 360 [P.O.:300; I.V.:10; IV Piggyback:50] Out: 6468 [EHOZY:2482; Chest Tube:90]   Physical Exam:  Cardiovascular: RRR Pulmonary: Clear on the left, expiratory wheezing on the right Abdomen: Soft, non tender, bowel sounds present. Extremities: Mild bilateral lower extremity edema. Wounds: Clean and dry.  No erythema or signs of infection. Chest Tubes: to suction, air leak that worsens with cough  Lab Results: CBC: Recent Labs  08/07/16 0349 08/08/16 0213  WBC 9.4 8.4  HGB 11.6* 11.1*  HCT 36.4* 33.9*  PLT 378 368   BMET:  Recent Labs  08/07/16 0349 08/08/16 0213  NA 134* 135  K 4.1 3.9  CL 98* 99*  CO2 27 28  GLUCOSE 105* 104*  BUN 8 8  CREATININE 0.62 0.58*  CALCIUM 8.9 8.7*    PT/INR: No results for input(s): LABPROT, INR in the last 72 hours. ABG:  INR: Will add last result for INR, ABG once components are confirmed Will add last 4 CBG results once components are confirmed  Assessment/Plan:  1. CV - SR in the 80's. 2.  Pulmonary - Chest tubes with 90 cc of output last 24 hours. There is an air leak (+1 that worsens with cough). CXR appears to show right pneumothorax (stable), minor subcuteaneous emphysema right lateral chest wall.  Chest tubes to remain for now. On 2 liters of oxygen via [place]. Check CXR in am. 3. At patient request, stop daily weights  ZIMMERMAN,[name] 08/09/2016,7:19 AM  Will increase ct suction to 20  Small air leak from both tubes so have left both in  I have seen and examined Kelby Fam and agree with the above assessment  and plan.  [name] MD Beeper [phone] Office [phone] 08/09/2016 1:35 PM

## 2016-08-09 NOTE — Progress Notes (Signed)
Spoke with Dr. Servando Snare concerning air leak.  Per verbal will reduce suction back to 10 cm. Payton Emerald, RN

## 2016-08-10 ENCOUNTER — Inpatient Hospital Stay (HOSPITAL_COMMUNITY): Payer: Managed Care, Other (non HMO)

## 2016-08-10 MED ORDER — KETOROLAC TROMETHAMINE 30 MG/ML IJ SOLN
INTRAMUSCULAR | Status: AC
  Filled 2016-08-10: qty 1

## 2016-08-10 MED ORDER — KETOROLAC TROMETHAMINE 30 MG/ML IJ SOLN
30.0000 mg | Freq: Once | INTRAMUSCULAR | Status: AC
  Administered 2016-08-10: 30 mg via INTRAVENOUS

## 2016-08-10 NOTE — Progress Notes (Addendum)
      [address]       [place] [address]             [phone]       6 Days Post-Op Procedure(s) (LRB): RIGHT VIDEO ASSISTED THORACOSCOPY WITH SUTURE CLOSURE OF AIR LEAK (Right)  Subjective: Patient is in a lot of pain (chest tube sites and right chest) this am.  Objective: Vital signs in last 24 hours: Temp:  [97.8 F (36.6 C)-98.2 F (36.8 C)] 97.8 F (36.6 C) (02/07 0441) Pulse Rate:  [71-85] 71 (02/07 0441) Cardiac Rhythm: Normal sinus rhythm (02/07 0441) Resp:  [18-24] 18 (02/07 0441) BP: (110-117)/(54-66) 110/64 (02/07 0441) SpO2:  [97 %-98 %] 97 % (02/07 0441) Weight:  [146 lb 8 oz (66.5 kg)] 146 lb 8 oz (66.5 kg) (02/07 0507)     Intake/Output from previous day: 02/06 0701 - 02/07 0700 In: 740 [P.O.:740] Out: 1845 [Urine:1785; Chest Tube:60]   Physical Exam:  Cardiovascular: RRR Pulmonary: Clear on the left, diminished right apex Wounds: Dressing is clean and dry.  . Chest Tubes: to suction, air leak that worsens with cough  Lab Results: CBC:  Recent Labs  08/08/16 0213 08/09/16 0846  WBC 8.4 10.6*  HGB 11.1* 12.6*  HCT 33.9* 38.8*  PLT 368 388   BMET:   Recent Labs  08/08/16 0213 08/09/16 0846  NA 135 136  K 3.9 3.8  CL 99* 94*  CO2 28 29  GLUCOSE 104* 110*  BUN 8 8  CREATININE 0.58* 0.76  CALCIUM 8.7* 9.3    PT/INR: No results for input(s): LABPROT, INR in the last 72 hours. ABG:  INR: Will add last result for INR, ABG once components are confirmed Will add last 4 CBG results once components are confirmed  Assessment/Plan:  1. CV - SR in the 80's. 2.  Pulmonary - Chest tubes with 60 cc of output last 24 hours. Chest tubes are to suction (20 cm). There is an air leak that worsens with cough. CXR not taken yet this am. Chest tubes to remain for now. On 2 liters of oxygen via [place]. Check CXR in am. 3. Toradol this am to help with pain  ZIMMERMAN,[name] 08/10/2016,7:42 AM   I have seen and examined  [name] and agree with the above assessment  and plan.  [name] MD Beeper [phone] Office [phone] 08/11/2016 9:18 AM

## 2016-08-11 ENCOUNTER — Inpatient Hospital Stay (HOSPITAL_COMMUNITY): Payer: Managed Care, Other (non HMO)

## 2016-08-11 MED ORDER — ACETAMINOPHEN 325 MG PO TABS
650.0000 mg | ORAL_TABLET | Freq: Four times a day (QID) | ORAL | Status: DC | PRN
  Administered 2016-08-11 – 2016-08-12 (×4): 650 mg via ORAL
  Filled 2016-08-11 (×4): qty 2

## 2016-08-11 NOTE — Progress Notes (Addendum)
      [address]       [place] [address]             [phone]       7 Days Post-Op Procedure(s) (LRB): RIGHT VIDEO ASSISTED THORACOSCOPY WITH SUTURE CLOSURE OF AIR LEAK (Right)  Subjective: Patient states Toradol did help with pain yesterday am. His affect is flat this am-frustrated.  Objective: Vital signs in last 24 hours: Temp:  [98 F (36.7 C)-98.7 F (37.1 C)] 98.3 F (36.8 C) (02/08 0535) Pulse Rate:  [68-78] 68 (02/08 0535) Cardiac Rhythm: Normal sinus rhythm;Bundle branch block (02/07 1900) Resp:  [16-20] 18 (02/08 0535) BP: (98-109)/(60-65) 107/65 (02/08 0535) SpO2:  [94 %-99 %] 94 % (02/08 0535) Weight:  [152 lb 5.4 oz (69.1 kg)] 152 lb 5.4 oz (69.1 kg) (02/08 0535)     Intake/Output from previous day: 02/07 0701 - 02/08 0700 In: -  Out: 1140 [Urine:1060; Chest Tube:80]   Physical Exam:  Cardiovascular: RRR Pulmonary: Clear on the left, diminished right apex Wounds: Dressing is clean and dry.  . Chest Tubes: to suction, air leak that worsens with cough  Lab Results: CBC:  Recent Labs  08/09/16 0846  WBC 10.6*  HGB 12.6*  HCT 38.8*  PLT 388   BMET:   Recent Labs  08/09/16 0846  NA 136  K 3.8  CL 94*  CO2 29  GLUCOSE 110*  BUN 8  CREATININE 0.76  CALCIUM 9.3    PT/INR: No results for input(s): LABPROT, INR in the last 72 hours. ABG:  INR: Will add last result for INR, ABG once components are confirmed Will add last 4 CBG results once components are confirmed  Assessment/Plan:  1. CV - SR in the 80's. 2.  Pulmonary - Chest tubes with 80 cc of output last 24 hours. Chest tubes are to suction (20 cm). There is an air leak that worsens with cough. CXR this am appears to show small,stable right apical pneumothorax, minor right lateral chest wall subcutaneous emphysema). Chest tubes to remain for now. On room air this am. Check CXR in am.   ZIMMERMAN,[name] 08/11/2016,7:39 AM   Leak decreased and lung  better inflated , will d/c posterior chest tube  I have seen and examined [name] and agree with the above assessment  and plan.  [name] MD Beeper [phone] Office [phone] 08/11/2016 9:19 AM

## 2016-08-11 NOTE — Progress Notes (Signed)
Removed posterior chest tube per MD order. Anterior chest tube clamped during removal. Placed back to suction.  Patient tolerated well. Site closed with suture that was in place. Vaseline gauze placed at site.  Cyndia Bent RN

## 2016-08-12 ENCOUNTER — Inpatient Hospital Stay (HOSPITAL_COMMUNITY): Payer: Managed Care, Other (non HMO)

## 2016-08-12 MED ORDER — TRAMADOL HCL 50 MG PO TABS
50.0000 mg | ORAL_TABLET | Freq: Four times a day (QID) | ORAL | Status: DC | PRN
  Administered 2016-08-12: 50 mg via ORAL
  Filled 2016-08-12: qty 1

## 2016-08-12 MED ORDER — OXYCODONE HCL 5 MG PO TABS
5.0000 mg | ORAL_TABLET | ORAL | Status: DC | PRN
  Administered 2016-08-12: 5 mg via ORAL
  Administered 2016-08-12 – 2016-08-13 (×2): 10 mg via ORAL
  Filled 2016-08-12 (×3): qty 2

## 2016-08-12 NOTE — Discharge Summary (Signed)
Physician Discharge Summary       [address]       [place] [address]             [phone]    Patient ID: [name] MRN: [id number] DOB/AGE: 1950-10-16 66 y.o.  Admit date: 07/19/2016 Discharge date: 08/15/2016  Admission Diagnoses: Right upper lobe mass  Active Diagnoses:  1. Adenocarcinoma of right lung 2. Tobacco abuse 3. COPD (chronic obstructive pulmonary disease) ([place]) 4. 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 5. Arthritis   Procedure (s):  1. Video bronchoscopy, right video-assisted thoracoscopy, mini thoracotomy, wedge resection of right upper lobe and superior segment of right lower lobe with completion right upper lobectomy, lymph node dissection, and placement of On-Q device by Dr. Servando Snare on 07/19/2016. 2. Bronchoscopy and placement of Spiration intrabronchial valves x3 in the superior segment of the right lower lobe, in the medial and lateral branches of the right middle lobe by Dr. Servando Snare on 08/01/2016.  3. Reoperation with right video-assisted thoracoscopy for persistent air leak with oversewing of the right lower lobe air leak by Dr. Servando Snare on 08/04/2016.  Pathology: Lung, wedge biopsy/resection, Right Upper - INVASIVE ADENOCARCINOMA, WELL DIFFERENTIATED, SPANNING 2.5 CM. - THE SURGICAL RESECTION MARGIN IS NEGATIVE FOR CARCINOMA. - SEE ONCOLOGY TABLE BELOW. 2. Lymph node, biopsy, 4R #1 - THERE IS NO EVIDENCE OF CARCINOMA IN 1 OF 1 LYMPH NODE (0/1). 3. Lymph node, biopsy, 4R #2 - THERE IS NO EVIDENCE OF CARCINOMA IN 1 OF 1 LYMPH NODE (0/1). 4. Lymph node, biopsy, 10R #1 - THERE IS NO EVIDENCE OF CARCINOMA IN 7 OF 7 LYMPH NODES (0/7). 5. Lymph node, biopsy, 11R #1 - THERE IS NO EVIDENCE OF CARCINOMA IN 1 OF 1 LYMPH NODE (0/1). 6. Lymph node, biopsy, 12R #1 - THERE IS NO EVIDENCE OF CARCINOMA IN 1 OF 1 LYMPH NODE (0/1). 7. Lung, resection (segmental or lobe), Right Upper Lobe - BENIGN LUNG PARENCHYMA WITH  ANTHRACOSIS AND INTRA-ALVEOLAR PIGMENT LADEN MACROPHAGES. - CALCIFIED PLEURAL PLAQUE. - THERE IS NO EVIDENCE OF CARCINOMA IN 2 OF 2 LYMPH NODES (0/2). 8. Lymph node, biopsy, 2R #1 - THERE IS NO EVIDENCE OF CARCINOMA IN 1 OF 1 LYMPH NODE (0/1). REASON FOR ADDENDUM, AMENDMENT OR CORRECTION: [id number]: AMENDMENT NOTE: Previously reported as: PART 4: THERE IS NO EVIDENCE OF CARCINOMA IN 1 OF 1 LYMPH NODE (0/1). Should actually be seven individual lymph nodes and not one. ATTENTION: Revised diagnosis report.  TNM code: pT1b, pN0  History of Presenting Illness: [name] 66 y.o. male is seen in the office for right lung mass. He is long term smoker1-2  ppd for 40 years. He presented to urgent care with increased cough and fever in December.  He was treated for copd excerebration and pneumonia. He noted improvement with antibiotics, inhalers and steroids.Follow up  up chest xray showed right lung mass. Follow ct and PET scan done in [place] ( under name [name]) suggestive of 2.5 cm right upper lobe hypermetabolic   mass highly suggestive of primary lung cancer. He is referred for consideration of surgical resection. Dr. Servando Snare reviewed with the patient and wife, treatment options for what is clinical stage 1 lung cancer of the right upper lobe. He has cut smoking back to 5 cig a day. He is agreeable with recommendation to proceed with surgical resection of his right lung lesion for dx and rx. He was admitted on 07/19/2016 for bronchoscopy, wedge RUL followed by RUL, superior segmentectomy  of RLL, lymph node dissection, and On Q placement.  Brief Hospital Course:  He has had a lengthy post operative course that was complicated by a persistent air leak. Because of this air leak, he had to undergo another bronchoscopy, placement of intrabronchial valves x 3 in the RLL in the medial and lateral branches of RML. Again, because of a persistent post operative air leak, he then underwent another right VATS  with over sew of the RLL air leak. He eventually was able to have one chest tube removed on 08/11/2016. The remaining chest tube was placed to water seal on 02/09., his chest tube will be placed to a mini express. This was done on 08/14/2016.  Follow up CXR showed  on 08/15/2016 shows, Chest tube unchanged on the right. Postoperative change on the right with small hydropneumothorax. Pneumothorax is seen in the right apex and right base regions without tension component. Left lung clear. Stable cardiac silhouette. There is aortic atherosclerosismains stable. He is ambulating on room air. He has been tolerating a diet and has had multiple bowel movements   Latest Vital Signs: Blood pressure 111/61, pulse 86, temperature 97.7 F (36.5 C), temperature source Oral, resp. rate 20, height 6' (1.829 m), weight 151 lb 8 oz (68.7 kg), SpO2 94 %.  Physical Exam: Cardiovascular: RRR Pulmonary: Clear on the left, slightly diminished right apex Wounds: Dressing is clean and dry.  Chest Tubes: to suction, air leak that worsens with cough  Discharge Condition:Stable and discharged to home.  Recent laboratory studies:  Lab Results  Component Value Date   WBC 10.6 (H) 08/09/2016   HGB 12.6 (L) 08/09/2016   HCT 38.8 (L) 08/09/2016   MCV 92.2 08/09/2016   PLT 388 08/09/2016   Lab Results  Component Value Date   NA 136 08/09/2016   K 3.8 08/09/2016   CL 94 (L) 08/09/2016   CO2 29 08/09/2016   CREATININE 0.76 08/09/2016   GLUCOSE 110 (H) 08/09/2016   Diagnostic Studies:  Dg Chest Port 1 View  Result Date: 08/12/2016 CLINICAL DATA:  Chest tube EXAM: PORTABLE CHEST 1 VIEW COMPARISON:  August 11, 2016 FINDINGS: The chest tube may have been pulled back slightly in the interval. The right apical pneumothorax measures 16 mm, not significantly changed in the interval. No left-sided pneumothorax. The left lung is clear. The cardiomediastinal silhouette is unchanged. Postsurgical changes on the right are  unchanged. No acute interval change. IMPRESSION: 1. Stable right chest tube, right postsurgical changes, and small right apical pneumothorax. Electronically Signed   By: Dorise Bullion III M.D   On: 08/12/2016 08:42   Discharge Medications: Allergies as of 08/15/2016      Reactions   No Known Allergies       Medication List    TAKE these medications   acetaminophen 325 MG tablet Commonly known as:  TYLENOL Take 2 tablets (650 mg total) by mouth every 6 (six) hours as needed for mild pain.   albuterol 108 (90 Base) MCG/ACT inhaler Commonly known as:  PROVENTIL HFA;VENTOLIN HFA Inhale 2 puffs into the lungs every 6 (six) hours as needed for wheezing or shortness of breath.   budesonide-formoterol 160-4.5 MCG/ACT inhaler Commonly known as:  SYMBICORT Inhale 2 puffs into the lungs 2 (two) times daily.   guaiFENesin 600 MG 12 hr tablet Commonly known as:  MUCINEX Take 1 tablet (600 mg total) by mouth 2 (two) times daily as needed for cough or to loosen phlegm.   oxyCODONE 5 MG  immediate release tablet Commonly known as:  Oxy IR/ROXICODONE Take 5 mg by mouth every 4-6 hours PRN severe pain   SPIRIVA RESPIMAT 2.5 MCG/ACT Aers Generic drug:  Tiotropium Bromide Monohydrate Inhale 2 puffs into the lungs 2 (two) times daily.   tamsulosin 0.4 MG Caps capsule Commonly known as:  FLOMAX Take 1 capsule (0.4 mg total) by mouth daily after breakfast.       Follow Up Appointments: Follow-up Information    [name], MD Follow up on 08/25/2016.   Specialty:  Cardiothoracic Surgery Why:  PA/LAT CXR to be taken (at [facility] which is in the same building as Dr. Louanna Raw office) on 08/25/2016 at 12:30 pm;Appointment time is at 1:00 pm Contact information: [address] [place] [address] [phone]        Home Health Follow up.   Why:  Please change right chest tube dressing at least every other day (dry 4x4s with tape). Chest tube is to remain to  mini express       [facility] Follow up.   Specialty:  Home Health Services Why:  [facility] arranged for Chest Tube care Contact information: [place] [address] [phone]           Signed: Macy Mis 08/15/2016, 1:09 PM

## 2016-08-12 NOTE — Progress Notes (Addendum)
      [address]       [place] [address]             [phone]       8 Days Post-Op Procedure(s) (LRB): RIGHT VIDEO ASSISTED THORACOSCOPY WITH SUTURE CLOSURE OF AIR LEAK (Right)  Subjective: Patient still with a fair amount of pain at chest tube site.  Objective: Vital signs in last 24 hours: Temp:  [98 F (36.7 C)-98.5 F (36.9 C)] 98.5 F (36.9 C) (02/09 0555) Pulse Rate:  [64-83] 64 (02/09 0555) Cardiac Rhythm: Normal sinus rhythm (02/09 0751) Resp:  [18-20] 18 (02/09 0555) BP: (100-116)/(46-64) 108/60 (02/09 0555) SpO2:  [94 %-99 %] 96 % (02/09 0727) Weight:  [151 lb 8 oz (68.7 kg)] 151 lb 8 oz (68.7 kg) (02/09 0555)     Intake/Output from previous day: 02/08 0701 - 02/09 0700 In: 240 [P.O.:240] Out: 285 [Urine:275; Chest Tube:10]   Physical Exam:  Cardiovascular: RRR Pulmonary: Clear on the left, slightly diminished right apex Wounds: Dressing is clean and dry.  Chest Tubes: to suction, air leak that worsens with cough  Lab Results: CBC:  Recent Labs  08/09/16 0846  WBC 10.6*  HGB 12.6*  HCT 38.8*  PLT 388   BMET:   Recent Labs  08/09/16 0846  NA 136  K 3.8  CL 94*  CO2 29  GLUCOSE 110*  BUN 8  CREATININE 0.76  CALCIUM 9.3    PT/INR: No results for input(s): LABPROT, INR in the last 72 hours. ABG:  INR: Will add last result for INR, ABG once components are confirmed Will add last 4 CBG results once components are confirmed  Assessment/Plan:  1. CV - SR in the 80's. 2.  Pulmonary - Posterior chest tube removed yesterday. Remaining chest tube with 10 cc of output last 24 hours. Chest tube is to suction (20 cm). There is an air leak that worsens with cough. CXR this am appears to show small,stable right apical pneumothorax, minor right lateral chest wall subcutaneous emphysema). On room air this am. As discussed with Dr. Servando Snare, will place chest tube to water seal. Check CXR in am. Will likely be discharged with  chest tube to a mini express. 3. Regarding pain control, will change Oxy to every 3 hours PRN severe pain and add Ultram 50 mg every 6 hours PRN.  ZIMMERMAN,[name] 08/12/2016,8:05 AM  Chest to water seal today, try mini express  pca pump off  Poss home Monday with chest tube in place I have seen and examined [name] and agree with the above assessment  and plan.  [name] MD [facility] [phone] Office [phone] 08/12/2016 12:14 PM

## 2016-08-13 ENCOUNTER — Inpatient Hospital Stay (HOSPITAL_COMMUNITY): Payer: Managed Care, Other (non HMO)

## 2016-08-13 NOTE — Progress Notes (Addendum)
      [address]       [place] [address]             [phone]      9 Days Post-Op Procedure(s) (LRB): RIGHT VIDEO ASSISTED THORACOSCOPY WITH SUTURE CLOSURE OF AIR LEAK (Right)   Subjective:  Mr. Seefeldt has no new complaints. States his pain is better today.  He was hopeful he was going to get to go home soon.  Objective: Vital signs in last 24 hours: Temp:  [98 F (36.7 C)-98.5 F (36.9 C)] 98.3 F (36.8 C) (02/10 0555) Pulse Rate:  [73-91] 73 (02/10 0555) Cardiac Rhythm: Normal sinus rhythm (02/10 0745) Resp:  [18] 18 (02/10 0555) BP: (100-113)/(60-62) 100/60 (02/10 0555) SpO2:  [95 %-96 %] 96 % (02/10 0555)  Intake/Output from previous day: 02/09 0701 - 02/10 0700 In: 960 [P.O.:960] Out: 1210 [Urine:1200; Chest Tube:10]  General appearance: alert, cooperative and no distress Heart: regular rate and rhythm Lungs: clear to auscultation bilaterally and diminished right apex Abdomen: soft, non-tender; bowel sounds normal; no masses,  no organomegaly Extremities: extremities normal, atraumatic, no cyanosis or edema Wound: clean and dry  Lab Results: No results for input(s): WBC, HGB, HCT, PLT in the last 72 hours. BMET: No results for input(s): NA, K, CL, CO2, GLUCOSE, BUN, CREATININE, CALCIUM in the last 72 hours.  PT/INR: No results for input(s): LABPROT, INR in the last 72 hours. ABG    Component Value Date/Time   PHART 7.391 08/05/2016 0417   HCO3 21.4 08/05/2016 0417   TCO2 22 08/05/2016 0417   ACIDBASEDEF 3.0 (H) 08/05/2016 0417   O2SAT 98.0 08/05/2016 0417   CBG (last 3)  No results for input(s): GLUCAP in the last 72 hours.  Assessment/Plan: S/P Procedure(s) (LRB): RIGHT VIDEO ASSISTED THORACOSCOPY WITH SUTURE CLOSURE OF AIR LEAK (Right)  1. Chest tube- placed to water seal yesterday, 5+ air leak with cough.. CXR shows increase in previous pneumothorax---- will not place to MINI Express today 2. CV- hemodynamically stabler 3.  Pain control- stable, continue Oxy 4. Dispo- increase in pneumothorax after chest tube placed to water seal.. Not ready for Mini Express, leave chest tube to water seal for now repeat CXR in AM   LOS: 25 days    BARRETT, ERIN 08/13/2016 Patient seen and examined As noted above CXr shows a larger pneumo today on water seal- tolerating well. Will leave on water seal and repeat CXR in AM  Joslynne Klatt C. Roxan Hockey, MD [facility] [phone]

## 2016-08-14 ENCOUNTER — Inpatient Hospital Stay (HOSPITAL_COMMUNITY): Payer: Managed Care, Other (non HMO)

## 2016-08-14 MED ORDER — ZOLPIDEM TARTRATE 5 MG PO TABS
5.0000 mg | ORAL_TABLET | Freq: Every evening | ORAL | Status: DC | PRN
  Administered 2016-08-14: 5 mg via ORAL
  Filled 2016-08-14: qty 1

## 2016-08-14 NOTE — Progress Notes (Addendum)
      [address]       [place] [address]             [phone]      10 Days Post-Op Procedure(s) (LRB): RIGHT VIDEO ASSISTED THORACOSCOPY WITH SUTURE CLOSURE OF AIR LEAK (Right)   Subjective:  Mr. Mcniel is very tired.  States he is not sleeping much at all and wants something for sleep at night.  He is also very frustrated with his situation and is upset that he is not progressing.  Objective: Vital signs in last 24 hours: Temp:  [98 F (36.7 C)-98.4 F (36.9 C)] 98.3 F (36.8 C) (02/11 0536) Pulse Rate:  [76-84] 76 (02/11 0536) Cardiac Rhythm: Normal sinus rhythm (02/11 0726) Resp:  [18-20] 18 (02/11 0536) BP: (101-115)/(56-70) 115/70 (02/11 0536) SpO2:  [94 %-97 %] 96 % (02/11 0536)  Intake/Output from previous day: 02/10 0701 - 02/11 0700 In: -  Out: 10 [Chest Tube:10] Intake/Output this shift: Total I/O In: 240 [P.O.:240] Out: 0   General appearance: alert, cooperative and no distress Heart: regular rate and rhythm Lungs: clear to auscultation bilaterally Abdomen: soft, non-tender; bowel sounds normal; no masses,  no organomegaly Extremities: extremities normal, atraumatic, no cyanosis or edema Wound: clean and dry  Lab Results: No results for input(s): WBC, HGB, HCT, PLT in the last 72 hours. BMET: No results for input(s): NA, K, CL, CO2, GLUCOSE, BUN, CREATININE, CALCIUM in the last 72 hours.  PT/INR: No results for input(s): LABPROT, INR in the last 72 hours. ABG    Component Value Date/Time   PHART 7.391 08/05/2016 0417   HCO3 21.4 08/05/2016 0417   TCO2 22 08/05/2016 0417   ACIDBASEDEF 3.0 (H) 08/05/2016 0417   O2SAT 98.0 08/05/2016 0417   CBG (last 3)  No results for input(s): GLUCAP in the last 72 hours.  Assessment/Plan: S/P Procedure(s) (LRB): RIGHT VIDEO ASSISTED THORACOSCOPY WITH SUTURE CLOSURE OF AIR LEAK (Right)  1. CV- NSR with Sinus Pause- recovers without difficulty, not on BB will monitor 2. Chest tube- 5+ air  leak with cough, CXR shows some mild improvement in pneumothorax.... Will discuss possibly connecting patient to Mini Express today 3. Pain control- Oxy prn 4. Insomnia- Ambien prn 5. Dispo- patient stable, developed Sinus Pauses overnight while sleeping, will monitor, CXR shows improvement in pnuemothorax... Continued air leak will discuss possibly connecting chest tube to Mini Express today   LOS: 26 days    BARRETT, ERIN 08/14/2016 CXR stable to slightly improved Change to mini-xpress. Home tomorrow with Ct if CR ok  Remo Lipps C. Roxan Hockey, MD [facility] [phone]

## 2016-08-15 ENCOUNTER — Inpatient Hospital Stay (HOSPITAL_COMMUNITY): Payer: Managed Care, Other (non HMO)

## 2016-08-15 MED ORDER — ACETAMINOPHEN 325 MG PO TABS: 650.0000 mg | ORAL_TABLET | Freq: Four times a day (QID) | ORAL | Status: DC | PRN

## 2016-08-15 MED ORDER — GUAIFENESIN ER 600 MG PO TB12: 600.0000 mg | ORAL_TABLET | Freq: Two times a day (BID) | ORAL | Status: DC | PRN

## 2016-08-15 MED ORDER — TAMSULOSIN HCL 0.4 MG PO CAPS: 0.4000 mg | ORAL_CAPSULE | Freq: Every day | ORAL | 1 refills | Status: DC

## 2016-08-15 MED ORDER — OXYCODONE HCL 5 MG PO TABS: ORAL_TABLET | ORAL | 0 refills | Status: DC

## 2016-08-15 NOTE — Discharge Instructions (Signed)
Please use dry 4x4s with tape to right chest tube site. Change at least every other day.  Thoracotomy, Care After This sheet gives you information about how to care for yourself after your procedure. Your health care provider may also give you more specific instructions. If you have problems or questions, contact your health care provider. What can I expect after the procedure? After your procedure, it is common to have:  Pain and swelling around the incision area.  Pain when you breathe in (inhale).  Constipation.  Fatigue.  Loss of appetite.  Trouble sleeping.  Mood swings and depression. Follow these instructions at home: Preventing pneumonia  Take deep breaths or do breathing exercises as instructed by your health care provider.  Cough frequently. Coughing may cause discomfort, but it is important to clear mucus (phlegm) and expand your lungs. If coughing hurts, hold a pillow against your chest or place both hands flat on top of the incision (splinting) when you cough. This may help relieve discomfort.  Continue to use an incentive spirometer as directed. This is a tool that measures how well you fill your lungs with each breath.  Participate in pulmonary rehabilitation as directed. This is a program that combines education, exercise, and support from a team of specialists. The goal is to help you heal and return to normal activities as soon as possible. Medicines  Take over-the-counter or prescription medicines only as told by your health care provider.  If you have pain, take pain-relieving medicine before your pain becomes severe. This is important because if your pain is under control, you will be able to breathe and cough more comfortably.  If you were prescribed an antibiotic medicine, take it as told by your health care provider. Do not stop taking the antibiotic even if you start to feel better. Activity  Ask your health care provider what activities are safe for  you.  Do not travel by airplane for 2 weeks after your chest tube is removed, or until your health care provider says that this is safe.  Do not lift anything that is heavier than 10 lb (4.5 kg), or the limit that your health care provider tells you, until he or she says that it is safe.  Do not drive until your health care provider approves.  Do not drive or use heavy machinery while taking prescription pain medicine. Incision care  Follow instructions from your health care provider about how to take care of your incision. Make sure you:  Wash your hands with soap and water before you change your bandage (dressing). If soap and water are not available, use hand sanitizer.  Change your dressing as told by your health care provider.  Leave stitches (sutures), skin glue, or adhesive strips in place. These skin closures may need to stay in place for 2 weeks or longer. If adhesive strip edges start to loosen and curl up, you may trim the loose edges. Do not remove adhesive strips completely unless your health care provider tells you to do that.  Keep your dressing dry.  Check your incision area every day for signs of infection. Check for:  More redness, swelling, or pain.  More fluid or blood.  Warmth.  Pus or a bad smell. Bathing  Do not take baths, swim, or use a hot tub until your health care provider approves. You may take showers.  After your dressing has been removed, use soap and water to gently wash your incision area. Do not use anything  else to clean your incision unless your health care provider tells you to do that. Eating and drinking  Eat a healthy diet as instructed by your health care provider. A healthy diet includes plenty of fresh fruits and vegetables, whole grains, and low-fat (lean) proteins.  Drink enough fluid to keep your urine clear or pale yellow. General instructions  To prevent or treat constipation while you are taking prescription pain medicine,  your health care provider may recommend that you:  Take over-the-counter or prescription medicines.  Eat foods that are high in fiber, such as fresh fruits and vegetables, whole grains, and beans.  Limit foods that are high in fat and processed sugars, such as fried and sweet foods.  Do not use any products that contain nicotine or tobacco, such as cigarettes and e-cigarettes. If you need help quitting, ask your health care provider.  Avoid secondhand smoke.  Wear compression stockings as told by your health care provider. These stockings help to prevent blood clots and reduce swelling in your legs.  If you have a chest tube, care for it as instructed.  Keep all follow-up visits as told by your health care provider. This is important. Contact a health care provider if:  You have more redness, swelling, or pain around your incision.  You have more fluid or blood coming from your incision.  Your incision feels warm to the touch.  You have pus or a bad smell coming from your incision.  You have a fever or chills.  Your heartbeat seems irregular.  You have nausea or vomiting.  You have muscle aches.  You are constipated. This may mean that you have:  Fewer bowel movements in a week than normal.  Difficulty having a bowel movement.  Stools that are dry, hard, or larger than normal. Get help right away if:  You develop a rash.  You feel light-headed or feel like you are going to faint.  You have shortness of breath or trouble breathing.  You are confused.  You have trouble speaking.  You have vision problems.  You are not able to move.  You have numbness in your face, arms, or legs.  You lose consciousness.  You have a sudden, severe headache.  You feel weak.  You have chest pain.  You have pain that:  Is severe.  Gets worse, even with medicine. Summary  To prevent pneumonia, take deep breaths, do breathing exercises, and cough frequently, as  instructed by your health care provider.  Do not drive until your health care provider approves. Do not travel by airplane for 2 weeks after your chest tube is removed, or until your health care provider approves.  Check your incision area every day for signs of infection.  Eat a healthy diet that includes plenty of fresh fruits and vegetables, whole grains, and low-fat (lean) proteins. This information is not intended to replace advice given to you by your health care provider. Make sure you discuss any questions you have with your health care provider. Document Released: 12/03/2010 Document Revised: 03/14/2016 Document Reviewed: 03/14/2016 Elsevier Interactive Patient Education  2017 [employer].

## 2016-08-15 NOTE — Care Management Note (Addendum)
Case Management Note Previous CM note initiated by Maryclare Labrador, RN 08/05/2016, 10:17 AM   Patient Details  Name: [name] MRN: [id number] Date of Birth: [birth year]  Subjective/Objective: S/p VATS, wedge resection and lobectomy for suspected Lung CA               Action/Plan:   PTA independent from home with wife.  CM will continue to follow for discharge needs   Expected Discharge Date:  08/15/16        Expected Discharge Plan:  [place]  In-House Referral:     Discharge planning Services  CM Consult  Post Acute Care Choice:  Home Health Choice offered to:  Patient  DME Arranged:    DME Agency:     HH Arranged:  RN [place] Agency:   [facility] [place]  Status of Service:  Completed, signed off  If discussed at [place] of Stay Meetings, dates discussed:  2/6  Discharge Disposition: home/home health   Additional Comments:  Post Discharge Note- 08/17/16-1138- received call from [place] with [facility]- they have secured [facility] arrangements with [place] for [facility] - per [place],  [facility] will be starting care within the next 24 hr- Tiffany will contact the pt and inform him of [facility] agency that he has been contracted with for [facility] services  08/16/16-1235- received call post discharge- from [employer]- that they would not be able to service pt for [facility] services after accepting referral yesterday. Pt has [employer]- have called [facility] who is [employer] outside contracting provider- spoke with Andee Poles who took the [facility] request- and intake- [facility] will find a [place] agency to contract with and contact pt with name of company- intake # is [phone]- have faxed needed order to [facility] via epic to [phone].   08/15/16- [place] RN, CM- pt for d/c home today with Chest Tube to mini express- [facility] order for CT management. Spoke with pt at bedside- choice offered for [facility] agency- per pt he does not have a preference  selected [place]- referral called to Santiago [place] with [facility]- however they can not offer nursing services due to pt's insurance not being in-network- also reached out to [place] but they are also not in network- pt agreeable to any [facility] agency as long as they are in-network- reached out to [facility]- who states that they can accept the referral- referral given to [place]- with [facility] [place]- informed pt at bedside of referral to [facility]- pt agreeable.     Maryclare Labrador, RN 08/05/2016, 10:17 AM--Discussed in LOS 08/04/16 - pt remains appropriate for continued stay.  Pt is now s/p 2nd VATS procedure  07/26/16 Discussed in LOS 07/26/16 - remains appropriate for continued stay   Dawayne Patricia, RN 08/15/2016, 11:39 AM  [phone]

## 2016-08-15 NOTE — Progress Notes (Addendum)
      [address]       [place] [address]             [phone]       11 Days Post-Op Procedure(s) (LRB): RIGHT VIDEO ASSISTED THORACOSCOPY WITH SUTURE CLOSURE OF AIR LEAK (Right)  Subjective: Patient able to sleep with Ambien. He hopes to go home.  Objective: Vital signs in last 24 hours: Temp:  [97.7 F (36.5 C)-98.1 F (36.7 C)] 97.7 F (36.5 C) (02/12 0509) Pulse Rate:  [74-86] 86 (02/12 0509) Cardiac Rhythm: Sinus bradycardia (02/12 0500) Resp:  [18-20] 20 (02/12 0509) BP: (100-111)/(61-69) 111/61 (02/12 0509) SpO2:  [95 %-97 %] 96 % (02/12 0509)     Intake/Output from previous day: 02/11 0701 - 02/12 0700 In: 480 [P.O.:480] Out: 0    Physical Exam:  Cardiovascular: RRR Pulmonary: Clear on the left, slightly diminished right apex Wounds: Dressing is clean and dry.  Chest Tube: to mini express  Lab Results: CBC: No results for input(s): WBC, HGB, HCT, PLT in the last 72 hours. BMET:  No results for input(s): NA, K, CL, CO2, GLUCOSE, BUN, CREATININE, CALCIUM in the last 72 hours.  PT/INR: No results for input(s): LABPROT, INR in the last 72 hours. ABG:  INR: Will add last result for INR, ABG once components are confirmed Will add last 4 CBG results once components are confirmed  Assessment/Plan:  1. CV - Has had bradycardic at times and previous pauses. SR in the 80's this am. 2.  Pulmonary - Chest tube is to mini express.  CXR this am appears to show a stable right apical and a new small right base pneumothorax . Encourage incentive spirometer. 3. Hope to discharge with chest tube to mini express. Will arrange HH  ZIMMERMAN,[name] 08/15/2016,7:19 AM Patient seen and examined, agree with above CXR stable with CT to mini-xpress Dc home, follow up with [facility]BG next week  Remo Lipps C. Roxan Hockey, MD [facility] [phone]

## 2016-08-15 NOTE — Progress Notes (Signed)
Discussed with the patient and wife and all questioned fully answered. He will call me if any problems arise. Pt and wife verbalized understanding of all discharge instructions.   Pt given paper prescriptions for oxycodone and flomax.   Per verbal order from Lars Pinks removed 2 surface sutures below chest tube from previous chest tube insertion site. Covered with benzoin and steri strips.   Fritz Pickerel, RN

## 2016-08-16 NOTE — Progress Notes (Signed)
08/16/16-1235- received call post discharge- from [place]- that they would not be able to service pt for [facility] services after accepting referral yesterday. Pt has [employer]- have called [facility] who is [employer] outside contracting provider- spoke with Andee Poles who took the [facility] request- and intake- [facility] will find a [place] agency to contract with and contact pt with name of company- intake # is [id number]- have faxed needed order to [facility] via epic to [phone].

## 2016-08-17 ENCOUNTER — Telehealth: Payer: Self-pay | Admitting: Physician Assistant

## 2016-08-17 NOTE — Progress Notes (Signed)
Post Discharge Note- 08/17/16-1138- received call from [place] with [facility]- they have secured [facility] arrangements with [place] for [facility] - per [place],  [facility] will be starting care within the next 24 hr- Tiffany will contact the pt and inform him of [facility] agency that he has been contracted with for [facility] services.

## 2016-08-17 NOTE — Progress Notes (Signed)
08/17/16- 1600- [place] from [facility] that they would not be taking referral from [place]- call made to [facility] - spoke with Tillie Rung- per conversation they are still working on securing pt an agency to provide [facility] services- have sent referral back to intake for continued need for [facility] set up. (this process has to go through the [place]) They will contact pt once agency has been found. Have called and spoken with pt's wife to explain situation- also gave wife [facility] # and pt's intake # [phone]) so that she can contact [facility] herself to f/u on where they are at in the process. Also provided wife with this CM contact # if needed.

## 2016-08-17 NOTE — Telephone Encounter (Signed)
      [address]       [place] [address]             [place] [phone]   S/P bronchoscopy, right mini thoracotomy for RUL superior segmentectomy of RLLperformed on 07/19/16, bronchoscopy, placement of Spiration intrabronchial valves x 3 on 08/01/16 for persistent air leak, and re operation with right VATS and oversew of RLL air leak on 08/04/16.Marland Kitchen  He was discharged home on 08/15/16 with his chest tube to a mini express.  Medications: Reviewed  Problems/Concerns:  Assessment:  Patient is doing ok.   He has occasional sharp pains at chest tube site. He states he is taking about 3 Oxycodone per day and he may need a refill next week. Contact office if concerns or problems develop  Follow up Appointment:  PA/LAT CXR to be taken (at [facility] which is in the same building as Dr. Louanna Raw office) on 08/25/2016 at 12:30 pm;Appointment time is at 1:00 pm

## 2016-08-18 ENCOUNTER — Encounter: Payer: Self-pay | Admitting: *Deleted

## 2016-08-18 NOTE — Progress Notes (Signed)
[name] came to the office this morning without an appointment. They are frustrated because a home health nurse has still not been assigned for [name]'s care since discharge on2/12/18. This has been due to his [employer] and care management being unable to fine a home health agency that accepts it. He is post op three thoracic surgeries in a very short span of time. He has a persistent air leak and had to have his chest tube connected to a mini-express on discharge. I reassured them that the care for this was not that involved and proceeded to teach them the procedure for draining it with a luer lock syringe. I removed the CT site dressing. It remained securely stitched with no signs of infection. I cleansed with saline and redressed it. [name] observed. I removed sutures from a previous CT/port site. I supplied them with necessary items for home care. I offered help prn. If home health is not secured, I feel it can be managed by his wife and our assistance if needed.

## 2016-08-19 NOTE — Care Management (Signed)
Post discharge- 08/19/16- 1640- received another call from [facility] at [facility]- was informed that she has not been able to secure a [facility] agency for pt and will not be able to contract for services with anyone- she reported that she has contacted the pt and informed him that they have been unable to find an agency to contract for [facility] services- pt will f/u with MD office.

## 2016-08-19 NOTE — Progress Notes (Signed)
Post Discharge Note- 08/19/16- received call from [facility] with [place] [phone] ext. [phone]) per conversation she has now been assigned the case to find a [place] agency due to the difficulty in finding the services since pt has been discharged - discussed [place] needs for chest tube management- she also reports that has spoken with pt- [facility] is still actively trying to find a [facility] agency- also made call to Abby Potash at Dr Servando Snare office and discussed the situation- pt did go by office 2/15- and had education with Abby Potash- plan will be for pt to use office as needed for chest tube care - if no [facility] agency can be found.

## 2016-08-22 ENCOUNTER — Other Ambulatory Visit: Payer: Self-pay | Admitting: *Deleted

## 2016-08-22 DIAGNOSIS — G8918 Other acute postprocedural pain: Secondary | ICD-10-CM

## 2016-08-22 MED ORDER — OXYCODONE HCL 5 MG PO TABS: ORAL_TABLET | ORAL | 0 refills | Status: DC

## 2016-08-24 ENCOUNTER — Other Ambulatory Visit: Payer: Self-pay | Admitting: Cardiothoracic Surgery

## 2016-08-24 DIAGNOSIS — C3490 Malignant neoplasm of unspecified part of unspecified bronchus or lung: Secondary | ICD-10-CM

## 2016-08-25 ENCOUNTER — Other Ambulatory Visit: Payer: Self-pay | Admitting: Cardiothoracic Surgery

## 2016-08-25 ENCOUNTER — Encounter: Payer: Self-pay | Admitting: Physician Assistant

## 2016-08-25 ENCOUNTER — Other Ambulatory Visit: Payer: Self-pay | Admitting: *Deleted

## 2016-08-25 ENCOUNTER — Ambulatory Visit
Admission: RE | Admit: 2016-08-25 | Discharge: 2016-08-25 | Disposition: A | Discharge status: Home / Self Care | Payer: Managed Care, Other (non HMO) | Source: Ambulatory Visit | Attending: Cardiothoracic Surgery | Admitting: Cardiothoracic Surgery

## 2016-08-25 ENCOUNTER — Ambulatory Visit (INDEPENDENT_AMBULATORY_CARE_PROVIDER_SITE_OTHER): Payer: Self-pay | Admitting: Physician Assistant

## 2016-08-25 VITALS — BP 122/77 | HR 75 | Resp 20 | Ht 72.0 in | Wt 146.0 lb

## 2016-08-25 DIAGNOSIS — Z8709 Personal history of other diseases of the respiratory system: Secondary | ICD-10-CM

## 2016-08-25 DIAGNOSIS — J95812 Postprocedural air leak: Secondary | ICD-10-CM

## 2016-08-25 DIAGNOSIS — Z902 Acquired absence of lung [part of]: Secondary | ICD-10-CM

## 2016-08-25 DIAGNOSIS — Z4682 Encounter for fitting and adjustment of non-vascular catheter: Secondary | ICD-10-CM

## 2016-08-25 DIAGNOSIS — D381 Neoplasm of uncertain behavior of trachea, bronchus and lung: Secondary | ICD-10-CM

## 2016-08-25 DIAGNOSIS — C3490 Malignant neoplasm of unspecified part of unspecified bronchus or lung: Secondary | ICD-10-CM

## 2016-08-25 NOTE — Progress Notes (Signed)
  HPI: 1. Video bronchoscopy, right video-assisted thoracoscopy, mini thoracotomy, wedge resection of right upper lobe and superior segment of right lower lobe with completion right upper lobectomy, lymph node dissection, and placement of On-Q device by Dr. Servando Snare on 07/19/2016. 2. Bronchoscopy and placement of Spiration intrabronchial valves x3 in the superior segment of the right lower lobe, in the medial and lateral branches of the right middle lobe by Dr. Servando Snare on 08/01/2016.  3. Reoperation with right video-assisted thoracoscopy for persistent air leak with oversewing of the right lower lobe air leak by Dr. Servando Snare on 08/04/2016.  Patient returns for follow up after the aforementioned surgeries. He was discharged in stable condition and his chest tube was placed to a mini express.    Current Outpatient Prescriptions  Medication Sig Dispense Refill  . acetaminophen (TYLENOL) 325 MG tablet Take 2 tablets (650 mg total) by mouth every 6 (six) hours as needed for mild pain.    Marland Kitchen albuterol (PROVENTIL HFA;VENTOLIN HFA) 108 (90 Base) MCG/ACT inhaler Inhale 2 puffs into the lungs every 6 (six) hours as needed for wheezing or shortness of breath.    . budesonide-formoterol (SYMBICORT) 160-4.5 MCG/ACT inhaler Inhale 2 puffs into the lungs 2 (two) times daily.    Marland Kitchen guaiFENesin (MUCINEX) 600 MG 12 hr tablet Take 1 tablet (600 mg total) by mouth 2 (two) times daily as needed for cough or to loosen phlegm.    Marland Kitchen oxyCODONE (OXY IR/ROXICODONE) 5 MG immediate release tablet Take 5 mg by mouth every 6-8hours PRN severe pain 30 tablet 0  . SPIRIVA RESPIMAT 2.5 MCG/ACT AERS Inhale 2 puffs into the lungs 2 (two) times daily.    . tamsulosin (FLOMAX) 0.4 MG CAPS capsule Take 1 capsule (0.4 mg total) by mouth daily after breakfast. 30 capsule 1  Vital Signs: BP 122/77, , HR 75,  RR  20, Oxygen saturation 97% on room air.   Physical Exam: CV-RRR Pulmonary-Clear to auscultation  bilaterally Wounds-Clean and dry  Diagnostic Tests: EXAM: CHEST  2 VIEW  COMPARISON:  08/15/2016  FINDINGS: Right chest tube remains in place. There is no longer any visible pleural air. Remaining pulmonary tissue as well aerated. No effusions. No pneumonia or collapse.  IMPRESSION: Good postoperative appearance. Right chest remains in place. No residual pleural air.   Electronically Signed   By: Nelson Chimes M.D.   On: 08/25/2016 13:01  Impression and Plan: Patient reports he has NOT smoked since returning home. He is gradually getting his strength back. There is NO air leak from chest tube. Dr. Servando Snare pulled chest tube. A PA/LAT CXR will be taken downstairs. Provided  The CXR is stable, he will go home. In 2 weeks, he will return with a PA/LAT CXR to see Dr. Servando Snare.   Nani Skillern, PA-C [facility] [phone]

## 2016-08-25 NOTE — Patient Instructions (Signed)
Continue to be smoke free.

## 2016-08-30 ENCOUNTER — Other Ambulatory Visit: Payer: Self-pay

## 2016-08-30 DIAGNOSIS — G8918 Other acute postprocedural pain: Secondary | ICD-10-CM

## 2016-08-30 MED ORDER — OXYCODONE HCL 5 MG PO TABS: ORAL_TABLET | ORAL | 0 refills | Status: DC

## 2016-08-30 NOTE — Telephone Encounter (Signed)
RX refill for OxyCodone 5 mg ok'ed by Dr Servando Snare. Printed out for pickup

## 2016-09-02 ENCOUNTER — Other Ambulatory Visit: Payer: Self-pay | Admitting: Cardiothoracic Surgery

## 2016-09-02 DIAGNOSIS — C3490 Malignant neoplasm of unspecified part of unspecified bronchus or lung: Secondary | ICD-10-CM

## 2016-09-08 ENCOUNTER — Encounter: Payer: Self-pay | Admitting: Cardiothoracic Surgery

## 2016-09-08 ENCOUNTER — Ambulatory Visit
Admission: RE | Admit: 2016-09-08 | Discharge: 2016-09-08 | Disposition: A | Discharge status: Home / Self Care | Payer: Managed Care, Other (non HMO) | Source: Ambulatory Visit | Attending: Cardiothoracic Surgery | Admitting: Cardiothoracic Surgery

## 2016-09-08 ENCOUNTER — Ambulatory Visit (INDEPENDENT_AMBULATORY_CARE_PROVIDER_SITE_OTHER): Payer: Self-pay | Admitting: Cardiothoracic Surgery

## 2016-09-08 VITALS — BP 111/72 | HR 56 | Resp 16 | Ht 72.0 in | Wt 150.6 lb

## 2016-09-08 DIAGNOSIS — J95812 Postprocedural air leak: Secondary | ICD-10-CM

## 2016-09-08 DIAGNOSIS — C3411 Malignant neoplasm of upper lobe, right bronchus or lung: Secondary | ICD-10-CM

## 2016-09-08 DIAGNOSIS — C3490 Malignant neoplasm of unspecified part of unspecified bronchus or lung: Secondary | ICD-10-CM

## 2016-09-08 DIAGNOSIS — Z902 Acquired absence of lung [part of]: Secondary | ICD-10-CM

## 2016-09-08 MED ORDER — TRAMADOL HCL 50 MG PO TABS: 50.0000 mg | ORAL_TABLET | Freq: Four times a day (QID) | ORAL | 0 refills | Status: DC | PRN

## 2016-09-08 NOTE — Progress Notes (Signed)
[address]       [name], [address]             [phone]      [name] [place] Medical Record #[id number] te of Birth: June 10, 1951 Da Referring: [name], MD Primary Care: Pcp Not In System  Chief Complaint:   POST OP FOLLOW UP  07/19/2016  OPERATIVE REPORT  PREOPERATIVE DIAGNOSIS:  Right upper lobe lung mass. POSTOPERATIVE DIAGNOSIS:  Right upper lobe lung mass. SURGICAL PROCEDURE:  Video bronchoscopy, right video-assisted thoracoscopy, mini thoracotomy, wedge resection of right upper lobe and superior segment of right lower lobe with completion right upper lobectomy, lymph node dissection, and placement of On-Q device. SURGEON:  [name], MD.  08/01/2016 DATE OF DISCHARGE:  OPERATIVE REPORT PREOPERATIVE DIAGNOSIS:  Persistent air leak following right upper lobectomy. POSTOP DIAGNOSIS:  Persistent air leak following right upper lobectomy. SURGICAL PROCEDURE:  Bronchoscopy and placement of Spiration intrabronchial valves x3 in the superior segment of the right lower lobe, in the medial and lateral branches of the right middle lobe. SURGEON:  [name], MD.  08/04/2016 PERATIVE REPORT PREOPERATIVE DIAGNOSIS:  Persistent postoperative air leak. POSTOPERATIVE DIAGNOSIS:  Persistent postoperative air leak. SURGICAL PROCEDURE:  Reoperation with right video-assisted thoracoscopy for persistent air leak with oversewing of the right lower lobe air leak. SURGEON:  [name], MD.  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 The patient is improving, his appetite is better, he's  gaining weight, he remains off cigarettes. He has continued to need some pain medicine but this need is been decreasing, he notes she's taking pain medication just at night currently.  He would like to discuss his oncology situation medical oncology, but I suspect with his Ia disease follow-up chemotherapy would not be indicated.      Past Medical History:  Diagnosis Date  . Arthritis   . Cancer [facility])    pt states possible lung cancer to right lung  . COPD (chronic obstructive pulmonary disease) ([place])   . Dyspnea    with exertion  . Head injury    a. remotely with cranial plate in place.  . Lung mass    a. @ [facility] - 2.5 cm right upper lobe hypermetabolic mass highly suggestive of primary lung cancer.   . Tobacco abuse      History  Smoking Status  . Current Every Day Smoker  . Packs/day: 1.00  . Years: 50.00  . Types: Cigarettes  Smokeless Tobacco  . Never Used    History  Alcohol Use No     Allergies  Allergen Reactions  . No Known Allergies     Current Outpatient Prescriptions  Medication Sig Dispense Refill  . acetaminophen (TYLENOL) 325 MG tablet Take 2 tablets (650 mg total) by mouth every 6 (six) hours as needed for mild pain.    Marland Kitchen albuterol (PROVENTIL HFA;VENTOLIN HFA) 108 (90 Base) MCG/ACT inhaler Inhale 2 puffs into the lungs every 6 (six) hours as needed for wheezing or shortness of breath.    . budesonide-formoterol (SYMBICORT) 160-4.5 MCG/ACT inhaler Inhale 2 puffs into the lungs 2 (two) times  daily.    . guaiFENesin (MUCINEX) 600 MG 12 hr tablet Take 1 tablet (600 mg total) by mouth 2 (two) times daily as needed for cough or to loosen phlegm.    . SPIRIVA RESPIMAT 2.5 MCG/ACT AERS Inhale 2 puffs into the lungs 2 (two) times daily.    . tamsulosin (FLOMAX) 0.4 MG CAPS capsule Take 1 capsule (0.4 mg total) by mouth daily after breakfast. 30 capsule 1  . traMADol (ULTRAM) 50 MG tablet Take 1 tablet (50 mg total) by mouth every 6 (six) hours as needed for  severe pain. 30 tablet 0   No current facility-administered medications for this visit.        Physical Exam: BP 111/72 (BP Location: Right Arm, Patient Position: Sitting, Cuff Size: Large)   Pulse (!) 56   Resp 16   Ht 6' (1.829 m)   Wt 150 lb 9.6 oz (68.3 kg)   SpO2 99% Comment: ON RA  BMI 20.43 kg/m   General appearance: alert and cooperative Neurologic: intact Heart: regular rate and rhythm, S1, S2 normal, no murmur, click, rub or gallop Lungs: clear to auscultation bilaterally Abdomen: soft, non-tender; bowel sounds normal; no masses,  no organomegaly Extremities: extremities normal, atraumatic, no cyanosis or edema and Homans sign is negative, no sign of DVT Wound: His incisions and chest tube sites are healing well without evidence of infection   Diagnostic Studies & Laboratory data:     Recent Radiology Findings:   Dg Chest 2 View  Result Date: 09/08/2016 CLINICAL DATA:  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 EXAM: CHEST  2 VIEW COMPARISON:  08/25/2016 chest radiograph. FINDINGS: Intrabronchial valves overlie the right hilum. Surgical sutures overlie the upper parahilar right lung. Stable cardiomediastinal silhouette with normal heart size and aortic atherosclerosis. No pneumothorax. No pleural effusion. Status post right upper lobectomy. Stable mild scarring versus atelectasis at the medial right lung base. No new consolidative airspace disease. IMPRESSION: 1. No pneumothorax. 2. Stable postsurgical changes from right upper lobectomy with stable mild scarring versus atelectasis at the right lung base. Electronically Signed   By: Ilona Sorrel M.D.   On: 09/08/2016 08:48      Recent Lab Findings: Lab Results  Component Value Date   WBC 10.6 (H) 08/09/2016   HGB 12.6 (L) 08/09/2016   HCT 38.8 (L) 08/09/2016   PLT 388 08/09/2016   GLUCOSE 110 (H) 08/09/2016   CHOL 243 (H) 07/11/2016   TRIG 102  07/11/2016   HDL 75 07/11/2016   LDLCALC 148 (H) 07/11/2016   ALT 18 08/08/2016   AST 18 08/08/2016   NA 136 08/09/2016   K 3.8 08/09/2016   CL 94 (L) 08/09/2016   CREATININE 0.76 08/09/2016   BUN 8 08/09/2016   CO2 29 08/09/2016   INR 1.06 08/04/2016      Assessment / Plan:      Patient making postoperative rigors after complicated right upper lobe resection for stage I non-small cell carcinoma, with his postop course complicated by persistent air leak. At this point he still needs his bronchial valves removed. I discussed with him toward the end of March that we would as an outpatient perform bronchoscopy under general anesthesia to remove the bronchial valves.   He expressed a desire to be seen by medical oncology which we will arrange.    [name] MD      [place] [place] [address] Office [phone]  [place] [phone]  09/08/2016 10:13 AM

## 2016-09-09 ENCOUNTER — Telehealth: Payer: Self-pay | Admitting: *Deleted

## 2016-09-09 NOTE — Telephone Encounter (Signed)
Oncology Nurse Navigator Documentation  Oncology Nurse Navigator Flowsheets 09/09/2016  Referral date to RadOnc/MedOnc 09/09/2016  Navigator Encounter Type Telephone/I received referral today on [name]. I called [name] today and was unable to reach.  I left vm message with my name and phone number to call.   Telephone Outgoing Call  Barriers/Navigation Needs Coordination of Care  Interventions Coordination of Care  Coordination of Care Appts  Acuity Level 1  Time Spent with Patient 15

## 2016-09-12 ENCOUNTER — Telehealth: Payer: Self-pay | Admitting: *Deleted

## 2016-09-12 DIAGNOSIS — Z902 Acquired absence of lung [part of]: Secondary | ICD-10-CM

## 2016-09-12 NOTE — Telephone Encounter (Signed)
Oncology Nurse Navigator Documentation  Oncology Nurse Navigator Flowsheets 09/12/2016  Navigator Location [facility]  Navigator Encounter Type Telephone/I received vm message to call [name].  I called and gave the appt to see Dr. Julien Nordmann on 09/26/16.    Telephone Outgoing Call;Incoming Call  Barriers/Navigation Needs Coordination of Care  Interventions Coordination of Care  Coordination of Care Appts  Acuity Level 1  Acuity Level 1 Initial guidance, education and coordination as needed  Time Spent with Patient 15

## 2016-09-21 ENCOUNTER — Other Ambulatory Visit: Payer: Self-pay | Admitting: Cardiothoracic Surgery

## 2016-09-21 DIAGNOSIS — C3490 Malignant neoplasm of unspecified part of unspecified bronchus or lung: Secondary | ICD-10-CM

## 2016-09-22 ENCOUNTER — Encounter: Payer: Self-pay | Admitting: Cardiothoracic Surgery

## 2016-09-22 ENCOUNTER — Other Ambulatory Visit: Payer: Self-pay | Admitting: *Deleted

## 2016-09-22 ENCOUNTER — Other Ambulatory Visit: Payer: Self-pay

## 2016-09-22 ENCOUNTER — Ambulatory Visit
Admission: RE | Admit: 2016-09-22 | Discharge: 2016-09-22 | Disposition: A | Discharge status: Home / Self Care | Payer: Managed Care, Other (non HMO) | Source: Ambulatory Visit | Attending: Cardiothoracic Surgery | Admitting: Cardiothoracic Surgery

## 2016-09-22 ENCOUNTER — Ambulatory Visit (INDEPENDENT_AMBULATORY_CARE_PROVIDER_SITE_OTHER): Payer: Self-pay | Admitting: Cardiothoracic Surgery

## 2016-09-22 VITALS — BP 125/74 | HR 82 | Resp 20 | Ht 72.0 in | Wt 152.8 lb

## 2016-09-22 DIAGNOSIS — C3490 Malignant neoplasm of unspecified part of unspecified bronchus or lung: Secondary | ICD-10-CM

## 2016-09-22 DIAGNOSIS — R0602 Shortness of breath: Secondary | ICD-10-CM

## 2016-09-22 DIAGNOSIS — Z902 Acquired absence of lung [part of]: Secondary | ICD-10-CM

## 2016-09-22 DIAGNOSIS — IMO0002 Reserved for concepts with insufficient information to code with codable children: Secondary | ICD-10-CM

## 2016-09-22 DIAGNOSIS — J9382 Other air leak: Secondary | ICD-10-CM

## 2016-09-22 DIAGNOSIS — M542 Cervicalgia: Secondary | ICD-10-CM

## 2016-09-22 DIAGNOSIS — C3411 Malignant neoplasm of upper lobe, right bronchus or lung: Secondary | ICD-10-CM

## 2016-09-22 DIAGNOSIS — J95812 Postprocedural air leak: Secondary | ICD-10-CM

## 2016-09-22 MED ORDER — LEVALBUTEROL TARTRATE 45 MCG/ACT IN AERO: 2.0000 | INHALATION_SPRAY | RESPIRATORY_TRACT | 1 refills | Status: DC | PRN

## 2016-09-22 MED ORDER — BUDESONIDE-FORMOTEROL FUMARATE 160-4.5 MCG/ACT IN AERO: 2.0000 | INHALATION_SPRAY | Freq: Two times a day (BID) | RESPIRATORY_TRACT | 3 refills | Status: DC

## 2016-09-22 MED ORDER — TRAMADOL HCL 50 MG PO TABS: 50.0000 mg | ORAL_TABLET | Freq: Four times a day (QID) | ORAL | 0 refills | Status: DC | PRN

## 2016-09-22 MED ORDER — SPIRIVA RESPIMAT 2.5 MCG/ACT IN AERS: 2.0000 | INHALATION_SPRAY | Freq: Two times a day (BID) | RESPIRATORY_TRACT | 3 refills | Status: DC

## 2016-09-22 NOTE — Progress Notes (Signed)
[address]       [place] [address]             [phone]      [facility] [facility] [place] Medical Record #[id number] te of Birth: [birth year] Da Referring: [name], MD Primary Care: Pcp Not In System  Chief Complaint:   POST OP FOLLOW UP  07/19/2016  OPERATIVE REPORT  PREOPERATIVE DIAGNOSIS:  Right upper lobe lung mass. POSTOPERATIVE DIAGNOSIS:  Right upper lobe lung mass. SURGICAL PROCEDURE:  Video bronchoscopy, right video-assisted thoracoscopy, mini thoracotomy, wedge resection of right upper lobe and superior segment of right lower lobe with completion right upper lobectomy, lymph node dissection, and placement of On-Q device. SURGEON:  [name], MD.  08/01/2016 DATE OF DISCHARGE:  OPERATIVE REPORT PREOPERATIVE DIAGNOSIS:  Persistent air leak following right upper lobectomy. POSTOP DIAGNOSIS:  Persistent air leak following right upper lobectomy. SURGICAL PROCEDURE:  Bronchoscopy and placement of Spiration intrabronchial valves x3 in the superior segment of the right lower lobe, in the medial and lateral branches of the right middle lobe. SURGEON:  [name], MD.  08/04/2016 PERATIVE REPORT PREOPERATIVE DIAGNOSIS:  Persistent postoperative air leak. POSTOPERATIVE DIAGNOSIS:  Persistent postoperative air leak. SURGICAL PROCEDURE:  Reoperation with right video-assisted thoracoscopy for persistent air leak with oversewing of the right lower lobe air leak. SURGEON:  [name], MD.  Cancer Staging Lung cancer 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 The patient is improving, his appetite is better, he's  gaining weight, he remains off cigarettes. He has continued to need some pain medicine but this need is been decreasing, he notes she's taking pain medication just at night currently.  He would like to discuss his oncology situation medical oncology, but I suspect with his Ia disease follow-up chemotherapy would not be indicated. Has appointment early next week at the [facility].  Patient notes over the past week increasing stiffness in his neck discomfort, he has had chronic neck and back issues in the past.      Past Medical History:  Diagnosis Date  . Arthritis   . Cancer [facility])    pt states possible lung cancer to right lung  . COPD (chronic obstructive pulmonary disease) ([place])   . Dyspnea    with exertion  . Head injury    a. remotely with cranial plate in place.  . Lung mass    a. @ [facility] - 2.5 cm right upper lobe hypermetabolic mass highly suggestive of primary lung cancer.   . Tobacco abuse      History  Smoking Status  . Current Every Day Smoker  . Packs/day: 1.00  . Years: 50.00  . Types: Cigarettes  Smokeless Tobacco  . Never Used    History  Alcohol Use No     Allergies  Allergen Reactions  . No Known Allergies     Current Outpatient Prescriptions  Medication Sig Dispense Refill  . acetaminophen (TYLENOL) 325 MG tablet Take 2 tablets (650 mg total) by mouth every 6 (six) hours as needed for mild pain.    Marland Kitchen albuterol (PROVENTIL HFA;VENTOLIN HFA) 108 (90 Base) MCG/ACT inhaler Inhale 2 puffs  into the lungs every 6 (six) hours as needed for wheezing or shortness of breath.    . budesonide-formoterol (SYMBICORT) 160-4.5 MCG/ACT inhaler Inhale 2 puffs into the lungs 2 (two) times daily.    Marland Kitchen guaiFENesin (MUCINEX) 600 MG 12 hr tablet Take 1 tablet (600 mg total) by mouth 2 (two) times daily as needed for cough or to loosen phlegm.    . SPIRIVA RESPIMAT 2.5 MCG/ACT AERS Inhale 2 puffs into the lungs 2 (two) times daily.    . tamsulosin (FLOMAX) 0.4 MG CAPS  capsule Take 1 capsule (0.4 mg total) by mouth daily after breakfast. 30 capsule 1  . traMADol (ULTRAM) 50 MG tablet Take 1 tablet (50 mg total) by mouth every 6 (six) hours as needed for severe pain. 30 tablet 0   No current facility-administered medications for this visit.        Physical Exam: BP 125/74   Pulse 82   Resp 20   Ht 6' (1.829 m)   Wt 152 lb 12.8 oz (69.3 kg)   BMI 20.72 kg/m   General appearance: alert and cooperative Neurologic: intact Heart: regular rate and rhythm, S1, S2 normal, no murmur, click, rub or gallop Lungs: clear to auscultation bilaterally Abdomen: soft, non-tender; bowel sounds normal; no masses,  no organomegaly Extremities: extremities normal, atraumatic, no cyanosis or edema and Homans sign is negative, no sign of DVT Wound: His incisions and chest tube sites are healing well without evidence of infection   Diagnostic Studies & Laboratory data:     Recent Radiology Findings:   Dg Chest 2 View  Result Date: 09/22/2016 CLINICAL DATA:  66 year old male with history of VATS procedure in January 2018 presenting with shortness of breath and right-sided chest pain. EXAM: CHEST  2 VIEW COMPARISON:  Chest x-ray 09/18/2016. FINDINGS: Postoperative changes of right upper lobectomy are again noted, with compensatory hyperexpansion of the right middle and lower lobes. Multiple interbronchial valve (IBV) devices are seen projecting over the right middle lobe. No acute consolidative airspace disease. No pleural effusions. No evidence of pulmonary edema. Heart size is normal. Mediastinal contours are slightly distorted, similar to the other postoperative examinations. IMPRESSION: 1. Postoperative changes, as above. No radiographic evidence of acute cardiopulmonary disease. Electronically Signed   By: Vinnie Langton M.D.   On: 09/22/2016 13:08      Recent Lab Findings: Lab Results  Component Value Date   WBC 10.6 (H) 08/09/2016   HGB 12.6 (L) 08/09/2016    HCT 38.8 (L) 08/09/2016   PLT 388 08/09/2016   GLUCOSE 110 (H) 08/09/2016   CHOL 243 (H) 07/11/2016   TRIG 102 07/11/2016   HDL 75 07/11/2016   LDLCALC 148 (H) 07/11/2016   ALT 18 08/08/2016   AST 18 08/08/2016   NA 136 08/09/2016   K 3.8 08/09/2016   CL 94 (L) 08/09/2016   CREATININE 0.76 08/09/2016   BUN 8 08/09/2016   CO2 29 08/09/2016   INR 1.06 08/04/2016      Assessment / Plan:      Patient making postoperative rigors after complicated right upper lobe resection for stage I non-small cell carcinoma, with his postop course complicated by persistent air leak.   Plan to proceed with bronchoscopy under general anesthesia for removal of bronchial valves Wednesday, March 28.  We will obtain a MRI of the neck to rule out cervical disc disease, the patient does note that he has a titanium plate in his left face but has had MRIs done  in the past.      [name] MD      [address] [place] [address] Office [phone]   Beeper [phone]  09/22/2016 1:42 PM

## 2016-09-26 ENCOUNTER — Ambulatory Visit (HOSPITAL_BASED_OUTPATIENT_CLINIC_OR_DEPARTMENT_OTHER): Payer: Managed Care, Other (non HMO) | Admitting: Internal Medicine

## 2016-09-26 ENCOUNTER — Other Ambulatory Visit (HOSPITAL_BASED_OUTPATIENT_CLINIC_OR_DEPARTMENT_OTHER): Payer: Managed Care, Other (non HMO)

## 2016-09-26 ENCOUNTER — Telehealth: Payer: Self-pay | Admitting: Internal Medicine

## 2016-09-26 ENCOUNTER — Encounter: Payer: Self-pay | Admitting: Internal Medicine

## 2016-09-26 DIAGNOSIS — J449 Chronic obstructive pulmonary disease, unspecified: Secondary | ICD-10-CM

## 2016-09-26 DIAGNOSIS — Z902 Acquired absence of lung [part of]: Secondary | ICD-10-CM

## 2016-09-26 DIAGNOSIS — C3411 Malignant neoplasm of upper lobe, right bronchus or lung: Secondary | ICD-10-CM | POA: Diagnosis not present

## 2016-09-26 DIAGNOSIS — Z800 Family history of malignant neoplasm of digestive organs: Secondary | ICD-10-CM | POA: Diagnosis not present

## 2016-09-26 DIAGNOSIS — C3491 Malignant neoplasm of unspecified part of right bronchus or lung: Secondary | ICD-10-CM

## 2016-09-26 DIAGNOSIS — Z87891 Personal history of nicotine dependence: Secondary | ICD-10-CM

## 2016-09-26 DIAGNOSIS — R0789 Other chest pain: Secondary | ICD-10-CM

## 2016-09-26 DIAGNOSIS — Z952 Presence of prosthetic heart valve: Secondary | ICD-10-CM | POA: Diagnosis not present

## 2016-09-26 HISTORY — DX: Malignant neoplasm of unspecified part of right bronchus or lung: C34.91

## 2016-09-26 LAB — CBC WITH DIFFERENTIAL/PLATELET
BASO%: 0.7 % (ref 0.0–2.0)
Basophils Absolute: 0.1 10*3/uL (ref 0.0–0.1)
EOS%: 1.1 % (ref 0.0–7.0)
Eosinophils Absolute: 0.1 10*3/uL (ref 0.0–0.5)
HEMATOCRIT: 40.1 % (ref 38.4–49.9)
HEMOGLOBIN: 13.2 g/dL (ref 13.0–17.1)
LYMPH#: 1.1 10*3/uL (ref 0.9–3.3)
LYMPH%: 13.8 % — ABNORMAL LOW (ref 14.0–49.0)
MCH: 29.6 pg (ref 27.2–33.4)
MCHC: 32.9 g/dL (ref 32.0–36.0)
MCV: 89.8 fL (ref 79.3–98.0)
MONO#: 0.8 10*3/uL (ref 0.1–0.9)
MONO%: 10.9 % (ref 0.0–14.0)
NEUT%: 73.5 % (ref 39.0–75.0)
NEUTROS ABS: 5.7 10*3/uL (ref 1.5–6.5)
PLATELETS: 207 10*3/uL (ref 140–400)
RBC: 4.47 10*6/uL (ref 4.20–5.82)
RDW: 14.3 % (ref 11.0–14.6)
WBC: 7.7 10*3/uL (ref 4.0–10.3)

## 2016-09-26 LAB — COMPREHENSIVE METABOLIC PANEL
ALT: 19 U/L (ref 0–55)
ANION GAP: 12 meq/L — AB (ref 3–11)
AST: 17 U/L (ref 5–34)
Albumin: 3.3 g/dL — ABNORMAL LOW (ref 3.5–5.0)
Alkaline Phosphatase: 276 U/L — ABNORMAL HIGH (ref 40–150)
BILIRUBIN TOTAL: 0.37 mg/dL (ref 0.20–1.20)
BUN: 4.5 mg/dL — AB (ref 7.0–26.0)
CALCIUM: 9.4 mg/dL (ref 8.4–10.4)
CHLORIDE: 105 meq/L (ref 98–109)
CO2: 25 mEq/L (ref 22–29)
Creatinine: 0.8 mg/dL (ref 0.7–1.3)
EGFR: >90 mL/min/{1.73_m2} (ref >90)
Glucose: 102 mg/dl (ref 70–140)
Potassium: 3.7 mEq/L (ref 3.5–5.1)
Sodium: 142 mEq/L (ref 136–145)
TOTAL PROTEIN: 7.2 g/dL (ref 6.4–8.3)

## 2016-09-26 NOTE — Progress Notes (Signed)
[name]   [phone]  CONSULT NOTE  REFERRING PHYSICIAN: Dr. [name]  REASON FOR CONSULTATION:  66 years old white male recently diagnosed with lung cancer.  HPI [name] is a 66 y.o. male with past medical history significant for COPD, osteoarthritis, head injury during a softball game as well as long history of smoking. The patient mentions that in December 2017 he has been complaining of worsening cough as well as COPD exacerbation. He was seen at one of the urgent care center and chest x-ray showed questionable nodule in the right lung. He had CT scan of the chest performed on 06/08/2016 that showed right upper lobe lung mass. This was followed by a PET scan on 06/22/2016 and it showed malignant change FDG uptake associated with the right upper lobe pulmonary nodule measuring 2.4 cm and an SUV max equal to 10.6. There is no hypermetabolic thoracic adenopathy or evidence of distant metastatic disease. The patient was referred to Dr. Servando Snare. He underwent pulmonary function test as well as Cardiolite stress test. The patient underwent a video bronchoscopy in addition to right upper lobectomy and wedge resection of the superior segment of the right lower lobe with lymph node dissection under the care of Dr. Servando Snare on 07/19/2016. The final pathology ([id number]) showed 2.5 cm well-differentiated invasive adenocarcinoma with negative resection margin and negative tumor and the dissected lymph nodes. His surgery was complicated with airleak the patient underwent several surgical intervention and valve placement. He scheduled for another surgery tomorrow for removal of the valves. He is recovering well from his surgery but he continues to have tightness in his chest and numbness in the lower ribs. He also has shortness breath with exertion and worsening cough with humidity. He denied having any significant hemoptysis. He lost around  30 pounds in the last few months. He denied having any nausea, vomiting, diarrhea or constipation. He has no headache or visual changes. Family history significant for mother died at age 58 with brain aneurysm. Father had colon cancer and died in his 68s. He also has a brother with prostate cancer. The patient is married and has 2 children and 3 stepchildren. He was accompanied by his wife [name]. He works for [employer]. He has a history of smoking up to 2 packs per day for around 45 years and quit January 2018. He has no history of alcohol or drug abuse.  HPI  Past Medical History:  Diagnosis Date  . Adenocarcinoma of right lung, stage 1 ([place]) 09/26/2016  . Arthritis   . Cancer [facility])    pt states possible lung cancer to right lung  . COPD (chronic obstructive pulmonary disease) ([place])   . Dyspnea    with exertion  . Head injury    a. remotely with cranial plate in place.  . Lung mass    a. @ [facility] - 2.5 cm right upper lobe hypermetabolic mass highly suggestive of primary lung cancer.   . Tobacco abuse     Past Surgical History:  Procedure Laterality Date  . CARPAL TUNNEL RELEASE Right   . COLONOSCOPY    . ELBOW SURGERY     surgery x2 on right elbow  . EYE SURGERY     eye socket fracture, metal plate on left side of face  . HAND SURGERY     multiple hand surgeries on right hand  . LOBECTOMY Right 07/19/2016   Procedure: LOBECTOMY RIGHT UPPER LOBE WITH WEDGE RESECTION  SUPERIOR SEGMENT RIGHT LOWER LOBE WITH PLACEMENT OF ON-Q;  Surgeon: [name], MD;  Location: [facility];  Service: Thoracic;  Laterality: Right;  . LYMPH NODE DISSECTION Right 07/19/2016   Procedure: LYMPH NODE DISSECTION;  Surgeon: [name], MD;  Location: [facility];  Service: Thoracic;  Laterality: Right;  . SKIN GRAFT     to right hand   . VIDEO ASSISTED THORACOSCOPY Right 08/04/2016   Procedure: RIGHT VIDEO ASSISTED THORACOSCOPY WITH SUTURE CLOSURE OF AIR LEAK;  Surgeon: [name], MD;  Location: [facility];  Service: Thoracic;  Laterality: Right;  Marland Kitchen VIDEO ASSISTED THORACOSCOPY (VATS)/WEDGE RESECTION Right 07/19/2016   Procedure: VIDEO ASSISTED THORACOSCOPY (VATS);  Surgeon: [name], MD;  Location: [facility];  Service: Thoracic;  Laterality: Right;  Marland Kitchen VIDEO BRONCHOSCOPY N/A 07/19/2016   Procedure: VIDEO BRONCHOSCOPY;  Surgeon: [name], MD;  Location[facility] OR;  Service: Thoracic;  Laterality: N/A;  . VIDEO BRONCHOSCOPY WITH INSERTION OF INTERBRONCHIAL VALVE (IBV) Right 08/01/2016   Procedure: VIDEO BRONCHOSCOPY WITH INSERTION OF INTERBRONCHIAL VALVE (IBV);  Surgeon: [name], MD;  Location: [facility];  Service: Thoracic;  Laterality: Right;  . WEDGE RESECTION Right 07/19/2016   Procedure: WEDGE  RESECTION RIGHT UPPER LOBE;  Surgeon: [name], MD;  Location: [facility] OR;  Service: Thoracic;  Laterality: Right;    Family History  Problem Relation Age of Onset  . Aneurysm Mother     Brain  . Other Mother     Pacemaker  . Colon cancer Father   . Heart disease Father     CABG in his 35s, passed at 7  . Hypercholesterolemia Brother     Social History Social History  Substance Use Topics  . Smoking status: Former Smoker    Packs/day: 1.00    Years: 50.00    Types: Cigarettes    Quit date: 07/19/2016  . Smokeless tobacco: Never Used  . Alcohol use No    No Known Allergies  Current Outpatient Prescriptions  Medication Sig Dispense Refill  . acetaminophen (EQL ARTHRITIS PAIN RELIEF) 650 MG CR tablet Take 650 mg by mouth every 8 (eight) hours as needed for pain.    Marland Kitchen albuterol (PROVENTIL HFA;VENTOLIN HFA) 108 (90 Base) MCG/ACT inhaler Inhale 2 puffs into the lungs every 6 (six) hours as needed for wheezing or shortness of breath.    . budesonide-formoterol (SYMBICORT) 160-4.5 MCG/ACT inhaler Inhale 2 puffs into the lungs 2 (two) times daily. 1 Inhaler 3  . levalbuterol (XOPENEX HFA) 45 MCG/ACT inhaler Inhale 2 puffs into the lungs every 4 (four) hours as needed for  shortness of breath. 1 Inhaler 1  . tamsulosin (FLOMAX) 0.4 MG CAPS capsule Take 1 capsule (0.4 mg total) by mouth daily after breakfast. 30 capsule 1  . traMADol (ULTRAM) 50 MG tablet Take 1 tablet (50 mg total) by mouth every 6 (six) hours as needed for severe pain. 30 tablet 0   No current facility-administered medications for this visit.     Review of Systems  Constitutional: positive for fatigue and weight loss Eyes: negative Ears, nose, mouth, throat, and face: negative Respiratory: positive for cough, dyspnea on exertion and pleurisy/chest pain Cardiovascular: negative Gastrointestinal: negative Genitourinary:negative Integument/breast: negative Hematologic/lymphatic: negative Musculoskeletal:negative Neurological: negative Behavioral/Psych: negative Endocrine: negative Allergic/Immunologic: negative  Physical Exam  QPY:PPJKD, healthy, no distress, well nourished, well developed and anxious SKIN: skin color, texture, turgor are normal, no rashes or significant lesions HEAD: Normocephalic, No masses, lesions, tenderness or  abnormalities EYES: normal, PERRLA, Conjunctiva are pink and non-injected EARS: External ears normal, Canals clear OROPHARYNX:no exudate, no erythema and lips, buccal mucosa, and tongue normal  NECK: supple, no adenopathy, no JVD LYMPH:  no palpable lymphadenopathy, no hepatosplenomegaly LUNGS: clear to auscultation , and palpation HEART: regular rate & rhythm, no murmurs and no gallops ABDOMEN:abdomen soft, non-tender, normal bowel sounds and no masses or organomegaly BACK: Back symmetric, no curvature., No CVA tenderness EXTREMITIES:no joint deformities, effusion, or inflammation, no edema, no skin discoloration  NEURO: alert & oriented x 3 with fluent speech, no focal motor/sensory deficits  PERFORMANCE STATUS: ECOG 1  LABORATORY DATA: Lab Results  Component Value Date   WBC 7.7 09/26/2016   HGB 13.2 09/26/2016   HCT 40.1 09/26/2016   MCV  89.8 09/26/2016   PLT 207 09/26/2016      Chemistry      Component Value Date/Time   NA 142 09/26/2016 1336   K 3.7 09/26/2016 1336   CL 94 (L) 08/09/2016 0846   CO2 25 09/26/2016 1336   BUN 4.5 (L) 09/26/2016 1336   CREATININE 0.8 09/26/2016 1336      Component Value Date/Time   CALCIUM 9.4 09/26/2016 1336   ALKPHOS 276 (H) 09/26/2016 1336   AST 17 09/26/2016 1336   ALT 19 09/26/2016 1336   BILITOT 0.37 09/26/2016 1336       RADIOGRAPHIC STUDIES: Dg Chest 2 View  Result Date: 09/22/2016 CLINICAL DATA:  66 year old male with history of VATS procedure in January 2018 presenting with shortness of breath and right-sided chest pain. EXAM: CHEST  2 VIEW COMPARISON:  Chest x-ray 09/18/2016. FINDINGS: Postoperative changes of right upper lobectomy are again noted, with compensatory hyperexpansion of the right middle and lower lobes. Multiple interbronchial valve (IBV) devices are seen projecting over the right middle lobe. No acute consolidative airspace disease. No pleural effusions. No evidence of pulmonary edema. Heart size is normal. Mediastinal contours are slightly distorted, similar to the other postoperative examinations. IMPRESSION: 1. Postoperative changes, as above. No radiographic evidence of acute cardiopulmonary disease. Electronically Signed   By: Vinnie Langton M.D.   On: 09/22/2016 13:08   Dg Chest 2 View  Result Date: 09/08/2016 CLINICAL DATA:  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 EXAM: CHEST  2 VIEW COMPARISON:  08/25/2016 chest radiograph. FINDINGS: Intrabronchial valves overlie the right hilum. Surgical sutures overlie the upper parahilar right lung. Stable cardiomediastinal silhouette with normal heart size and aortic atherosclerosis. No pneumothorax. No pleural effusion. Status post right upper lobectomy. Stable mild scarring versus atelectasis at the medial right lung base. No new consolidative  airspace disease. IMPRESSION: 1. No pneumothorax. 2. Stable postsurgical changes from right upper lobectomy with stable mild scarring versus atelectasis at the right lung base. Electronically Signed   By: Ilona Sorrel M.D.   On: 09/08/2016 08:48    ASSESSMENT: This is a very pleasant 66 years old white male recently diagnosed with a stage IA (T1b, N0, M0) non-small cell lung cancer, well-differentiated adenocarcinoma presented with right lower lobe lung nodule status post right upper lobectomy as well as wedge resection of the superior segment of the left lower lobe in January 2018 under the care of Dr. Servando Snare. The patient had a complicated post operative course with airleak but he is feeling better today.   PLAN: I had a lengthy discussion with the patient and his wife today about his current disease stage, prognosis and treatment options. I explained  to the patient that there is no survival benefit for adjuvant systemic chemotherapy for patient with a stage IA lung cancer and the current standard of care is observation and close monitoring. I will arrange for the patient to have repeat CT scan of the chest in 6 months for restaging of his disease. For the history of postoperative airleak, the patient is scheduled for surgical removal of the valves tomorrow under the care of Dr. Servando Snare. For pain management he will continue his current treatment with tramadol for now. For COPD the patient is currently on Symbicort, Xopenex and albuterol. The patient voices understanding of current disease status and treatment options and is in agreement with the current care plan.  All questions were answered. The patient knows to call the clinic with any problems, questions or concerns. We can certainly see the patient much sooner if necessary.  Thank you so much for allowing me to participate in the care of [name]. I will continue to follow up the patient with you and assist in his care.  I spent 40  minutes counseling the patient face to face. The total time spent in the appointment was 60 minutes.  Disclaimer: This note was dictated with voice recognition software. Similar sounding words can inadvertently be transcribed and may not be corrected upon review.   Saranda Legrande K. September 26, 2016, 3:02 PM

## 2016-09-26 NOTE — Telephone Encounter (Signed)
Gave patient AVS and calender per 09/26/2016 los. [facility] to contact patient with CT schedule.

## 2016-09-27 ENCOUNTER — Encounter (HOSPITAL_COMMUNITY)
Admission: RE | Admit: 2016-09-27 | Discharge: 2016-09-27 | Disposition: A | Discharge status: Home / Self Care | Payer: Managed Care, Other (non HMO) | Source: Ambulatory Visit | Attending: Cardiothoracic Surgery | Admitting: Cardiothoracic Surgery

## 2016-09-27 ENCOUNTER — Encounter (HOSPITAL_COMMUNITY): Payer: Self-pay

## 2016-09-27 ENCOUNTER — Ambulatory Visit (HOSPITAL_COMMUNITY)
Admission: RE | Admit: 2016-09-27 | Discharge: 2016-09-27 | Disposition: A | Discharge status: Home / Self Care | Payer: Managed Care, Other (non HMO) | Source: Ambulatory Visit | Attending: Cardiothoracic Surgery | Admitting: Cardiothoracic Surgery

## 2016-09-27 DIAGNOSIS — Z01818 Encounter for other preprocedural examination: Secondary | ICD-10-CM | POA: Diagnosis present

## 2016-09-27 DIAGNOSIS — J9382 Other air leak: Secondary | ICD-10-CM | POA: Insufficient documentation

## 2016-09-27 DIAGNOSIS — J439 Emphysema, unspecified: Secondary | ICD-10-CM | POA: Diagnosis not present

## 2016-09-27 DIAGNOSIS — IMO0002 Reserved for concepts with insufficient information to code with codable children: Secondary | ICD-10-CM

## 2016-09-27 HISTORY — DX: Headache, unspecified: R51.9

## 2016-09-27 HISTORY — DX: Pneumonia, unspecified organism: J18.9

## 2016-09-27 HISTORY — DX: Benign prostatic hyperplasia without lower urinary tract symptoms: N40.0

## 2016-09-27 HISTORY — DX: Headache: R51

## 2016-09-27 LAB — CBC
HCT: 40.9 % (ref 39.0–52.0)
Hemoglobin: 13.2 g/dL (ref 13.0–17.0)
MCH: 29.3 pg (ref 26.0–34.0)
MCHC: 32.3 g/dL (ref 30.0–36.0)
MCV: 90.7 fL (ref 78.0–100.0)
Platelets: 220 10*3/uL (ref 150–400)
RBC: 4.51 MIL/uL (ref 4.22–5.81)
RDW: 13.8 % (ref 11.5–15.5)
WBC: 8 10*3/uL (ref 4.0–10.5)

## 2016-09-27 LAB — COMPREHENSIVE METABOLIC PANEL
ALT: 22 U/L (ref 17–63)
AST: 23 U/L (ref 15–41)
Albumin: 3.3 g/dL — ABNORMAL LOW (ref 3.5–5.0)
Alkaline Phosphatase: 230 U/L — ABNORMAL HIGH (ref 38–126)
Anion gap: 11 (ref 5–15)
BUN: 5 mg/dL — ABNORMAL LOW (ref 6–20)
CO2: 24 mmol/L (ref 22–32)
Calcium: 8.9 mg/dL (ref 8.9–10.3)
Chloride: 103 mmol/L (ref 101–111)
Creatinine, Ser: 0.92 mg/dL (ref 0.61–1.24)
GFR calc Af Amer: >60 mL/min (ref >60)
GFR calc non Af Amer: >60 mL/min (ref >60)
Glucose, Bld: 120 mg/dL — ABNORMAL HIGH (ref 65–99)
Potassium: 3.6 mmol/L (ref 3.5–5.1)
Sodium: 138 mmol/L (ref 135–145)
Total Bilirubin: 0.4 mg/dL (ref 0.3–1.2)
Total Protein: 6.6 g/dL (ref 6.5–8.1)

## 2016-09-27 LAB — PROTIME-INR
INR: 1.04
Prothrombin Time: 13.6 seconds (ref 11.4–15.2)

## 2016-09-27 LAB — APTT: aPTT: 41 seconds — ABNORMAL HIGH (ref 24–36)

## 2016-09-27 LAB — SURGICAL PCR SCREEN
MRSA, PCR: POSITIVE — AB
Staphylococcus aureus: POSITIVE — AB

## 2016-09-27 NOTE — Progress Notes (Signed)
Alkaline Phospate 230 today, was 276 omn 09/26/16, on 08/08/16 it was 75.  I gave the results to Dr Roderic Palau, he said it would have to be evaluated in am.

## 2016-09-27 NOTE — Pre-Procedure Instructions (Signed)
KELTON BULTMAN  09/27/2016      [facility], [address] [place] [address] Phone: [phone] Fax: [phone]    Your procedure is scheduled on March 28  Report to [facility] at [phone].  Call this number if you have problems the morning of surgery:  [phone]   Remember:  Do not eat food or drink liquids after midnight.  Take these medicines the morning of surgery with A SIP OF WATER Xopenex inhaler if needed,  Symbicort inhaler, Albuterol (Proventil) inhaler if needed- bring your inhalers with you on the day of surgery, Flomax, Tramadol (ultram) if needed  Stop taking BC's, Goody's, Aspirin, Ibuprofen, Advil, Motrin, Aleve, Vitamins, Herbal medications, Fish Oil   Do not wear jewelry, make-up or nail polish.  Do not wear lotions, powders, or perfumes, or deoderant.  Do not shave 48 hours prior to surgery.  Men may shave face and neck.  Do not bring valuables to the hospital.  [facility] is not responsible for any belongings or valuables.  Contacts, dentures or bridgework may not be worn into surgery.  Leave your suitcase in the car.  After surgery it may be brought to your room.  For patients admitted to the hospital, discharge time will be determined by your treatment team.  Patients discharged the day of surgery will not be allowed to drive home.    Special instructions:  [facility] - Preparing for Surgery  Before surgery, you can play an important role.  Because skin is not sterile, your skin needs to be as free of germs as possible.  You can reduce the number of germs on you skin by washing with CHG (chlorahexidine gluconate) soap before surgery.  CHG is an antiseptic cleaner which kills germs and bonds with the skin to continue killing germs even after washing.  Please DO NOT use if you have an allergy to CHG or antibacterial soaps.   If your skin becomes reddened/irritated stop using the CHG and inform your nurse when you arrive at Short Stay.  Do not shave (including legs and underarms) for at least 48 hours prior to the first CHG shower.  You may shave your face.  Please follow these instructions carefully:   1.  Shower with CHG Soap the night before surgery and the     morning of Surgery.  2.  If you choose to wash your hair, wash your hair first as usual with your    normal shampoo.  3.  After you shampoo, rinse your hair and body thoroughly to remove the Shampoo.  4.  Use CHG as you would any other liquid soap.  You can apply chg directly       to the skin and wash gently with scrungie or a clean washcloth.  5.  Apply the CHG Soap to your body ONLY FROM THE NECK DOWN.        Do not use on open wounds or open sores.  Avoid contact with your eyes,       ears, mouth and genitals (private parts).  Wash genitals (private parts)       with your normal soap.  6.  Wash thoroughly, paying special attention to the area where your surgery        will be performed.  7.  Thoroughly rinse your body with warm water  from the neck down.  8.  DO NOT shower/wash with your normal soap after using and rinsing off       the CHG Soap.  9.  Pat yourself dry with a clean towel.            10.  Wear clean pajamas.            11.  Place clean sheets on your bed the night of your first shower and do not        sleep with pets.  Day of Surgery  Do not apply any lotions/deoderants the morning of surgery.  Please wear clean clothes to the hospital/surgery center.    Please read over the following fact sheets that you were given. Pain Booklet, Coughing and Deep Breathing, MRSA Information and Surgical Site Infection Prevention

## 2016-09-27 NOTE — Progress Notes (Addendum)
Denies having a PCP Denies ever seeing a cardiologist. Denies ever having a card cath or echo. Stress test noted from 07-13-16. Saw Dr Julien Nordmann for the first time yesterday 09-26-16. C/o burning and soreness to right rib cage area, but no chest pain.

## 2016-09-27 NOTE — Progress Notes (Signed)
Ryan called about blood consent and if we need to do a type and screen. States no we do not need a T&S and we do not need a blood consent.

## 2016-09-27 NOTE — Progress Notes (Signed)
Anesthesia Chart Review: Patient is a 66 year old male scheduled for video rhonchi Skippy, removal of intrabronchial valves 3 on 09/28/16 by Dr. Servando Snare.  History includes right lung cancer (adenocarcinoma stage 1A) s/p RU Lobectomy complicated by air leak s/p intrabronchial valves 3 08/01/16 and oversewing of RLL air leak 08/04/16, smoking, dyspnea on exertion, COPD, head injury (remote; with cranial plate), BPH, headaches, skin graft (right hand).  - No PCP is listed.  - HEM-ONC is Dr. Curt Bears.  - Patient was evaluated on 07/08/16 by Melina Copa, PA-C/Dr. Angelena Form for preoperative evaluation. Following normal stress test, PRN cardiology follow-up recommended.   Meds include albuterol, Symbicort, Xopenex, Flomax, tramadol.  BP 126/65   Pulse 75   Temp 36.7 C   Resp 18   Ht 6' (1.829 m)   Wt 153 lb 9.6 oz (69.7 kg)   SpO2 95%   BMI 20.83 kg/m   EKG 07/08/16: SB at 55 bpm, low voltage QRS.  Nuclear stress test 07/13/16:  Nuclear stress EF: 57%. No wall motion abnormalities  There was no ST segment deviation noted during stress. No perfusion defects.  Low risk study, no ischemia.  CXR 09/27/16: IMPRESSION: No change. 3 Spiration valve devices in the right infrahilar/perihilar region. Emphysema.  PFTs 06/15/16: FVC 4.37 (98% pre, 106% post), FEV1 1.75 (49% pre, 55% post), DLCO 11.6 (55%).  Preoperative labs noted. PT/INR 13.6/1.04. PTT 41 (he is not on heparin; previously PTT 39-41 1/12-08/04/16). Will defer decision to repeat PTT to surgeon (I did not plan to repeat since result is consistent with prior, and patient with multiple recent procedures.)  If no acute changes then I anticipate that he can proceed as planned.  George Hugh [facility] Short Stay Center/Anesthesiology Phone [phone] 09/27/2016 4:29 PM

## 2016-09-27 NOTE — Progress Notes (Addendum)
PTT results called to [facility]. Ebony Hail, PA-C called and informed of lab results.

## 2016-09-28 ENCOUNTER — Ambulatory Visit (HOSPITAL_COMMUNITY): Payer: Managed Care, Other (non HMO)

## 2016-09-28 ENCOUNTER — Ambulatory Visit (HOSPITAL_COMMUNITY): Payer: Managed Care, Other (non HMO) | Admitting: Certified Registered Nurse Anesthetist

## 2016-09-28 ENCOUNTER — Emergency Department (HOSPITAL_COMMUNITY)
Admission: EM | Admit: 2016-09-28 | Discharge: 2016-09-28 | Disposition: A | Discharge status: Home / Self Care | Payer: Managed Care, Other (non HMO) | Source: Home / Self Care | Attending: Emergency Medicine | Admitting: Emergency Medicine

## 2016-09-28 ENCOUNTER — Encounter (HOSPITAL_COMMUNITY): Payer: Self-pay | Admitting: *Deleted

## 2016-09-28 ENCOUNTER — Ambulatory Visit (HOSPITAL_COMMUNITY): Payer: Managed Care, Other (non HMO) | Admitting: Vascular Surgery

## 2016-09-28 ENCOUNTER — Ambulatory Visit (HOSPITAL_COMMUNITY)
Admission: RE | Admit: 2016-09-28 | Discharge: 2016-09-28 | Disposition: A | Discharge status: Home / Self Care | Payer: Managed Care, Other (non HMO) | Source: Ambulatory Visit | Attending: Cardiothoracic Surgery | Admitting: Cardiothoracic Surgery

## 2016-09-28 ENCOUNTER — Encounter (HOSPITAL_COMMUNITY)
Admission: RE | Disposition: A | Discharge status: Home / Self Care | Payer: Self-pay | Source: Ambulatory Visit | Attending: Cardiothoracic Surgery

## 2016-09-28 DIAGNOSIS — Z7951 Long term (current) use of inhaled steroids: Secondary | ICD-10-CM | POA: Insufficient documentation

## 2016-09-28 DIAGNOSIS — R339 Retention of urine, unspecified: Secondary | ICD-10-CM

## 2016-09-28 DIAGNOSIS — M1990 Unspecified osteoarthritis, unspecified site: Secondary | ICD-10-CM | POA: Diagnosis not present

## 2016-09-28 DIAGNOSIS — J95812 Postprocedural air leak: Secondary | ICD-10-CM | POA: Insufficient documentation

## 2016-09-28 DIAGNOSIS — Z87891 Personal history of nicotine dependence: Secondary | ICD-10-CM | POA: Insufficient documentation

## 2016-09-28 DIAGNOSIS — J449 Chronic obstructive pulmonary disease, unspecified: Secondary | ICD-10-CM | POA: Insufficient documentation

## 2016-09-28 DIAGNOSIS — Z09 Encounter for follow-up examination after completed treatment for conditions other than malignant neoplasm: Secondary | ICD-10-CM

## 2016-09-28 DIAGNOSIS — Z85118 Personal history of other malignant neoplasm of bronchus and lung: Secondary | ICD-10-CM

## 2016-09-28 DIAGNOSIS — J9382 Other air leak: Secondary | ICD-10-CM | POA: Diagnosis not present

## 2016-09-28 DIAGNOSIS — IMO0002 Reserved for concepts with insufficient information to code with codable children: Secondary | ICD-10-CM

## 2016-09-28 DIAGNOSIS — C3411 Malignant neoplasm of upper lobe, right bronchus or lung: Secondary | ICD-10-CM | POA: Diagnosis not present

## 2016-09-28 DIAGNOSIS — Z902 Acquired absence of lung [part of]: Secondary | ICD-10-CM

## 2016-09-28 HISTORY — PX: VIDEO BRONCHOSCOPY: SHX5072

## 2016-09-28 LAB — URINALYSIS, ROUTINE W REFLEX MICROSCOPIC
BILIRUBIN URINE: NEGATIVE
Glucose, UA: NEGATIVE mg/dL
Hgb urine dipstick: NEGATIVE
Ketones, ur: NEGATIVE mg/dL
Leukocytes, UA: NEGATIVE
NITRITE: NEGATIVE
Protein, ur: NEGATIVE mg/dL
SPECIFIC GRAVITY, URINE: 1.005 (ref 1.005–1.030)
pH: 6 (ref 5.0–8.0)

## 2016-09-28 SURGERY — BRONCHOSCOPY, VIDEO-ASSISTED
Anesthesia: General | Site: Chest

## 2016-09-28 MED ORDER — 0.9 % SODIUM CHLORIDE (POUR BTL) OPTIME
TOPICAL | Status: DC | PRN
  Administered 2016-09-28: 1000 mL

## 2016-09-28 MED ORDER — ONDANSETRON HCL 4 MG/2ML IJ SOLN
INTRAMUSCULAR | Status: AC
  Filled 2016-09-28: qty 2

## 2016-09-28 MED ORDER — FENTANYL CITRATE (PF) 100 MCG/2ML IJ SOLN
INTRAMUSCULAR | Status: DC | PRN
  Administered 2016-09-28 (×2): 50 ug via INTRAVENOUS

## 2016-09-28 MED ORDER — MUPIROCIN 2 % EX OINT
1.0000 "application " | TOPICAL_OINTMENT | Freq: Once | CUTANEOUS | Status: AC
  Administered 2016-09-28: 1 via TOPICAL
  Filled 2016-09-28: qty 22

## 2016-09-28 MED ORDER — VANCOMYCIN HCL IN DEXTROSE 750-5 MG/150ML-% IV SOLN
750.0000 mg | INTRAVENOUS | Status: DC
  Filled 2016-09-28: qty 150

## 2016-09-28 MED ORDER — FENTANYL CITRATE (PF) 250 MCG/5ML IJ SOLN
INTRAMUSCULAR | Status: AC
  Filled 2016-09-28: qty 5

## 2016-09-28 MED ORDER — EPHEDRINE SULFATE-NACL 50-0.9 MG/10ML-% IV SOSY
PREFILLED_SYRINGE | INTRAVENOUS | Status: DC | PRN
  Administered 2016-09-28: 5 mg via INTRAVENOUS

## 2016-09-28 MED ORDER — PROPOFOL 10 MG/ML IV BOLUS
INTRAVENOUS | Status: DC | PRN
  Administered 2016-09-28: 150 mg via INTRAVENOUS

## 2016-09-28 MED ORDER — ROCURONIUM BROMIDE 50 MG/5ML IV SOSY
PREFILLED_SYRINGE | INTRAVENOUS | Status: AC
  Filled 2016-09-28: qty 5

## 2016-09-28 MED ORDER — PHENYLEPHRINE HCL 10 MG/ML IJ SOLN
INTRAMUSCULAR | Status: DC | PRN
  Administered 2016-09-28: 25 ug/min via INTRAVENOUS

## 2016-09-28 MED ORDER — LACTATED RINGERS IV SOLN
INTRAVENOUS | Status: DC
  Administered 2016-09-28 (×2): via INTRAVENOUS

## 2016-09-28 MED ORDER — PHENYLEPHRINE 40 MCG/ML (10ML) SYRINGE FOR IV PUSH (FOR BLOOD PRESSURE SUPPORT)
PREFILLED_SYRINGE | INTRAVENOUS | Status: DC | PRN
  Administered 2016-09-28: 120 ug via INTRAVENOUS

## 2016-09-28 MED ORDER — PHENOL 1.4 % MT LIQD
1.0000 | OROMUCOSAL | Status: DC | PRN
  Administered 2016-09-28: 1 via OROMUCOSAL
  Filled 2016-09-28: qty 177

## 2016-09-28 MED ORDER — SUGAMMADEX SODIUM 200 MG/2ML IV SOLN
INTRAVENOUS | Status: DC | PRN
  Administered 2016-09-28: 200 mg via INTRAVENOUS

## 2016-09-28 MED ORDER — PROPOFOL 10 MG/ML IV BOLUS
INTRAVENOUS | Status: AC
  Filled 2016-09-28: qty 20

## 2016-09-28 MED ORDER — PHENYLEPHRINE 40 MCG/ML (10ML) SYRINGE FOR IV PUSH (FOR BLOOD PRESSURE SUPPORT)
PREFILLED_SYRINGE | INTRAVENOUS | Status: AC
  Filled 2016-09-28: qty 10

## 2016-09-28 MED ORDER — HYDROMORPHONE HCL 1 MG/ML IJ SOLN: 0.2500 mg | INTRAMUSCULAR | Status: DC | PRN

## 2016-09-28 MED ORDER — ONDANSETRON HCL 4 MG/2ML IJ SOLN
INTRAMUSCULAR | Status: DC | PRN
  Administered 2016-09-28: 4 mg via INTRAVENOUS

## 2016-09-28 MED ORDER — SUGAMMADEX SODIUM 200 MG/2ML IV SOLN
INTRAVENOUS | Status: AC
  Filled 2016-09-28: qty 2

## 2016-09-28 MED ORDER — ROCURONIUM BROMIDE 100 MG/10ML IV SOLN
INTRAVENOUS | Status: DC | PRN
  Administered 2016-09-28: 50 mg via INTRAVENOUS

## 2016-09-28 MED ORDER — LIDOCAINE 2% (20 MG/ML) 5 ML SYRINGE
INTRAMUSCULAR | Status: AC
  Filled 2016-09-28: qty 5

## 2016-09-28 MED ORDER — VANCOMYCIN HCL 1000 MG IV SOLR
INTRAVENOUS | Status: DC | PRN
  Administered 2016-09-28: 1000 mg via INTRAVENOUS

## 2016-09-28 SURGICAL SUPPLY — 19 items
BRUSH CYTOL CELLEBRITY 1.5X140 (MISCELLANEOUS) IMPLANT
CANISTER SUCT 3000ML PPV (MISCELLANEOUS) ×2 IMPLANT
CONT SPEC 4OZ CLIKSEAL STRL BL (MISCELLANEOUS) ×2 IMPLANT
COVER BACK TABLE 60X90IN (DRAPES) ×2 IMPLANT
FORCEPS BIOP RJ4 1.8 (CUTTING FORCEPS) IMPLANT
FORCEPS RADIAL JAW LRG 4 PULM (INSTRUMENTS) IMPLANT
GAUZE SPONGE 4X4 12PLY STRL (GAUZE/BANDAGES/DRESSINGS) ×2 IMPLANT
KIT CLEAN ENDO COMPLIANCE (KITS) ×2 IMPLANT
KIT ROOM TURNOVER OR (KITS) ×2 IMPLANT
MARKER SKIN DUAL TIP RULER LAB (MISCELLANEOUS) ×1 IMPLANT
NDL BIOPSY TRANSBRONCH 21G (NEEDLE) IMPLANT
NEEDLE BIOPSY TRANSBRONCH 21G (NEEDLE) IMPLANT
NS IRRIG 1000ML POUR BTL (IV SOLUTION) ×2 IMPLANT
OIL SILICONE PENTAX (PARTS (SERVICE/REPAIRS)) ×2 IMPLANT
RADIAL JAW LRG 4 PULMONARY (INSTRUMENTS) ×1
SYR 20ML ECCENTRIC (SYRINGE) ×2 IMPLANT
TOWEL OR 17X24 6PK STRL BLUE (TOWEL DISPOSABLE) ×2 IMPLANT
TRAP SPECIMEN MUCOUS 40CC (MISCELLANEOUS) IMPLANT
TUBE CONNECTING 20X1/4 (TUBING) ×2 IMPLANT

## 2016-09-28 NOTE — Anesthesia Postprocedure Evaluation (Signed)
Anesthesia Post Note  Patient: [name]  Procedure(s) Performed: Procedure(s) (LRB): VIDEO BRONCHOSCOPY, with general anesthesia for removal of interbronchial valves x 3 (N/A)  Patient location during evaluation: PACU Anesthesia Type: General Level of consciousness: awake Pain management: pain level controlled Vital Signs Assessment: post-procedure vital signs reviewed and stable Respiratory status: spontaneous breathing Cardiovascular status: stable Anesthetic complications: no       Last Vitals:  Vitals:   09/28/16 1005 09/28/16 1020  BP: 118/72 (!) 133/101  Pulse: 81 73  Resp: 13 17  Temp:      Last Pain:  Vitals:   09/28/16 1030  TempSrc:   PainSc: 0-No pain                 Taylie Helder

## 2016-09-28 NOTE — Brief Op Note (Signed)
      [address]       [place] [address]             [phone]      09/28/2016  10:00 AM  PATIENT:  [name]  66 y.o. 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   Delay start of Pharmacological VTE agent (>24hrs) due to surgical blood loss or risk of bleeding: yes

## 2016-09-28 NOTE — ED Notes (Signed)
Pt states, "he feels 100% better after foley placement."

## 2016-09-28 NOTE — Anesthesia Preprocedure Evaluation (Addendum)
Anesthesia Evaluation  Patient identified by MRN, date of birth, ID band Patient awake    Reviewed: Allergy & Precautions, NPO status , Patient's Chart, lab work & pertinent test results  Airway Mallampati: I  TM Distance: >3 FB Neck ROM: Full    Dental  (+) Edentulous Upper, Edentulous Lower   Pulmonary shortness of breath, pneumonia, COPD, former smoker,    breath sounds clear to auscultation       Cardiovascular negative cardio ROS   Rhythm:Regular Rate:Normal     Neuro/Psych  Headaches,    GI/Hepatic negative GI ROS, Neg liver ROS,   Endo/Other  negative endocrine ROS  Renal/GU negative Renal ROS     Musculoskeletal  (+) Arthritis ,   Abdominal   Peds  Hematology   Anesthesia Other Findings   Reproductive/Obstetrics                            Anesthesia Physical Anesthesia Plan  ASA: III  Anesthesia Plan: General   Post-op Pain Management:    Induction: Intravenous  Airway Management Planned: Oral ETT  Additional Equipment:   Intra-op Plan:   Post-operative Plan: Possible Post-op intubation/ventilation  Informed Consent: I have reviewed the patients History and Physical, chart, labs and discussed the procedure including the risks, benefits and alternatives for the proposed anesthesia with the patient or authorized representative who has indicated his/her understanding and acceptance.   Dental advisory given  Plan Discussed with: CRNA and Anesthesiologist  Anesthesia Plan Comments:         Anesthesia Quick Evaluation

## 2016-09-28 NOTE — ED Notes (Signed)
Per EDP, Foley Catheter is to be removed. Foley Catheter removed by Catalina Antigua, RN.

## 2016-09-28 NOTE — ED Notes (Signed)
Discharge instructions and follow up care reviewed with patient. Patient verbalized understanding. 

## 2016-09-28 NOTE — ED Provider Notes (Signed)
[facility] DEPT Provider Note   CSN: [id number] Arrival date & time: 09/28/16  1318     History   Chief Complaint Chief Complaint  Patient presents with  . Urinary Retention    HPI [name] is a 66 y.o. male.  Pt reports he [facility] today to remove a stent from his lung. Pt has a history of lung cancer with a resection.  Pt reports he has a history of difficulty urinating after procedures.  Pt states he has had this happen several times.  Pt reports his doctor treats him with flomax that works but he was told not to take it this am.  Pt reports he took at 11:00 am.  Pt talked to Dr. Roxy Horseman who advised him to come in for a cath after medication did not work.  Pt does not want to go home with foley.  He feels like he will be able to urinate on his own,  He understands he will need to return if he is not able to urinate.  Pt denies fever, no chills,    The history is provided by the patient. No language interpreter was used.  Dysuria      Past Medical History:  Diagnosis Date  . Adenocarcinoma of right lung, stage 1 ([place]) 09/26/2016  . Arthritis   . Cancer [facility])    pt states possible lung cancer to right lung  . COPD (chronic obstructive pulmonary disease) ([place])   . Dyspnea    with exertion  . Enlarged prostate   . Head injury    a. remotely with cranial plate in place.  Marland Kitchen Headache    neck cramps   . Lung mass    a. @ [facility] - 2.5 cm right upper lobe hypermetabolic mass highly suggestive of primary lung cancer.   . Pneumonia   . Tobacco abuse     Patient Active Problem List   Diagnosis Date Noted  . Adenocarcinoma of right lung, stage 1 ([place]) 09/26/2016  . S/P lobectomy of lung 07/19/2016  . Lung cancer ([place]) 07/07/2016  . Tobacco abuse 07/07/2016  . Arthritis 07/07/2016    Past Surgical History:  Procedure Laterality Date  . CARPAL TUNNEL RELEASE Right   . COLONOSCOPY    . ELBOW SURGERY     surgery x2 on right elbow  . EYE  SURGERY     eye socket fracture, metal plate on left side of face  . HAND SURGERY     multiple hand surgeries on right hand  . LOBECTOMY Right 07/19/2016   Procedure: LOBECTOMY RIGHT UPPER LOBE WITH WEDGE RESECTION SUPERIOR SEGMENT RIGHT LOWER LOBE WITH PLACEMENT OF ON-Q;  Surgeon: [name], MD;  Location: [facility];  Service: Thoracic;  Laterality: Right;  . LYMPH NODE DISSECTION Right 07/19/2016   Procedure: LYMPH NODE DISSECTION;  Surgeon: [name], MD;  Location: [facility];  Service: Thoracic;  Laterality: Right;  . SKIN GRAFT     to right hand   . VIDEO ASSISTED THORACOSCOPY Right 08/04/2016   Procedure: RIGHT VIDEO ASSISTED THORACOSCOPY WITH SUTURE CLOSURE OF AIR LEAK;  Surgeon: [name], MD;  Location: [facility];  Service: Thoracic;  Laterality: Right;  Marland Kitchen VIDEO ASSISTED THORACOSCOPY (VATS)/WEDGE RESECTION Right 07/19/2016   Procedure: VIDEO ASSISTED THORACOSCOPY (VATS);  Surgeon: [name], MD;  Location: [facility];  Service: Thoracic;  Laterality: Right;  Marland Kitchen VIDEO BRONCHOSCOPY N/A 07/19/2016   Procedure: VIDEO BRONCHOSCOPY;  Surgeon: Percell Miller  Maryruth Bun, MD;  Location: [facility];  Service: Thoracic;  Laterality: N/A;  . VIDEO BRONCHOSCOPY WITH INSERTION OF INTERBRONCHIAL VALVE (IBV) Right 08/01/2016   Procedure: VIDEO BRONCHOSCOPY WITH INSERTION OF INTERBRONCHIAL VALVE (IBV);  Surgeon: [name], MD;  Location: [facility];  Service: Thoracic;  Laterality: Right;  . WEDGE RESECTION Right 07/19/2016   Procedure: WEDGE  RESECTION RIGHT UPPER LOBE;  Surgeon: [name], MD;  Location: [facility];  Service: Thoracic;  Laterality: Right;       Home Medications    Prior to Admission medications   Medication Sig Start Date End Date Taking? Authorizing Provider  acetaminophen (EQL ARTHRITIS PAIN RELIEF) 650 MG CR tablet Take 650 mg by mouth every 8 (eight) hours as needed for pain.   Yes Historical Provider, MD  albuterol (PROVENTIL HFA;VENTOLIN HFA) 108 (90 Base) MCG/ACT  inhaler Inhale 2 puffs into the lungs every 6 (six) hours as needed for wheezing or shortness of breath.   Yes Historical Provider, MD  budesonide-formoterol (SYMBICORT) 160-4.5 MCG/ACT inhaler Inhale 2 puffs into the lungs 2 (two) times daily. 09/22/16  Yes [name], MD  tamsulosin [facility]) 0.4 MG CAPS capsule Take 1 capsule (0.4 mg total) by mouth daily after breakfast. 08/15/16  Yes Donielle Liston [place]ba, PA-C  tiotropium (SPIRIVA) 18 MCG inhalation capsule Place 18 mcg into inhaler and inhale daily.   Yes Historical Provider, MD  traMADol (ULTRAM) 50 MG tablet Take 1 tablet (50 mg total) by mouth every 6 (six) hours as needed for severe pain. 09/22/16  Yes [name], MD  levalbuterol [facility] HFA) 45 MCG/ACT inhaler Inhale 2 puffs into the lungs every 4 (four) hours as needed for shortness of breath. Patient not taking: Reported on 09/28/2016 09/22/16   [name], MD    Family History Family History  Problem Relation Age of Onset  . Aneurysm Mother     Brain  . Other Mother     Pacemaker  . Colon cancer Father   . Heart disease Father     CABG in his 71s, passed at 5  . Hypercholesterolemia Brother     Social History Social History  Substance Use Topics  . Smoking status: Former Smoker    Packs/day: 1.00    Years: 50.00    Types: Cigarettes    Quit date: 07/19/2016  . Smokeless tobacco: Never Used  . Alcohol use No     Allergies   Patient has no known allergies.   Review of Systems Review of Systems  Genitourinary: Positive for difficulty urinating. Negative for dysuria.  All other systems reviewed and are negative.    Physical Exam Updated Vital Signs BP (!) 135/91 (BP Location: Left Arm)   Pulse 94   Temp 97.8 F (36.6 C) (Oral)   Resp 18   SpO2 96%   Physical Exam  Constitutional: He appears well-developed and well-nourished.  HENT:  Head: Normocephalic and atraumatic.  Eyes: Conjunctivae are normal.  Cardiovascular: Normal rate  and regular rhythm.   No murmur heard. Pulmonary/Chest: Effort normal and breath sounds normal. No respiratory distress.  Abdominal: Soft. There is no tenderness.  Musculoskeletal: He exhibits no edema.  Neurological: He is alert.  Skin: Skin is warm and dry.  Psychiatric: He has a normal mood and affect.  Nursing note and vitals reviewed.    ED Treatments / Results  Labs (all labs ordered are listed, but only abnormal results are displayed) Labs Reviewed  URINALYSIS, [place]  MICROSCOPIC - Abnormal; Notable for the following:       Result Value   Color, Urine STRAW (*)    All other components within normal limits    EKG  EKG Interpretation None       Radiology Dg Chest 2 View  Result Date: 09/28/2016 CLINICAL DATA:  Post bronchoscopy, right upper lobe lung carcinoma EXAM: CHEST  2 VIEW COMPARISON:  Chest x-ray of 09/27/2016 FINDINGS: The lungs remain very hyperaerated with postoperative changes stable on the right with some volume loss. The previously noticed by spiration intrabronchial valves appear to have been removed. Surgical sutures overlie the right hilum, lying anteriorly on the lateral view. No pneumonia or effusion is seen. No pneumothorax is noted. The heart is unchanged in size. No bony abnormality is seen. IMPRESSION: 1. Apparent interval removal of spiration intrabronchial valves. No complicating features. 2. Emphysema. 3. Stable postoperative changes on the right. Electronically Signed   By: Ivar Drape M.D.   On: 09/28/2016 11:48   Dg Chest 2 View  Result Date: 09/27/2016 CLINICAL DATA:  Preoperative exam for right lung bowel for a mobile. EXAM: CHEST  2 VIEW COMPARISON:  09/22/2016 FINDINGS: Heart size is normal. Chronic emphysema as seen previously. Device opacities consistent with 3 Spiration airway valve devices appear in the same position in the right infrahilar/perihilar region. No unexpected change. No active process. IMPRESSION: No change. 3  Spiration valve devices in the right infrahilar/perihilar region. Emphysema. Electronically Signed   By: Nelson Chimes M.D.   On: 09/27/2016 15:37    Procedures Procedures (including critical care time)  Medications Ordered in ED Medications - No data to display   Initial Impression / Assessment and Plan / ED Course  I have reviewed the triage vital signs and the nursing notes.  Pertinent labs & imaging results that were available during my care of the patient were reviewed by me and considered in my medical decision making (see chart for details).       Final Clinical Impressions(s) / ED Diagnoses   Final diagnoses:  Urinary retention    New Prescriptions New Prescriptions   No medications on file  An After Visit Summary was printed and given to the patient.   Hollace Kinnier [place], PA-C 09/28/16 1544    Julianne Rice, MD 09/29/16 1524

## 2016-09-28 NOTE — ED Notes (Signed)
Bed: [facility]PT3 Expected date:  Expected time:  Means of arrival:  Comments: 

## 2016-09-28 NOTE — Transfer of Care (Signed)
Immediate Anesthesia Transfer of Care Note  Patient: [name]  Procedure(s) Performed: Procedure(s): VIDEO BRONCHOSCOPY, with general anesthesia for removal of interbronchial valves x 3 (N/A)  Patient Location: PACU  Anesthesia Type:General  Level of Consciousness: awake, alert  and oriented  Airway & Oxygen Therapy: Patient Spontanous Breathing and Patient connected to nasal cannula oxygen  Post-op Assessment: Report given to RN and Post -op Vital signs reviewed and stable  Post vital signs: Reviewed and stable  Last Vitals:  Vitals:   09/28/16 0750 09/28/16 0950  BP: (!) 153/74 103/67  Pulse: 64 78  Resp: 18 14  Temp: 36.7 C 36.7 C    Last Pain:  Vitals:   09/28/16 0809  TempSrc:   PainSc: 3          Complications: No apparent anesthesia complications

## 2016-09-28 NOTE — Op Note (Signed)
NAMEQUANTE, PETTRY NO.:  [id number]  MEDICAL RECORD NO.:  [id number]  LOCATION:                                 FACILITY:  PHYSICIAN:  [name], MD    DATE OF BIRTH:  April 10, 1951  DATE OF PROCEDURE:  09/28/2016 DATE OF DISCHARGE:                              OPERATIVE REPORT   PREOPERATIVE DIAGNOSIS:  Previous postoperative prolonged air leak with intrabronchial valves in place.  POSTOPERATIVE DIAGNOSIS:  Previous postoperative prolonged air leak with intrabronchial valves in place.  SURGICAL PROCEDURE:  Bronchoscopy with removal of intrabronchial valves x3.  SURGEON:  [name], MD  BRIEF HISTORY:  The patient is a 66 year old male, who had undergone right upper lobectomy, ultimately was a stage I non-small cell lung cancer in January.  His postop course was complicated by prolonged air leak, three intrabronchial pulmonary valves have been placed about 8 weeks previously to help treat the postop air leak.  The patient has now recovered, chest tubes had been removed, and his postop chest x-ray is stable.  We recommended removal of the bronchial valves.  The patient agreed and signed informed consent.  DESCRIPTION OF PROCEDURE:  The patient underwent general endotracheal anesthesia without incident.  Appropriate time-out was performed.  We then proceeded with bronchoscopy.  The left tracheobronchial tree was examined, and was free of any lesions.  The right tracheobronchial tree was examined.  The takeoff of right upper lobe bronchus, which had been previously resected was well healed.  We were able to easily identify the three bronchial valves, two in the middle lobe and one in the superior segment of the lower lobe.  With the fiberoptic bronchoscope and biopsy forceps, each of the three bronchial valves were extracted without difficulty.  There was minimal bleeding.  The patient tolerated the procedure without obvious complication.  He  was extubated in the operating room and transferred to the recovery room for postoperative care.     [name], MD   ______________________________ [name], MD    EG/MEDQ  D:  09/28/2016  T:  09/28/2016  Job:  [id number]

## 2016-09-28 NOTE — Anesthesia Procedure Notes (Signed)
Procedure Name: Intubation Date/Time: 09/28/2016 9:06 AM Performed by: Trixie Deis A Pre-anesthesia Checklist: Patient identified, Emergency Drugs available, Suction available and Patient being monitored Patient Re-evaluated:Patient Re-evaluated prior to inductionOxygen Delivery Method: Circle System Utilized Preoxygenation: Pre-oxygenation with 100% oxygen Intubation Type: IV induction Ventilation: Mask ventilation without difficulty Laryngoscope Size: Mac and 4 Grade View: Grade I Tube type: Oral Tube size: 8.5 mm Number of attempts: 1 Airway Equipment and Method: Stylet and Oral airway Placement Confirmation: ETT inserted through vocal cords under direct vision,  positive ETCO2 and breath sounds checked- equal and bilateral Secured at: 23 cm Tube secured with: Tape Dental Injury: Teeth and Oropharynx as per pre-operative assessment

## 2016-09-28 NOTE — Op Note (Deleted)
  The note originally documented on this encounter has been moved the the encounter in which it belongs.  

## 2016-09-28 NOTE — Discharge Instructions (Signed)
Return if any problems.

## 2016-09-28 NOTE — H&P (Signed)
[address]       [place], [address]             [phone]        [name] [name] #[id number] Date of Birth: 1950-12-19  Referring: No ref. provider found Primary Care: Pcp Not In System  Chief Complaint:   Removal of IBV valves   History of Present Illness:     pateint returns now fro removal of ibv valves placed after air leak from right upper lobectomy in January. For Cancer Staging Adenocarcinoma of right lung, stage 1 (HCC) Staging form: Lung, AJCC 8th Edition - Clinical stage from 09/26/2016: Stage IA2 (cT1b, cN0, cM0) - Signed by Curt Bears, MD on 09/26/2016  Lung cancer [facility]) Staging form: Lung, AJCC 8th Edition - Pathologic stage from 07/22/2016: Stage IA3 (pT1c, pN0, cM0) - Signed by [name], MD on 07/22/2016    Current Activity/ Functional Status: Patient is independent with mobility/ambulation, transfers, ADL's, IADL's.   Zubrod Score: At the time of surgery this patient's most appropriate activity status/level should be described as: '[]'$     0    Normal activity, no symptoms '[x]'$     1    Restricted in physical strenuous activity but ambulatory, able to do out light work '[]'$     2    Ambulatory and capable of self care, unable to do work activities, up and about                 more than 50%  Of the time                            '[]'$     3    Only limited self care, in bed greater than 50% of waking hours '[]'$     4    Completely disabled, no self care, confined to bed or chair '[]'$     5    Moribund  Past Medical History:  Diagnosis Date  . Adenocarcinoma of right lung, stage 1 ([place]) 09/26/2016  . Arthritis   . Cancer [facility])    pt states possible lung cancer to right lung  . COPD (chronic obstructive pulmonary disease) ([place])   . Dyspnea    with exertion  . Enlarged prostate   . Head injury    a. remotely with cranial plate in place.  Marland Kitchen Headache    neck cramps   . Lung mass    a. @ [facility] - 2.5 cm right  upper lobe hypermetabolic mass highly suggestive of primary lung cancer.   . Pneumonia   . Tobacco abuse     Past Surgical History:  Procedure Laterality Date  . CARPAL TUNNEL RELEASE Right   . COLONOSCOPY    . ELBOW SURGERY     surgery x2 on right elbow  . EYE SURGERY     eye socket fracture, metal plate on left side of face  . HAND SURGERY     multiple hand surgeries on right hand  . LOBECTOMY Right 07/19/2016   Procedure: LOBECTOMY RIGHT UPPER LOBE WITH WEDGE RESECTION SUPERIOR SEGMENT RIGHT LOWER LOBE WITH PLACEMENT OF ON-Q;  Surgeon: [name], MD;  Location: [place];  Service: Thoracic;  Laterality: Right;  . LYMPH NODE DISSECTION Right 07/19/2016   Procedure: LYMPH NODE DISSECTION;  Surgeon: [name], MD;  Location: [place];  Service: Thoracic;  Laterality: Right;  .  SKIN GRAFT     to right hand   . VIDEO ASSISTED THORACOSCOPY Right 08/04/2016   Procedure: RIGHT VIDEO ASSISTED THORACOSCOPY WITH SUTURE CLOSURE OF AIR LEAK;  Surgeon: [name], MD;  Location: [place];  Service: Thoracic;  Laterality: Right;  Marland Kitchen VIDEO ASSISTED THORACOSCOPY (VATS)/WEDGE RESECTION Right 07/19/2016   Procedure: VIDEO ASSISTED THORACOSCOPY (VATS);  Surgeon: [name], MD;  Location: [place];  Service: Thoracic;  Laterality: Right;  Marland Kitchen VIDEO BRONCHOSCOPY N/A 07/19/2016   Procedure: VIDEO BRONCHOSCOPY;  Surgeon: [name], MD;  Location: [facility] OR;  Service: Thoracic;  Laterality: N/A;  . VIDEO BRONCHOSCOPY WITH INSERTION OF INTERBRONCHIAL VALVE (IBV) Right 08/01/2016   Procedure: VIDEO BRONCHOSCOPY WITH INSERTION OF INTERBRONCHIAL VALVE (IBV);  Surgeon: [name], MD;  Location: [place];  Service: Thoracic;  Laterality: Right;  . WEDGE RESECTION Right 07/19/2016   Procedure: WEDGE  RESECTION RIGHT UPPER LOBE;  Surgeon: [name], MD;  Location: [place];  Service: Thoracic;  Laterality: Right;    History  Smoking Status  . Former Smoker  . Packs/day: 1.00  . Years:  50.00  . Types: Cigarettes  . Quit date: 07/19/2016  Smokeless Tobacco  . Never Used    History  Alcohol Use No    Social History   Social History  . Marital status: Married    Spouse name: N/A  . Number of children: N/A  . Years of education: N/A   Occupational History  . Not on file.   Social History Main Topics  . Smoking status: Former Smoker    Packs/day: 1.00    Years: 50.00    Types: Cigarettes    Quit date: 07/19/2016  . Smokeless tobacco: Never Used  . Alcohol use No  . Drug use: No  . Sexual activity: Not on file   Other Topics Concern  . Not on file   Social History Narrative  . No narrative on file    No Known Allergies  Current Facility-Administered Medications  Medication Dose Route Frequency Provider Last Rate Last Dose  . lactated ringers infusion   Intravenous Continuous Belinda Block, MD 50 mL/hr at 09/28/16 6979      Prescriptions Prior to Admission  Medication Sig Dispense Refill Last Dose  . acetaminophen (EQL ARTHRITIS PAIN RELIEF) 650 MG CR tablet Take 650 mg by mouth every 8 (eight) hours as needed for pain.   09/27/2016 at Unknown time  . albuterol (PROVENTIL HFA;VENTOLIN HFA) 108 (90 Base) MCG/ACT inhaler Inhale 2 puffs into the lungs every 6 (six) hours as needed for wheezing or shortness of breath.   09/27/2016 at Unknown time  . budesonide-formoterol (SYMBICORT) 160-4.5 MCG/ACT inhaler Inhale 2 puffs into the lungs 2 (two) times daily. 1 Inhaler 3 09/27/2016 at Unknown time  . levalbuterol (XOPENEX HFA) 45 MCG/ACT inhaler Inhale 2 puffs into the lungs every 4 (four) hours as needed for shortness of breath. 1 Inhaler 1 Taking  . tamsulosin (FLOMAX) 0.4 MG CAPS capsule Take 1 capsule (0.4 mg total) by mouth daily after breakfast. 30 capsule 1 09/27/2016 at Unknown time  . traMADol (ULTRAM) 50 MG tablet Take 1 tablet (50 mg total) by mouth every 6 (six) hours as needed for severe pain. 30 tablet 0 09/27/2016 at Unknown time    Family History   Problem Relation Age of Onset  . Aneurysm Mother     Brain  . Other Mother     Pacemaker  . Colon cancer Father   .  Heart disease Father     CABG in his 56s, passed at 32  . Hypercholesterolemia Brother      Review of Systems:      Cardiac Review of Systems: Y or N  Chest Pain [ n   ]  Resting SOB [n   ] Exertional SOB  Blue.Reese  ]  Pontianus.Latina [  ]   Pedal Edema [   ]    Palpitations [  ] Syncope  [  ]   Presyncope [   ]  General Review of Systems: [Y] = yes [  ]=no Constitional: recent weight change [  ]; anorexia [  ]; fatigue [  ]; nausea [  ]; night sweats [  ]; fever [  ]; or chills [  ]                                                               Dental: poor dentition[  ]; Last Dentist visit:   Eye : blurred vision [  ]; diplopia [   ]; vision changes [  ];  Amaurosis fugax[  ]; Resp: cough [  ];  wheezing[  ];  hemoptysis[  ]; shortness of breath[  ]; paroxysmal nocturnal dyspnea[  ]; dyspnea on exertion[  ]; or orthopnea[  ];  GI:  gallstones[  ], vomiting[  ];  dysphagia[  ]; melena[  ];  hematochezia [  ]; heartburn[  ];   Hx of  Colonoscopy[  ]; GU: kidney stones [  ]; hematuria[  ];   dysuria [  ];  nocturia[  ];  history of     obstruction [  ]; urinary frequency [  ]             Skin: rash, swelling[  ];, hair loss[  ];  peripheral edema[  ];  or itching[  ]; Musculosketetal: myalgias[  ];  joint swelling[  ];  joint erythema[  ];  joint pain[  ];  back pain[  ];  Heme/Lymph: bruising[  ];  bleeding[  ];  anemia[  ];  Neuro: TIA[  ];  headaches[  ];  stroke[  ];  vertigo[  ];  seizures[  ];   paresthesias[  ];  difficulty walking[  ];  Psych:depression[  ]; anxiety[  ];  Endocrine: diabetes[  ];  thyroid dysfunction[  ];  Immunizations: Flu [  ]; Pneumococcal[  ];  Other:  Physical Exam: BP (!) 153/74   Pulse 64   Temp 98 F (36.7 C) (Oral)   Resp 18   Wt 153 lb (69.4 kg)   SpO2 98%   BMI 20.75 kg/m    General appearance: alert, cooperative and no  distress Head: Normocephalic, without obvious abnormality, atraumatic Neck: no adenopathy, no carotid bruit, no JVD, supple, symmetrical, trachea midline and thyroid not enlarged, symmetric, no tenderness/mass/nodules Lymph nodes: Cervical, supraclavicular, and axillary nodes normal. Resp: clear to auscultation bilaterally Back: symmetric, no curvature. ROM normal. No CVA tenderness. Cardio: regular rate and rhythm, S1, S2 normal, no murmur, click, rub or gallop GI: soft, non-tender; bowel sounds normal; no masses,  no organomegaly Extremities: extremities normal, atraumatic, no cyanosis or edema and Homans sign is negative, no sign of DVT Neurologic: Grossly normal  Diagnostic Studies &  Laboratory data:     Recent Radiology Findings:   Dg Chest 2 View  Result Date: 09/27/2016 CLINICAL DATA:  Preoperative exam for right lung bowel for a mobile. EXAM: CHEST  2 VIEW COMPARISON:  09/22/2016 FINDINGS: Heart size is normal. Chronic emphysema as seen previously. Device opacities consistent with 3 Spiration airway valve devices appear in the same position in the right infrahilar/perihilar region. No unexpected change. No active process. IMPRESSION: No change. 3 Spiration valve devices in the right infrahilar/perihilar region. Emphysema. Electronically Signed   By: Nelson Chimes M.D.   On: 09/27/2016 15:37     I have independently reviewed the above radiologic studies.  Recent Lab Findings: Lab Results  Component Value Date   WBC 8.0 09/27/2016   HGB 13.2 09/27/2016   HCT 40.9 09/27/2016   PLT 220 09/27/2016   GLUCOSE 120 (H) 09/27/2016   CHOL 243 (H) 07/11/2016   TRIG 102 07/11/2016   HDL 75 07/11/2016   LDLCALC 148 (H) 07/11/2016   ALT 22 09/27/2016   AST 23 09/27/2016   NA 138 09/27/2016   K 3.6 09/27/2016   CL 103 09/27/2016   CREATININE 0.92 09/27/2016   BUN 5 (L) 09/27/2016   CO2 24 09/27/2016   INR 1.04 09/27/2016      Assessment / Plan:      Plan removal of IBV valves    The goals risks and alternatives of the planned surgical procedure Procedure(s): VIDEO BRONCHOSCOPY, with general anesthesia for removal of interbronchial valves x 3 (N/A)  have been discussed with the patient in detail. The risks of the procedure including death, infection, stroke, myocardial infarction, bleeding, blood transfusion have all been discussed specifically.  I have quoted Kelby Fam a 1 % of perioperative mortality and a complication rate as high as 10%. The patient's questions have been answered.VAUGHAN GARFINKLE is willing  to proceed with the planned procedure.  [name] MD      [address] [place] [address] Office [phone]   Beeper [phone]  09/28/2016 8:24 AM

## 2016-09-28 NOTE — ED Triage Notes (Signed)
Pt complains or urinary retention. Pt last urinated this morning at 7AM. Pt states he was unable to take his flomax today due to having surgery earlier today. Pt has been on flomax for prostate problems for 3 months. Pt states he feels like his blaster is about to "burst"

## 2016-09-29 ENCOUNTER — Encounter (HOSPITAL_COMMUNITY): Payer: Self-pay | Admitting: Cardiothoracic Surgery

## 2016-09-29 ENCOUNTER — Other Ambulatory Visit: Payer: Self-pay | Admitting: *Deleted

## 2016-09-29 DIAGNOSIS — M542 Cervicalgia: Secondary | ICD-10-CM

## 2016-09-29 DIAGNOSIS — M25512 Pain in left shoulder: Secondary | ICD-10-CM

## 2016-10-06 ENCOUNTER — Other Ambulatory Visit: Payer: Self-pay | Admitting: Cardiothoracic Surgery

## 2016-10-10 ENCOUNTER — Ambulatory Visit
Admission: RE | Admit: 2016-10-10 | Discharge: 2016-10-10 | Disposition: A | Discharge status: Home / Self Care | Payer: Managed Care, Other (non HMO) | Source: Ambulatory Visit | Attending: Cardiothoracic Surgery | Admitting: Cardiothoracic Surgery

## 2016-10-10 DIAGNOSIS — C3411 Malignant neoplasm of upper lobe, right bronchus or lung: Secondary | ICD-10-CM

## 2016-10-10 DIAGNOSIS — M542 Cervicalgia: Secondary | ICD-10-CM

## 2016-10-10 MED ORDER — GADOBENATE DIMEGLUMINE 529 MG/ML IV SOLN
14.0000 mL | Freq: Once | INTRAVENOUS | Status: AC | PRN
  Administered 2016-10-10: 14 mL via INTRAVENOUS

## 2016-10-12 ENCOUNTER — Other Ambulatory Visit: Payer: Self-pay | Admitting: *Deleted

## 2016-10-12 ENCOUNTER — Encounter: Payer: Self-pay | Admitting: Cardiothoracic Surgery

## 2016-10-12 ENCOUNTER — Other Ambulatory Visit: Payer: Self-pay | Admitting: Cardiothoracic Surgery

## 2016-10-12 ENCOUNTER — Ambulatory Visit (INDEPENDENT_AMBULATORY_CARE_PROVIDER_SITE_OTHER): Payer: Managed Care, Other (non HMO) | Admitting: Physician Assistant

## 2016-10-12 VITALS — BP 123/76 | HR 90 | Resp 20 | Ht 72.0 in | Wt 153.0 lb

## 2016-10-12 DIAGNOSIS — C799 Secondary malignant neoplasm of unspecified site: Principal | ICD-10-CM

## 2016-10-12 DIAGNOSIS — C3411 Malignant neoplasm of upper lobe, right bronchus or lung: Secondary | ICD-10-CM

## 2016-10-12 DIAGNOSIS — J95812 Postprocedural air leak: Secondary | ICD-10-CM

## 2016-10-12 DIAGNOSIS — Z902 Acquired absence of lung [part of]: Secondary | ICD-10-CM

## 2016-10-12 DIAGNOSIS — C3491 Malignant neoplasm of unspecified part of right bronchus or lung: Secondary | ICD-10-CM

## 2016-10-12 DIAGNOSIS — Z09 Encounter for follow-up examination after completed treatment for conditions other than malignant neoplasm: Secondary | ICD-10-CM

## 2016-10-12 MED ORDER — TAMSULOSIN HCL 0.4 MG PO CAPS: 0.4000 mg | ORAL_CAPSULE | Freq: Every day | ORAL | 0 refills | Status: DC

## 2016-10-12 MED ORDER — TRAMADOL HCL 50 MG PO TABS: 50.0000 mg | ORAL_TABLET | Freq: Two times a day (BID) | ORAL | 0 refills | Status: DC | PRN

## 2016-10-12 NOTE — Patient Instructions (Signed)
Mr. Feil is to return to our building on 10/14/2016 at 1:30 PM for his CT of the neck. We will arrange an appointment for him to come back to the office early next week for Dr. Servando Snare to review these results with him.  He is to set up a primary care appointment at the urgent care that he has previously gone to. I provided him with a month's supply of Flomax until he can arrange an appointment.  As far as returning to work, he will be able to return to work as soon as Dr. Servando Snare to clears him to do so. Right now as it stands he is not cleared to return to work. Specific limitations will be discussed with the patient at the time of clearance.

## 2016-10-12 NOTE — Progress Notes (Signed)
[address]       [place] [address]             [phone]      Bastien T Davids [facility] Medical Record #[id number] te of Birth: 07-30-1950 Da Referring: [name], MD Primary Care: No PCP Per Patient  Chief Complaint:   POST OP FOLLOW UP  07/19/2016  OPERATIVE REPORT  PREOPERATIVE DIAGNOSIS:  Right upper lobe lung mass. POSTOPERATIVE DIAGNOSIS:  Right upper lobe lung mass. SURGICAL PROCEDURE:  Video bronchoscopy, right video-assisted thoracoscopy, mini thoracotomy, wedge resection of right upper lobe and superior segment of right lower lobe with completion right upper lobectomy, lymph node dissection, and placement of On-Q device. SURGEON:  [name], MD.  08/01/2016 DATE OF DISCHARGE:  OPERATIVE REPORT PREOPERATIVE DIAGNOSIS:  Persistent air leak following right upper lobectomy. POSTOP DIAGNOSIS:  Persistent air leak following right upper lobectomy. SURGICAL PROCEDURE:  Bronchoscopy and placement of Spiration intrabronchial valves x3 in the superior segment of the right lower lobe, in the medial and lateral branches of the right middle lobe. SURGEON:  [name], MD.  08/04/2016 PERATIVE REPORT PREOPERATIVE DIAGNOSIS:  Persistent postoperative air leak. POSTOPERATIVE DIAGNOSIS:  Persistent postoperative air leak. SURGICAL PROCEDURE:  Reoperation with right video-assisted thoracoscopy for persistent air leak with oversewing of the right lower lobe air leak. SURGEON:  [name], MD.  Cancer Staging Adenocarcinoma of right lung, stage 1 [facility]) Staging form: Lung, AJCC 8th Edition - Clinical stage from 09/26/2016: Stage IA2 (cT1b, cN0, cM0) - Signed by Curt Bears, MD on 09/26/2016  Lung cancer [facility]) Staging form: Lung, AJCC 8th Edition - Pathologic stage from 07/22/2016: Stage IA3 (pT1c, pN0, cM0) - Signed by [name], MD on 07/22/2016  History of Present Illness:     Patient returns to the office  today for follow-up after his protracted course with persistent air leak after right upper lobectomy for stage I a carcinoma of the lung. The patient is improving, his appetite is better, he's gaining weight, he remains off cigarettes. He has continued to need some pain medicine but this need is been decreasing, he notes she's taking pain medication just at night currently.  He would like to discuss his oncology situation medical oncology, but I suspect with his Ia disease follow-up chemotherapy would not be indicated. Has appointment early next week at the [place].    Past Medical History:  Diagnosis Date  . Adenocarcinoma of right lung, stage 1 ([place]) 09/26/2016  . Arthritis   . Cancer [facility])    pt states possible lung cancer to right lung  . COPD (chronic obstructive pulmonary disease) ([place])   . Dyspnea    with exertion  . Enlarged prostate   . Head injury    a. remotely with cranial plate in place.  Marland Kitchen Headache    neck cramps   . Lung mass    a. @ [facility] - 2.5 cm right upper lobe hypermetabolic mass highly suggestive of primary lung cancer.   . Pneumonia   . Tobacco abuse      History  Smoking Status  . Former Smoker  . Packs/day: 1.00  . Years: 50.00  . Types: Cigarettes  . Quit date: 07/19/2016  Smokeless Tobacco  . Never Used    History  Alcohol Use No     No Known Allergies  Current Outpatient Prescriptions  Medication Sig Dispense Refill  . acetaminophen (EQL ARTHRITIS PAIN RELIEF) 650 MG CR tablet  Take 650 mg by mouth every 8 (eight) hours as needed for pain.    Marland Kitchen albuterol (PROVENTIL HFA;VENTOLIN HFA) 108 (90 Base) MCG/ACT inhaler Inhale 2 puffs into the lungs every 6 (six) hours as needed for wheezing or shortness of breath.    . budesonide-formoterol (SYMBICORT) 160-4.5 MCG/ACT inhaler Inhale 2 puffs into the lungs 2 (two) times daily. 1 Inhaler 3  . levalbuterol (XOPENEX HFA) 45 MCG/ACT inhaler Inhale 2 puffs into the lungs every 4 (four) hours as needed  for shortness of breath. 1 Inhaler 1  . tamsulosin (FLOMAX) 0.4 MG CAPS capsule Take 1 capsule (0.4 mg total) by mouth daily after breakfast. 30 capsule 0  . tiotropium (SPIRIVA) 18 MCG inhalation capsule Place 18 mcg into inhaler and inhale daily.    . traMADol (ULTRAM) 50 MG tablet Take 1 tablet (50 mg total) by mouth every 12 (twelve) hours as needed for severe pain. 30 tablet 0   No current facility-administered medications for this visit.        Physical Exam: BP 123/76   Pulse 90   Resp 20   Ht 6' (1.829 m)   Wt 69.4 kg (153 lb)   SpO2 96% Comment: RA  BMI 20.75 kg/m   General appearance: alert and cooperative Neurologic: intact Heart: regular rate and rhythm, S1, S2 normal, no murmur, click, rub or gallop Lungs: clear to auscultation bilaterally Abdomen: soft, non-tender; bowel sounds normal; no masses,  no organomegaly Extremities: extremities normal, atraumatic, no cyanosis or edema and Homans sign is negative, no sign of DVT Wound: His incisions and chest tube sites are healing well without evidence of infection   Diagnostic Studies & Laboratory data:     Recent Radiology Findings:   Mr Cervical Spine W Wo Contrast  Result Date: 10/10/2016 CLINICAL DATA:  Neck pain and stiffness.  Lung cancer. EXAM: MRI CERVICAL SPINE WITHOUT AND WITH CONTRAST TECHNIQUE: Multiplanar and multiecho pulse sequences of the cervical spine, to include the craniocervical junction and cervicothoracic junction, were obtained without and with intravenous contrast. CONTRAST:  71m MULTIHANCE GADOBENATE DIMEGLUMINE 529 MG/ML IV SOLN COMPARISON:  PET CT 06/22/2016 FINDINGS: Alignment: Grade 1 C4-C5 anterolisthesis. Vertebrae: There is a low T1/high T2 weighted signal within the superior aspect of the dens, with associated enhancement on postcontrast images, with a linear inferior margin. The aspect to the right of midline within the C2 body is round with more peripheral contrast enhancement. This is  visualized on sagittal images only. There is no other focal marrow signal abnormality or contrast enhancement. Cord: Normal signal and caliber. Posterior Fossa, vertebral arteries, paraspinal tissues: Visualized posterior fossa is normal. Vertebral artery flow voids are preserved. Normal visualized paraspinal soft tissues. Disc levels: C1-C2: Normal. C2-C3: Normal disc space and facets. No spinal canal or neuroforaminal stenosis. C3-C4: Normal disc space and facets. No spinal canal or neuroforaminal stenosis. C4-C5: Left-greater-than-right facet hypertrophy and grade 1 anterolisthesis. No spinal canal stenosis. No nerve root impingement. C5-C6: Left-greater-than-right facet and uncovertebral hypertrophy without spinal canal or neural foraminal stenosis. C6-C7: Normal disc space and facets. No spinal canal or neuroforaminal stenosis. C7-T1: Normal disc space and facets. No spinal canal or neuroforaminal stenosis. IMPRESSION: 1. T2 hyperintense, contrast-enhancing area within the superior aspect of the C2 vertebral body. In the context of a patient with known lung cancer, this is most consistent with a focus of metastatic disease. However, the distribution of the enhancement is somewhat atypical. A superimposed pathologic fracture would be difficult to exclude. CT of the  cervical spine without contrast is recommended for more complete assessment of the vertebral body cortex. 2. No spinal canal or neural foraminal stenosis. 3. Grade 1 anterolisthesis at C4-C5 secondary to facet hypertrophy. Electronically Signed   By: Ulyses Jarred M.D.   On: 10/10/2016 22:22      Recent Lab Findings: Lab Results  Component Value Date   WBC 8.0 09/27/2016   HGB 13.2 09/27/2016   HCT 40.9 09/27/2016   PLT 220 09/27/2016   GLUCOSE 120 (H) 09/27/2016   CHOL 243 (H) 07/11/2016   TRIG 102 07/11/2016   HDL 75 07/11/2016   LDLCALC 148 (H) 07/11/2016   ALT 22 09/27/2016   AST 23 09/27/2016   NA 138 09/27/2016   K 3.6  09/27/2016   CL 103 09/27/2016   CREATININE 0.92 09/27/2016   BUN 5 (L) 09/27/2016   CO2 24 09/27/2016   INR 1.04 09/27/2016      Assessment / Plan:      Patient making postoperative progress after complicated right upper lobe resection for stage I non-small cell carcinoma, with his postop course complicated by persistent air leak.   MRI of the neck showed an area that was hyperintense and contrast-enhancing which could be consistent with metastatic disease. However, the radiologist recommended a CT of the cervical spine without contrast for a more complete assessment of the vertebral body cortex. His CT of the neck is scheduled for Friday, 10/14/2016 at 1:30 PM. At this time I will discuss the results of the CT scan with Dr. Servando Snare. The patient is scheduled to follow-up with one of the PAs on Monday, 10/17/2016. The MRI was discussed with Dr. Servando Snare and he agreed with this plan.  We also discussed issues of urinary retention. The patient states that he went to the emergency department a few weeks ago for urinary retention and had been given Flomax while in the hospital. He is now out of his Flomax prescription and shares that he is in dire need of a refill. Otherwise, he would have to go back to the emergency department for therapy. I discussed refilling Flomax this time for 1 month to give him enough time to make an appointment with his primary care provider. He states that he does go to an urgent care center on [address], however he cannot remember the providers name. He is to get his Flomax refills from her primary care provider from this point on. I discussed that complete care is the job of the primary care provider and we are here to provide for his cardiac and thoracic needs.  He is becoming anxious about returning to work. He works at [employer] which requires heavy lifting (up 150 pounds). I documented in his paperwork today that he is not to return to work until he gets a  physician's clearance. This will be up to Dr. Servando Snare. There is no light duty at his job.   We discussed smoking cessation. He currently has tried patches, gum, and other smoking cessation techniques. He shares that is extremely difficult. He did confess to using vapor cigarettes that do not have any nicotine. I shared that this is not the best alternative, however is likely better than cigarettes.   I did refill his tramadol prescription today. I gave him 30 pills. He shares that he is only taking 1 tramadol at night at this point. Therefore, the prescriptions should last him all month. I shared that he is to wean himself off this medication since it  can be addicting. When he returns next week we will discuss cutting the pills in half to further wean him off this medication.    We will see Mr. Dike on Monday and our office following his CT of the neck. If he has any questions or concerns before that time he is welcome to call our office.        [address] [place] [address] Office [phone]   Beeper [phone]    Nicholes Rough, PA-C     Subjective Objective Assessment & Plan

## 2016-10-14 ENCOUNTER — Ambulatory Visit
Admission: RE | Admit: 2016-10-14 | Discharge: 2016-10-14 | Disposition: A | Discharge status: Home / Self Care | Payer: Managed Care, Other (non HMO) | Source: Ambulatory Visit | Attending: Cardiothoracic Surgery | Admitting: Cardiothoracic Surgery

## 2016-10-14 DIAGNOSIS — C799 Secondary malignant neoplasm of unspecified site: Principal | ICD-10-CM

## 2016-10-14 DIAGNOSIS — C3491 Malignant neoplasm of unspecified part of right bronchus or lung: Secondary | ICD-10-CM

## 2016-10-17 ENCOUNTER — Ambulatory Visit (INDEPENDENT_AMBULATORY_CARE_PROVIDER_SITE_OTHER): Payer: Self-pay | Admitting: Physician Assistant

## 2016-10-17 VITALS — BP 117/68 | HR 80 | Resp 20 | Ht 72.0 in | Wt 156.0 lb

## 2016-10-17 DIAGNOSIS — Z902 Acquired absence of lung [part of]: Secondary | ICD-10-CM

## 2016-10-17 DIAGNOSIS — M542 Cervicalgia: Secondary | ICD-10-CM

## 2016-10-17 DIAGNOSIS — J95812 Postprocedural air leak: Secondary | ICD-10-CM

## 2016-10-17 DIAGNOSIS — C3411 Malignant neoplasm of upper lobe, right bronchus or lung: Secondary | ICD-10-CM

## 2016-10-17 NOTE — Progress Notes (Signed)
[address]       [place] [address]             [phone]      [name] [place] Medical Record #[id number] te of Birth: 1950-10-02 Da Referring: [name], MD Primary Care: No PCP Per Patient  Chief Complaint:   POST OP FOLLOW UP  07/19/2016  OPERATIVE REPORT  PREOPERATIVE DIAGNOSIS:  Right upper lobe lung mass. POSTOPERATIVE DIAGNOSIS:  Right upper lobe lung mass. SURGICAL PROCEDURE:  Video bronchoscopy, right video-assisted thoracoscopy, mini thoracotomy, wedge resection of right upper lobe and superior segment of right lower lobe with completion right upper lobectomy, lymph node dissection, and placement of On-Q device. SURGEON:  [name], MD.  08/01/2016 DATE OF DISCHARGE:  OPERATIVE REPORT PREOPERATIVE DIAGNOSIS:  Persistent air leak following right upper lobectomy. POSTOP DIAGNOSIS:  Persistent air leak following right upper lobectomy. SURGICAL PROCEDURE:  Bronchoscopy and placement of Spiration intrabronchial valves x3 in the superior segment of the right lower lobe, in the medial and lateral branches of the right middle lobe. SURGEON:  [name], MD.  08/04/2016 PERATIVE REPORT PREOPERATIVE DIAGNOSIS:  Persistent postoperative air leak. POSTOPERATIVE DIAGNOSIS:  Persistent postoperative air leak. SURGICAL PROCEDURE:  Reoperation with right video-assisted thoracoscopy for persistent air leak with oversewing of the right lower lobe air leak. SURGEON:  [name], MD.  Cancer Staging Adenocarcinoma of right lung, stage 1 [facility]) Staging form: Lung, AJCC 8th Edition - Clinical stage from 09/26/2016: Stage IA2 (cT1b, cN0, cM0) - Signed by Curt Bears, MD on 09/26/2016  Lung cancer [facility]) Staging form: Lung, AJCC 8th Edition - Pathologic stage from 07/22/2016: Stage IA3 (pT1c, pN0, cM0) - Signed by [name], MD on 07/22/2016  History of Present Illness:     Patient returns to the office  today for follow-up after his protracted course with persistent air leak after right upper lobectomy for stage I a carcinoma of the lung. The patient is improving, his appetite is better, he's gaining weight, he remains off cigarettes. He has continued to need some pain medicine but this need is been decreasing, he notes she's taking pain medication just at night currently.  He would like to discuss his oncology situation medical oncology, but I suspect with his Ia disease follow-up chemotherapy would not be indicated. Has appointment early next week at the [facility].    Past Medical History:  Diagnosis Date  . Adenocarcinoma of right lung, stage 1 ([place]) 09/26/2016  . Arthritis   . Cancer [facility])    pt states possible lung cancer to right lung  . COPD (chronic obstructive pulmonary disease) ([place])   . Dyspnea    with exertion  . Enlarged prostate   . Head injury    a. remotely with cranial plate in place.  Marland Kitchen Headache    neck cramps   . Lung mass    a. @ [facility] - 2.5 cm right upper lobe hypermetabolic mass highly suggestive of primary lung cancer.   . Pneumonia   . Tobacco abuse      History  Smoking Status  . Former Smoker  . Packs/day: 1.00  . Years: 50.00  . Types: Cigarettes  . Quit date: 07/19/2016  Smokeless Tobacco  . Never Used    History  Alcohol Use No     No Known Allergies  Current Outpatient Prescriptions  Medication Sig Dispense Refill  . acetaminophen (EQL ARTHRITIS PAIN RELIEF) 650 MG CR tablet  Take 650 mg by mouth every 8 (eight) hours as needed for pain.    Marland Kitchen albuterol (PROVENTIL HFA;VENTOLIN HFA) 108 (90 Base) MCG/ACT inhaler Inhale 2 puffs into the lungs every 6 (six) hours as needed for wheezing or shortness of breath.    . budesonide-formoterol (SYMBICORT) 160-4.5 MCG/ACT inhaler Inhale 2 puffs into the lungs 2 (two) times daily. 1 Inhaler 3  . levalbuterol (XOPENEX HFA) 45 MCG/ACT inhaler Inhale 2 puffs into the lungs every 4 (four) hours as needed  for shortness of breath. 1 Inhaler 1  . tamsulosin (FLOMAX) 0.4 MG CAPS capsule Take 1 capsule (0.4 mg total) by mouth daily after breakfast. 30 capsule 0  . tiotropium (SPIRIVA) 18 MCG inhalation capsule Place 18 mcg into inhaler and inhale daily.    . traMADol (ULTRAM) 50 MG tablet Take 1 tablet (50 mg total) by mouth every 12 (twelve) hours as needed for severe pain. 30 tablet 0   No current facility-administered medications for this visit.        Physical Exam: BP 117/68   Pulse 80   Resp 20   Ht 6' (1.829 m)   Wt 70.8 kg (156 lb)   SpO2 96% Comment: RA  BMI 21.16 kg/m   General appearance: alert and cooperative Neurologic: intact Heart: regular rate and rhythm, S1, S2 normal, no murmur, click, rub or gallop Lungs: clear to auscultation bilaterally Abdomen: soft, non-tender; bowel sounds normal; no masses,  no organomegaly Extremities: extremities normal, atraumatic, no cyanosis or edema and Homans sign is negative, no sign of DVT Wound: His incisions and chest tube sites are healing well without evidence of infection   Diagnostic Studies & Laboratory data:     Recent Radiology Findings:   No results found.    Recent Lab Findings: Lab Results  Component Value Date   WBC 8.0 09/27/2016   HGB 13.2 09/27/2016   HCT 40.9 09/27/2016   PLT 220 09/27/2016   GLUCOSE 120 (H) 09/27/2016   CHOL 243 (H) 07/11/2016   TRIG 102 07/11/2016   HDL 75 07/11/2016   LDLCALC 148 (H) 07/11/2016   ALT 22 09/27/2016   AST 23 09/27/2016   NA 138 09/27/2016   K 3.6 09/27/2016   CL 103 09/27/2016   CREATININE 0.92 09/27/2016   BUN 5 (L) 09/27/2016   CO2 24 09/27/2016   INR 1.04 09/27/2016      Assessment / Plan:      Patient making postoperative progress after complicated right upper lobe resection for stage I non-small cell carcinoma, with his postop course complicated by persistent air leak.   MRI of the neck showed an area that was hyperintense and contrast-enhancing which  could be consistent with metastatic disease. However, the radiologist recommended a CT of the cervical spine without contrast for a more complete assessment of the vertebral body cortex. His CT of the neck showed Well-defined 6 mm hypodense lesion on the right at CT appears communicate with the joint. Given inflammatory changes of the joint on the previous MRI, this likely represents erosion or Schmorl's node. The metastatic disease is not entirely excluded, but considered much slightly in the absence of other lesions. Recent PET scan does not demonstrate other osseous lesions.  He is becoming anxious about returning to work. He works at [employer] which requires heavy lifting (up 150 pounds). I documented in his paperwork today that he is not to return to work until he gets a physician's clearance. This will be up to  Dr. Servando Snare. There is no light duty at his job.   We discussed smoking cessation.  He did confess to using vapor cigarettes that do not have any nicotine. I shared that this is not the best alternative, however is  better than cigarettes. He shares that he has discontinued cigarettes altogether.  He shares that he continues to have neck pain with movement. His job has a lot of repetitive neck movement. Given his CT scan he may need a referral to an orthopedic physician. He does still have tramadol for pain.  He wants to start taking a baby aspirin a day like his wife. I shared that this would probably not hurt. He did not have any evidence of coronary artery disease or cardiac disease upon clearance for lung surgery.  We will see Mr. Steyer next week and Dr. Servando Snare will review his recent CT of the neck. He also has an appointment with his primary care provider, Dr. Dannielle Karvonen on Wednesday.       [address] [place] [address] Office [phone]   Beeper [phone]    Nicholes Rough, PA-C      Subjective Objective: Vital signs (most recent): Blood  pressure 117/68, pulse 80, resp. rate 20, height 6' (1.829 m), weight 70.8 kg (156 lb), SpO2 96 %.  Assessment & Plan

## 2016-10-17 NOTE — Patient Instructions (Addendum)
Our office will call if a follow-up appointment is needed.   Continue to wean pain medication  Follow-up with primary care for urinary retention   [name] is not to return to work until cleared by Dr. Servando Snare.

## 2016-10-26 ENCOUNTER — Other Ambulatory Visit: Payer: Self-pay | Admitting: *Deleted

## 2016-10-26 ENCOUNTER — Ambulatory Visit (INDEPENDENT_AMBULATORY_CARE_PROVIDER_SITE_OTHER): Payer: Self-pay | Admitting: Cardiothoracic Surgery

## 2016-10-26 ENCOUNTER — Ambulatory Visit
Admission: RE | Admit: 2016-10-26 | Discharge: 2016-10-26 | Disposition: A | Discharge status: Home / Self Care | Payer: Managed Care, Other (non HMO) | Source: Ambulatory Visit | Attending: Cardiothoracic Surgery | Admitting: Cardiothoracic Surgery

## 2016-10-26 ENCOUNTER — Encounter: Payer: Self-pay | Admitting: Cardiothoracic Surgery

## 2016-10-26 VITALS — BP 152/87 | HR 71 | Temp 97.0°F | Resp 18 | Ht 72.0 in | Wt 153.0 lb

## 2016-10-26 DIAGNOSIS — R058 Other specified cough: Secondary | ICD-10-CM

## 2016-10-26 DIAGNOSIS — Z902 Acquired absence of lung [part of]: Secondary | ICD-10-CM

## 2016-10-26 DIAGNOSIS — R05 Cough: Secondary | ICD-10-CM

## 2016-10-26 DIAGNOSIS — J95812 Postprocedural air leak: Secondary | ICD-10-CM

## 2016-10-26 DIAGNOSIS — M542 Cervicalgia: Secondary | ICD-10-CM

## 2016-10-26 MED ORDER — BENZONATATE 100 MG PO CAPS
100.0000 mg | ORAL_CAPSULE | Freq: Three times a day (TID) | ORAL | 0 refills | Status: DC | PRN
Start: 2016-10-26 — End: 2016-11-10

## 2016-10-26 MED ORDER — CIPROFLOXACIN HCL 500 MG PO TABS: 500.0000 mg | ORAL_TABLET | Freq: Two times a day (BID) | ORAL | 0 refills | Status: DC

## 2016-10-26 NOTE — Patient Instructions (Signed)

## 2016-10-26 NOTE — Progress Notes (Signed)
[address]       [place] [address]             [phone]      [name] [place] Medical Record #[id number] te of Birth: 04-02-1951 Da Referring: [name], MD Primary Care: No PCP Per Patient  Chief Complaint:   POST OP FOLLOW UP  07/19/2016  OPERATIVE REPORT  PREOPERATIVE DIAGNOSIS:  Right upper lobe lung mass. POSTOPERATIVE DIAGNOSIS:  Right upper lobe lung mass. SURGICAL PROCEDURE:  Video bronchoscopy, right video-assisted thoracoscopy, mini thoracotomy, wedge resection of right upper lobe and superior segment of right lower lobe with completion right upper lobectomy, lymph node dissection, and placement of On-Q device. SURGEON:  [name], MD.  08/01/2016 DATE OF DISCHARGE:  OPERATIVE REPORT PREOPERATIVE DIAGNOSIS:  Persistent air leak following right upper lobectomy. POSTOP DIAGNOSIS:  Persistent air leak following right upper lobectomy. SURGICAL PROCEDURE:  Bronchoscopy and placement of Spiration intrabronchial valves x3 in the superior segment of the right lower lobe, in the medial and lateral branches of the right middle lobe. SURGEON:  [name], MD.  08/04/2016 PERATIVE REPORT PREOPERATIVE DIAGNOSIS:  Persistent postoperative air leak. POSTOPERATIVE DIAGNOSIS:  Persistent postoperative air leak. SURGICAL PROCEDURE:  Reoperation with right video-assisted thoracoscopy for persistent air leak with oversewing of the right lower lobe air leak. SURGEON:  [name], MD.  Cancer Staging Adenocarcinoma of right lung, stage 1 [facility]) Staging form: Lung, AJCC 8th Edition - Clinical stage from 09/26/2016: Stage IA2 (cT1b, cN0, cM0) - Signed by Curt Bears, MD on 09/26/2016  Lung cancer [facility]) Staging form: Lung, AJCC 8th Edition - Pathologic stage from 07/22/2016: Stage IA3 (pT1c, pN0, cM0) - Signed by [name], MD on 07/22/2016  History of Present Illness:     Patient returns to the office  today for follow-up after his protracted course with persistent air leak after right upper lobectomy for stage I a carcinoma of the lung. Since the removal of his bronchial valves seated noted significant improvement and so overall respiratory status. He made a trip to the beach and notes that he was walking several miles without shortness of breath. However over the past week she has had increasing productive cough mostly clear secretions he's been running temperatures intermittently along with the cough and notes that he is more short of breath. He was seen by primary care given MUsinix, with little improvement       Past Medical History:  Diagnosis Date  . Adenocarcinoma of right lung, stage 1 ([place]) 09/26/2016  . Arthritis   . Cancer [facility])    pt states possible lung cancer to right lung  . COPD (chronic obstructive pulmonary disease) ([place])   . Dyspnea    with exertion  . Enlarged prostate   . Head injury    a. remotely with cranial plate in place.  Marland Kitchen Headache    neck cramps   . Lung mass    a. @ [facility] - 2.5 cm right upper lobe hypermetabolic mass highly suggestive of primary lung cancer.   . Pneumonia   . Tobacco abuse      History  Smoking Status  . Former Smoker  . Packs/day: 1.00  . Years: 50.00  . Types: Cigarettes  . Quit date: 07/19/2016  Smokeless Tobacco  . Never Used    History  Alcohol Use No     No Known Allergies  Current Outpatient Prescriptions  Medication Sig Dispense Refill  . acetaminophen (  EQL ARTHRITIS PAIN RELIEF) 650 MG CR tablet Take 650 mg by mouth every 8 (eight) hours as needed for pain.    Marland Kitchen albuterol (PROVENTIL HFA;VENTOLIN HFA) 108 (90 Base) MCG/ACT inhaler Inhale 2 puffs into the lungs every 6 (six) hours as needed for wheezing or shortness of breath.    Marland Kitchen aspirin EC 81 MG tablet Take 81 mg by mouth daily.    . budesonide-formoterol (SYMBICORT) 160-4.5 MCG/ACT inhaler Inhale 2 puffs into the lungs 2 (two) times daily. 1 Inhaler 3  .  levalbuterol (XOPENEX HFA) 45 MCG/ACT inhaler Inhale 2 puffs into the lungs every 4 (four) hours as needed for shortness of breath. 1 Inhaler 1  . tamsulosin (FLOMAX) 0.4 MG CAPS capsule Take 1 capsule (0.4 mg total) by mouth daily after breakfast. 30 capsule 0  . tiotropium (SPIRIVA) 18 MCG inhalation capsule Place 18 mcg into inhaler and inhale daily.    . traMADol (ULTRAM) 50 MG tablet Take 1 tablet (50 mg total) by mouth every 12 (twelve) hours as needed for severe pain. 30 tablet 0   No current facility-administered medications for this visit.        Physical Exam: BP (!) 152/87 (BP Location: Right Arm, Patient Position: Sitting, Cuff Size: Large)   Pulse 71   Temp 97 F (36.1 C) (Oral)   Resp 18   Ht 6' (1.829 m)   Wt 153 lb (69.4 kg)   SpO2 93% Comment: ON RA  BMI 20.75 kg/m   General appearance: alert and cooperative Neurologic: intact Heart: regular rate and rhythm, S1, S2 normal, no murmur, click, rub or gallop Lungs bilateral rhonchi right slightly more pronounced than left no wheezing. Abdomen: soft, non-tender; bowel sounds normal; no masses,  no organomegaly Extremities: extremities normal, atraumatic, no cyanosis or edema and Homans sign is negative, no sign of DVT Wound: His incisions and chest tube sites are healing well without evidence of infection   Diagnostic Studies & Laboratory data:     Recent Radiology Findings:  Dg Chest 2 View  Result Date: 10/26/2016 CLINICAL DATA:  Severe shortness of breath, productive cough and lower RIGHT chest pain for 5 days. History of RIGHT upper lobectomy and vats January 2018. EXAM: CHEST  2 VIEW COMPARISON:  Chest radiograph September 28, 2016 FINDINGS: Cardiac silhouette is normal. Mildly calcified aortic knob. RIGHT lung volume loss compatible with lobectomy with RIGHT lung base pleural thickening. LEFT lung is clear. No pneumothorax. Similar hyperinflation. Soft tissue planes and included osseous structures are nonsuspicious.  IMPRESSION: Stable postoperative changes RIGHT lung and COPD. Electronically Signed   By: Elon Alas M.D.   On: 10/26/2016 15:24   Ct Cervical Spine Wo Contrast  Result Date: 10/14/2016 CLINICAL DATA:  Back pain. Lesion at C2 may represent a bone metastasis. EXAM: CT CERVICAL SPINE WITHOUT CONTRAST TECHNIQUE: Multidetector CT imaging of the cervical spine was performed without intravenous contrast. Multiplanar CT image reconstructions were also generated. COMPARISON:  MRI of the cervical spine 10/10/2016 CT scan 06/22/2016 FINDINGS: Alignment: Slight anterolisthesis at C7-T1 is stable. AP alignment is otherwise anatomic. Skull base and vertebrae: Marked degenerate changes are present at C1-2. The craniocervical junction is otherwise within normal limits. There is a well-defined hypodense lesion on the right at C2 which appears to communicate with the joint space. Inflammatory enhancement was noted in the joint space at C1-2 on the MRI scan. This likely represents a bone erosion or Schmorl's node) a metastatic deposit. No other focal lytic or blastic lesions are  present. Soft tissues and spinal canal: Atherosclerotic calcifications are present at the carotid bifurcations bilaterally. There is some heterogeneity of the thyroid without a dominant lesion. Disc levels: Uncovertebral spurring contributes to left greater than right foraminal narrowing at C4-5. There is asymmetric facet hypertrophy on the left as well. Facet spurring contributes to left foraminal narrowing at C5-6. Upper chest: Centrilobular emphysema is noted. IMPRESSION: 1. Well-defined 6 mm hypodense lesion on the right at CT appears communicate with the joint. Given inflammatory changes of the joint on the previous MRI, this likely represents erosion or Schmorl's node. The metastatic disease is not entirely excluded, but considered much slightly in the absence of other lesions. Recent PET scan does not demonstrate other osseous lesions. 2.  Multilevel spondylosis of the cervical spine as described, left greater than right. 3. Centrilobular emphysema. The patient's known lung nodule was not imaged. Electronically Signed   By: San Morelle M.D.   On: 10/14/2016 17:04   Mr Cervical Spine W Wo Contrast  Result Date: 10/10/2016 CLINICAL DATA:  Neck pain and stiffness.  Lung cancer. EXAM: MRI CERVICAL SPINE WITHOUT AND WITH CONTRAST TECHNIQUE: Multiplanar and multiecho pulse sequences of the cervical spine, to include the craniocervical junction and cervicothoracic junction, were obtained without and with intravenous contrast. CONTRAST:  71m MULTIHANCE GADOBENATE DIMEGLUMINE 529 MG/ML IV SOLN COMPARISON:  PET CT 06/22/2016 FINDINGS: Alignment: Grade 1 C4-C5 anterolisthesis. Vertebrae: There is a low T1/high T2 weighted signal within the superior aspect of the dens, with associated enhancement on postcontrast images, with a linear inferior margin. The aspect to the right of midline within the C2 body is round with more peripheral contrast enhancement. This is visualized on sagittal images only. There is no other focal marrow signal abnormality or contrast enhancement. Cord: Normal signal and caliber. Posterior Fossa, vertebral arteries, paraspinal tissues: Visualized posterior fossa is normal. Vertebral artery flow voids are preserved. Normal visualized paraspinal soft tissues. Disc levels: C1-C2: Normal. C2-C3: Normal disc space and facets. No spinal canal or neuroforaminal stenosis. C3-C4: Normal disc space and facets. No spinal canal or neuroforaminal stenosis. C4-C5: Left-greater-than-right facet hypertrophy and grade 1 anterolisthesis. No spinal canal stenosis. No nerve root impingement. C5-C6: Left-greater-than-right facet and uncovertebral hypertrophy without spinal canal or neural foraminal stenosis. C6-C7: Normal disc space and facets. No spinal canal or neuroforaminal stenosis. C7-T1: Normal disc space and facets. No spinal canal or  neuroforaminal stenosis. IMPRESSION: 1. T2 hyperintense, contrast-enhancing area within the superior aspect of the C2 vertebral body. In the context of a patient with known lung cancer, this is most consistent with a focus of metastatic disease. However, the distribution of the enhancement is somewhat atypical. A superimposed pathologic fracture would be difficult to exclude. CT of the cervical spine without contrast is recommended for more complete assessment of the vertebral body cortex. 2. No spinal canal or neural foraminal stenosis. 3. Grade 1 anterolisthesis at C4-C5 secondary to facet hypertrophy. Electronically Signed   By: KUlyses JarredM.D.   On: 10/10/2016 22:22     Recent Lab Findings: Lab Results  Component Value Date   WBC 8.0 09/27/2016   HGB 13.2 09/27/2016   HCT 40.9 09/27/2016   PLT 220 09/27/2016   GLUCOSE 120 (H) 09/27/2016   CHOL 243 (H) 07/11/2016   TRIG 102 07/11/2016   HDL 75 07/11/2016   LDLCALC 148 (H) 07/11/2016   ALT 22 09/27/2016   AST 23 09/27/2016   NA 138 09/27/2016   K 3.6 09/27/2016   CL 103  09/27/2016   CREATININE 0.92 09/27/2016   BUN 5 (L) 09/27/2016   CO2 24 09/27/2016   INR 1.04 09/27/2016      Assessment / Plan:      Patient returns to office today with signs and symptoms of bronchitis with cough and some fever. Chest x-ray shows no evidence of pneumonia. The patient has been given cough medication Tensilon Perles, and Cipro twice a day for 7 days. With his underlying pulmonary status and COPD we'll have him evaluated by pulmonary service. We'll plan to see him back in 2 weeks with a follow-up chest x-ray. I discussed the findings of his MRI and CT of the neck, likely does not represent metastatic disease. He notes the symptoms have much improved since he noted neck discomfort.   [name] MD      [address] [place] [address] Office [phone]   Beeper [phone]  10/26/2016 3:22 PM

## 2016-11-07 ENCOUNTER — Other Ambulatory Visit: Payer: Self-pay | Admitting: Cardiothoracic Surgery

## 2016-11-07 DIAGNOSIS — C3490 Malignant neoplasm of unspecified part of unspecified bronchus or lung: Secondary | ICD-10-CM

## 2016-11-10 ENCOUNTER — Encounter: Payer: Self-pay | Admitting: Cardiothoracic Surgery

## 2016-11-10 ENCOUNTER — Ambulatory Visit (INDEPENDENT_AMBULATORY_CARE_PROVIDER_SITE_OTHER): Payer: Managed Care, Other (non HMO) | Admitting: Cardiothoracic Surgery

## 2016-11-10 ENCOUNTER — Ambulatory Visit
Admission: RE | Admit: 2016-11-10 | Discharge: 2016-11-10 | Disposition: A | Discharge status: Home / Self Care | Payer: Managed Care, Other (non HMO) | Source: Ambulatory Visit | Attending: Cardiothoracic Surgery | Admitting: Cardiothoracic Surgery

## 2016-11-10 VITALS — BP 108/70 | HR 86 | Resp 16 | Ht 72.0 in | Wt 156.4 lb

## 2016-11-10 DIAGNOSIS — C3411 Malignant neoplasm of upper lobe, right bronchus or lung: Secondary | ICD-10-CM

## 2016-11-10 DIAGNOSIS — J95812 Postprocedural air leak: Secondary | ICD-10-CM

## 2016-11-10 DIAGNOSIS — Z902 Acquired absence of lung [part of]: Secondary | ICD-10-CM

## 2016-11-10 DIAGNOSIS — Z09 Encounter for follow-up examination after completed treatment for conditions other than malignant neoplasm: Secondary | ICD-10-CM | POA: Diagnosis not present

## 2016-11-10 DIAGNOSIS — C3490 Malignant neoplasm of unspecified part of unspecified bronchus or lung: Secondary | ICD-10-CM

## 2016-11-10 NOTE — Progress Notes (Signed)
[address]       [place] [address]             [phone]      [place]  Medical Record #[id number] te of Birth: 02-05-1951 Da Referring: [name], MD Primary Care: Patient, No Pcp Per  Chief Complaint:   POST OP FOLLOW UP  07/19/2016  OPERATIVE REPORT  PREOPERATIVE DIAGNOSIS:  Right upper lobe lung mass. POSTOPERATIVE DIAGNOSIS:  Right upper lobe lung mass. SURGICAL PROCEDURE:  Video bronchoscopy, right video-assisted thoracoscopy, mini thoracotomy, wedge resection of right upper lobe and superior segment of right lower lobe with completion right upper lobectomy, lymph node dissection, and placement of On-Q device. SURGEON:  [name], MD.  08/01/2016 DATE OF DISCHARGE:  OPERATIVE REPORT PREOPERATIVE DIAGNOSIS:  Persistent air leak following right upper lobectomy. POSTOP DIAGNOSIS:  Persistent air leak following right upper lobectomy. SURGICAL PROCEDURE:  Bronchoscopy and placement of Spiration intrabronchial valves x3 in the superior segment of the right lower lobe, in the medial and lateral branches of the right middle lobe. SURGEON:  [name], MD.  08/04/2016 PERATIVE REPORT PREOPERATIVE DIAGNOSIS:  Persistent postoperative air leak. POSTOPERATIVE DIAGNOSIS:  Persistent postoperative air leak. SURGICAL PROCEDURE:  Reoperation with right video-assisted thoracoscopy for persistent air leak with oversewing of the right lower lobe air leak. SURGEON:  [name], MD.  Cancer Staging Adenocarcinoma of right lung, stage 1 [facility]) Staging form: Lung, AJCC 8th Edition - Clinical stage from 09/26/2016: Stage IA2 (cT1b, cN0, cM0) - Signed by Curt Bears, MD on 09/26/2016  Lung cancer [facility]) Staging form: Lung, AJCC 8th Edition - Pathologic stage from 07/22/2016: Stage IA3 (pT1c, pN0, cM0) - Signed by [name], MD on 07/22/2016  History of Present Illness:     Patient returns to the office  today for follow-up after his protracted course with persistent air leak after right upper lobectomy for stage I a carcinoma of the lung. Since the removal of his bronchial valves seated noted significant improvement and so overall respiratory status. He made a trip to the beach and notes that he was walking several miles without shortness of breath. His cough has improved. He remains sob with exertion. He is unable to work due to sob with activity       Past Medical History:  Diagnosis Date  . Adenocarcinoma of right lung, stage 1 ([place]) 09/26/2016  . Arthritis   . Cancer [facility])    pt states possible lung cancer to right lung  . COPD (chronic obstructive pulmonary disease) ([place])   . Dyspnea    with exertion  . Enlarged prostate   . Head injury    a. remotely with cranial plate in place.  Marland Kitchen Headache    neck cramps   . Lung mass    a. @ [facility] - 2.5 cm right upper lobe hypermetabolic mass highly suggestive of primary lung cancer.   . Pneumonia   . Tobacco abuse      History  Smoking Status  . Former Smoker  . Packs/day: 1.00  . Years: 50.00  . Types: Cigarettes  . Quit date: 07/19/2016  Smokeless Tobacco  . Never Used    History  Alcohol Use No     No Known Allergies  Current Outpatient Prescriptions  Medication Sig Dispense Refill  . acetaminophen (EQL ARTHRITIS PAIN RELIEF) 650 MG CR tablet Take 650 mg by mouth every 8 (eight) hours as needed for pain.    Marland Kitchen  albuterol (PROVENTIL HFA;VENTOLIN HFA) 108 (90 Base) MCG/ACT inhaler Inhale 2 puffs into the lungs every 6 (six) hours as needed for wheezing or shortness of breath.    Marland Kitchen aspirin EC 81 MG tablet Take 81 mg by mouth daily.    . budesonide-formoterol (SYMBICORT) 160-4.5 MCG/ACT inhaler Inhale 2 puffs into the lungs 2 (two) times daily. 1 Inhaler 3  . levalbuterol (XOPENEX HFA) 45 MCG/ACT inhaler Inhale 2 puffs into the lungs every 4 (four) hours as needed for shortness of breath. 1 Inhaler 1  . tamsulosin (FLOMAX) 0.4 MG  CAPS capsule Take 1 capsule (0.4 mg total) by mouth daily after breakfast. 30 capsule 0  . tiotropium (SPIRIVA) 18 MCG inhalation capsule Place 18 mcg into inhaler and inhale daily.    . traMADol (ULTRAM) 50 MG tablet Take 1 tablet (50 mg total) by mouth every 12 (twelve) hours as needed for severe pain. 30 tablet 0  . Vitamin D, Cholecalciferol, 1000 units CAPS Take 1 capsule by mouth daily.     No current facility-administered medications for this visit.        Physical Exam: BP 108/70 (BP Location: Right Arm, Patient Position: Sitting, Cuff Size: Large)   Pulse 86   Resp 16   Ht 6' (1.829 m)   Wt 156 lb 6.4 oz (70.9 kg)   SpO2 95% Comment: ION RA  BMI 21.21 kg/m   General appearance: alert and cooperative Neurologic: intact Heart: regular rate and rhythm, S1, S2 normal, no murmur, click, rub or gallop Lungs bilateral rhonchi right slightly more pronounced than left no wheezing. Abdomen: soft, non-tender; bowel sounds normal; no masses,  no organomegaly Extremities: extremities normal, atraumatic, no cyanosis or edema and Homans sign is negative, no sign of DVT Wound: His incisions and chest tube sites are healing well without evidence of infection   Diagnostic Studies & Laboratory data:     Recent Radiology Findings:  Dg Chest 2 View  Result Date: 11/10/2016 CLINICAL DATA:  Recent VATS for malignant neoplasm of the lung. Shortness of breath with productive cough. EXAM: CHEST  2 VIEW COMPARISON:  10/26/2016. FINDINGS: Stable appearance to RIGHT pleural effusion/ pleural thickening. COPD. Pulmonary nodule has been removed with RIGHT upper lobectomy. No pneumothorax. Normal heart size. LEFT lung clear. No osseous findings. IMPRESSION: No active disease, stable appearance compared with priors. Electronically Signed   By: Staci Righter M.D.   On: 11/10/2016 13:53   Ct Cervical Spine Wo Contrast  Result Date: 10/14/2016 CLINICAL DATA:  Back pain. Lesion at C2 may represent a bone  metastasis. EXAM: CT CERVICAL SPINE WITHOUT CONTRAST TECHNIQUE: Multidetector CT imaging of the cervical spine was performed without intravenous contrast. Multiplanar CT image reconstructions were also generated. COMPARISON:  MRI of the cervical spine 10/10/2016 CT scan 06/22/2016 FINDINGS: Alignment: Slight anterolisthesis at C7-T1 is stable. AP alignment is otherwise anatomic. Skull base and vertebrae: Marked degenerate changes are present at C1-2. The craniocervical junction is otherwise within normal limits. There is a well-defined hypodense lesion on the right at C2 which appears to communicate with the joint space. Inflammatory enhancement was noted in the joint space at C1-2 on the MRI scan. This likely represents a bone erosion or Schmorl's node) a metastatic deposit. No other focal lytic or blastic lesions are present. Soft tissues and spinal canal: Atherosclerotic calcifications are present at the carotid bifurcations bilaterally. There is some heterogeneity of the thyroid without a dominant lesion. Disc levels: Uncovertebral spurring contributes to left greater than right foraminal  narrowing at C4-5. There is asymmetric facet hypertrophy on the left as well. Facet spurring contributes to left foraminal narrowing at C5-6. Upper chest: Centrilobular emphysema is noted. IMPRESSION: 1. Well-defined 6 mm hypodense lesion on the right at CT appears communicate with the joint. Given inflammatory changes of the joint on the previous MRI, this likely represents erosion or Schmorl's node. The metastatic disease is not entirely excluded, but considered much slightly in the absence of other lesions. Recent PET scan does not demonstrate other osseous lesions. 2. Multilevel spondylosis of the cervical spine as described, left greater than right. 3. Centrilobular emphysema. The patient's known lung nodule was not imaged. Electronically Signed   By: San Morelle M.D.   On: 10/14/2016 17:04   Mr Cervical Spine W  Wo Contrast  Result Date: 10/10/2016 CLINICAL DATA:  Neck pain and stiffness.  Lung cancer. EXAM: MRI CERVICAL SPINE WITHOUT AND WITH CONTRAST TECHNIQUE: Multiplanar and multiecho pulse sequences of the cervical spine, to include the craniocervical junction and cervicothoracic junction, were obtained without and with intravenous contrast. CONTRAST:  51m MULTIHANCE GADOBENATE DIMEGLUMINE 529 MG/ML IV SOLN COMPARISON:  PET CT 06/22/2016 FINDINGS: Alignment: Grade 1 C4-C5 anterolisthesis. Vertebrae: There is a low T1/high T2 weighted signal within the superior aspect of the dens, with associated enhancement on postcontrast images, with a linear inferior margin. The aspect to the right of midline within the C2 body is round with more peripheral contrast enhancement. This is visualized on sagittal images only. There is no other focal marrow signal abnormality or contrast enhancement. Cord: Normal signal and caliber. Posterior Fossa, vertebral arteries, paraspinal tissues: Visualized posterior fossa is normal. Vertebral artery flow voids are preserved. Normal visualized paraspinal soft tissues. Disc levels: C1-C2: Normal. C2-C3: Normal disc space and facets. No spinal canal or neuroforaminal stenosis. C3-C4: Normal disc space and facets. No spinal canal or neuroforaminal stenosis. C4-C5: Left-greater-than-right facet hypertrophy and grade 1 anterolisthesis. No spinal canal stenosis. No nerve root impingement. C5-C6: Left-greater-than-right facet and uncovertebral hypertrophy without spinal canal or neural foraminal stenosis. C6-C7: Normal disc space and facets. No spinal canal or neuroforaminal stenosis. C7-T1: Normal disc space and facets. No spinal canal or neuroforaminal stenosis. IMPRESSION: 1. T2 hyperintense, contrast-enhancing area within the superior aspect of the C2 vertebral body. In the context of a patient with known lung cancer, this is most consistent with a focus of metastatic disease. However, the  distribution of the enhancement is somewhat atypical. A superimposed pathologic fracture would be difficult to exclude. CT of the cervical spine without contrast is recommended for more complete assessment of the vertebral body cortex. 2. No spinal canal or neural foraminal stenosis. 3. Grade 1 anterolisthesis at C4-C5 secondary to facet hypertrophy. Electronically Signed   By: KUlyses JarredM.D.   On: 10/10/2016 22:22     Recent Lab Findings: Lab Results  Component Value Date   WBC 8.0 09/27/2016   HGB 13.2 09/27/2016   HCT 40.9 09/27/2016   PLT 220 09/27/2016   GLUCOSE 120 (H) 09/27/2016   CHOL 243 (H) 07/11/2016   TRIG 102 07/11/2016   HDL 75 07/11/2016   LDLCALC 148 (H) 07/11/2016   ALT 22 09/27/2016   AST 23 09/27/2016   NA 138 09/27/2016   K 3.6 09/27/2016   CL 103 09/27/2016   CREATININE 0.92 09/27/2016   BUN 5 (L) 09/27/2016   CO2 24 09/27/2016   INR 1.04 09/27/2016      Assessment / Plan:    1/ improved from cough  noted two weeks ago 2/ referred to pulmonary no appointment until June 21  3/ referr to pulmonary rehab 4/ see back in 2 months   [name] MD      [address] [place] [address] Office [phone]   [place]eeper [phone]  11/10/2016 2:17 PM

## 2016-11-22 ENCOUNTER — Other Ambulatory Visit: Payer: Self-pay | Admitting: Physician Assistant

## 2016-12-05 DIAGNOSIS — Z736 Limitation of activities due to disability: Secondary | ICD-10-CM

## 2016-12-22 ENCOUNTER — Other Ambulatory Visit: Payer: Managed Care, Other (non HMO)

## 2016-12-22 ENCOUNTER — Ambulatory Visit (INDEPENDENT_AMBULATORY_CARE_PROVIDER_SITE_OTHER): Payer: Managed Care, Other (non HMO) | Admitting: Pulmonary Disease

## 2016-12-22 ENCOUNTER — Encounter: Payer: Self-pay | Admitting: Pulmonary Disease

## 2016-12-22 VITALS — BP 108/64 | HR 68 | Ht 72.0 in | Wt 156.6 lb

## 2016-12-22 DIAGNOSIS — J449 Chronic obstructive pulmonary disease, unspecified: Secondary | ICD-10-CM

## 2016-12-22 DIAGNOSIS — Z77090 Contact with and (suspected) exposure to asbestos: Secondary | ICD-10-CM | POA: Insufficient documentation

## 2016-12-22 DIAGNOSIS — J929 Pleural plaque without asbestos: Secondary | ICD-10-CM

## 2016-12-22 DIAGNOSIS — C3491 Malignant neoplasm of unspecified part of right bronchus or lung: Secondary | ICD-10-CM | POA: Diagnosis not present

## 2016-12-22 MED ORDER — TIOTROPIUM BROMIDE MONOHYDRATE 1.25 MCG/ACT IN AERS: 2.0000 | INHALATION_SPRAY | Freq: Every day | RESPIRATORY_TRACT | 0 refills | Status: DC

## 2016-12-22 MED ORDER — TIOTROPIUM BROMIDE MONOHYDRATE 1.25 MCG/ACT IN AERS: 2.0000 | INHALATION_SPRAY | Freq: Every day | RESPIRATORY_TRACT | 3 refills | Status: DC

## 2016-12-22 NOTE — Addendum Note (Signed)
Addended by: Tyson Dense on: 12/22/2016 11:03 AM   Modules accepted: Orders

## 2016-12-22 NOTE — Patient Instructions (Addendum)
   Call me if you have any new questions or concerns.  I'm keeping you on Symbicort. Keep using it with a spacer.  I'm stopping your Incruse inhaler and switching you over to Spiriva Respimat.  We will do some breathing & walking tests before I see you back.   TESTS ORDERED: 1. Full PFTs before next appointment 2. 6MWT on room air before next appointment 3. Serum Alpha-1 Antitrypsin Phenotype

## 2016-12-22 NOTE — Progress Notes (Signed)
Subjective:    Patient ID: [name], male    DOB: Jul 02, 1951, 66 y.o.   MRN: [id number]  HPI Patient underwent right upper lobectomy in January for Adenocarcinoma. His post-operative course was complicated by a persistent air leak and he underwent valve placement and subsequent removal. He reports he has significant dyspnea on exertion. He finds that he is having more problems with lifting at his work and has put in for retirement. He reports he is walking 1/4 mile daily trying to increase his endurance. He reports a "random" cough and a burning sensation in his right upper abdomen as well as a "tearing" sensation in his chest at the level of his nipple. He reports a "throbbing" pain at the site of his prior chest tube insertion. He reports his symptoms have been more prevalent and progressive over the last 1.5 months or so. He reports in the last 1-2 weeks he has more problems breathing with bending over. Cough produces a clear mucus. He reports at the beach a couple of months ago he had a fever but no chills or sweats. He was treated with a course of antibiotics for a cough producing a "green" mucus. No nausea or emesis. No diarrhea. No headache. No focal weakness, numbness, tingling, or focal vision loss. He has been on Symbicort & Spiriva since his lobectomy in January. He was on no inhaler preceding January. He reports he uses his rescue inhaler 4-5 times daily. He reports previously was on Spiriva HandiHaler that was then changed to Incruse. He reports he is using his Symbicort with a spacer. He reports previously was getting pneumonia/bronchitis at least twice yearly. He quit smoking in January.   Review of Systems No rashes but does bruise easily. He does have some urinary hesitancy. No hematuria. A pertinent 14 point review of systems is negative except as per the history of presenting illness.  No Known Allergies  Current Outpatient Prescriptions on File Prior to Visit  Medication Sig  Dispense Refill  . acetaminophen (EQL ARTHRITIS PAIN RELIEF) 650 MG CR tablet Take 650 mg by mouth every 8 (eight) hours as needed for pain.    Marland Kitchen albuterol (PROVENTIL HFA;VENTOLIN HFA) 108 (90 Base) MCG/ACT inhaler Inhale 2 puffs into the lungs every 6 (six) hours as needed for wheezing or shortness of breath.    Marland Kitchen aspirin EC 81 MG tablet Take 81 mg by mouth daily.    . budesonide-formoterol (SYMBICORT) 160-4.5 MCG/ACT inhaler Inhale 2 puffs into the lungs 2 (two) times daily. 1 Inhaler 3  . tamsulosin (FLOMAX) 0.4 MG CAPS capsule TAKE ONE CAPSULE BY MOUTH DAILY AFTER BREAKFAST 30 capsule 0  . tiotropium (SPIRIVA) 18 MCG inhalation capsule Place 18 mcg into inhaler and inhale daily.    . traMADol (ULTRAM) 50 MG tablet Take 1 tablet (50 mg total) by mouth every 12 (twelve) hours as needed for severe pain. 30 tablet 0  . Vitamin D, Cholecalciferol, 1000 units CAPS Take 1 capsule by mouth daily.    Marland Kitchen levalbuterol (XOPENEX HFA) 45 MCG/ACT inhaler Inhale 2 puffs into the lungs every 4 (four) hours as needed for shortness of breath. (Patient not taking: Reported on 12/22/2016) 1 Inhaler 1   No current facility-administered medications on file prior to visit.     Past Medical History:  Diagnosis Date  . Adenocarcinoma of right lung, stage 1 ([place]) 09/26/2016  . Arthritis   . Cancer [facility])    pt states possible lung cancer to right lung  .  COPD (chronic obstructive pulmonary disease) ([place])   . Dyspnea    with exertion  . Enlarged prostate   . Head injury    a. remotely with cranial plate in place.  Marland Kitchen Headache    neck cramps   . Lung mass    a. @ [facility] - 2.5 cm right upper lobe hypermetabolic mass highly suggestive of primary lung cancer.   . Pneumonia   . Tobacco abuse     Past Surgical History:  Procedure Laterality Date  . CARPAL TUNNEL RELEASE Right   . COLONOSCOPY    . ELBOW SURGERY     surgery x2 on right elbow  . EYE SURGERY     eye socket fracture, metal plate on left side of face    . HAND SURGERY     multiple hand surgeries on right hand  . LOBECTOMY Right 07/19/2016   Procedure: LOBECTOMY RIGHT UPPER LOBE WITH WEDGE RESECTION SUPERIOR SEGMENT RIGHT LOWER LOBE WITH PLACEMENT OF ON-Q;  Surgeon: [name], MD;  Location: [place] [facility];  Service: Thoracic;  Laterality: Right;  . LYMPH NODE DISSECTION Right 07/19/2016   Procedure: LYMPH NODE DISSECTION;  Surgeon: [name], MD;  Location: [place];  Service: Thoracic;  Laterality: Right;  . SKIN GRAFT     to right hand   . VIDEO ASSISTED THORACOSCOPY Right 08/04/2016   Procedure: RIGHT VIDEO ASSISTED THORACOSCOPY WITH SUTURE CLOSURE OF AIR LEAK;  Surgeon: [name], MD;  Location: [place];  Service: Thoracic;  Laterality: Right;  Marland Kitchen VIDEO ASSISTED THORACOSCOPY (VATS)/WEDGE RESECTION Right 07/19/2016   Procedure: VIDEO ASSISTED THORACOSCOPY (VATS);  Surgeon: [name], MD;  Location: [place];  Service: Thoracic;  Laterality: Right;  Marland Kitchen VIDEO BRONCHOSCOPY N/A 07/19/2016   Procedure: VIDEO BRONCHOSCOPY;  Surgeon: [name], MD;  Location: [place];  Service: Thoracic;  Laterality: N/A;  . VIDEO BRONCHOSCOPY N/A 09/28/2016   Procedure: VIDEO BRONCHOSCOPY, with general anesthesia for removal of interbronchial valves x 3;  Surgeon: [name], MD;  Location: [place];  Service: Thoracic;  Laterality: N/A;  . VIDEO BRONCHOSCOPY WITH INSERTION OF INTERBRONCHIAL VALVE (IBV) Right 08/01/2016   Procedure: VIDEO BRONCHOSCOPY WITH INSERTION OF INTERBRONCHIAL VALVE (IBV);  Surgeon: [name], MD;  Location: [place];  Service: Thoracic;  Laterality: Right;  . WEDGE RESECTION Right 07/19/2016   Procedure: WEDGE  RESECTION RIGHT UPPER LOBE;  Surgeon: [name], MD;  Location[facility];  Service: Thoracic;  Laterality: Right;    Family History  Problem Relation Age of Onset  . Aneurysm Mother        Brain  . Other Mother        Pacemaker  . Colon cancer Father   . Heart disease Father        CABG in his 80s,  passed at 66  . Hypercholesterolemia Brother   . Lung cancer Paternal Uncle     Social History   Social History  . Marital status: Married    Spouse name: N/A  . Number of children: N/A  . Years of education: N/A   Social History Main Topics  . Smoking status: Former Smoker    Packs/day: 1.00    Years: 50.00    Types: Cigarettes    Quit date: 07/19/2016  . Smokeless tobacco: Never Used  . Alcohol use No  . Drug use: No  . Sexual activity: Not Asked   Other Topics Concern  . None   Social  History Narrative   [facility] (12/22/16):   Originally from [place]. Works in Set designer. He is married. No pets currently. No bird, mold, or hot tub exposure. 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       Objective:   Physical Exam BP 108/64 (BP Location: Left Arm, Patient Position: Sitting, Cuff Size: Normal)   Pulse 68   Ht 6' (1.829 m)   Wt 156 lb 9.6 oz (71 kg)   SpO2 96%   BMI 21.24 kg/m  General:  Awake. Alert. No acute distress. Thin, Caucasian male. Integument:  Warm & dry. No rash on exposed skin.  Extremities:  No cyanosis or clubbing.  Lymphatics:  No appreciated cervical or supraclavicular lymphadenoapthy. HEENT:  Moist mucus membranes. No oral ulcers. No scleral injection or icterus. No nasal turbinate swelling. Cardiovascular:  Regular rate and rhythm. No edema. No appreciable JVD.  Pulmonary:  Good aeration & clear to auscultation bilaterally. Symmetric chest wall expansion. No accessory muscle use on room air. Abdomen: Soft. Normal bowel sounds. Nondistended.  Musculoskeletal:  Normal bulk and tone. Hand grip strength 5/5 bilaterally. No joint deformity or effusion appreciated. Neurological:  CN 2-12 grossly in tact. No meningismus. Moving all 4 extremities equally. Symmetric brachioradialis deep tendon reflexes. Psychiatric:  Mood and affect congruent. Speech normal rhythm, rate & tone.   PFT 06/15/16:  FVC 4.37 L (98%) FEV1 1.71 L (49%) FEV1/FVC 0.40 FEF 25-75 0.39 L (11%) negative bronchodilator response TLC 8.26 L (121%) RV 133% DLCO uncorrected 55%  IMAGING CXR PA/LAT 11/10/16 (personally reviewed by me):  Small right pleural effusion with tenting of the diaphragm and volume loss on the right. No parenchymal mass or opacity appreciated. Heart normal in size & mediastinum normal in contour.  PATHOLOGY RUL WEDGE/LOBECTOMY BIOPSY (07/19/16):  Invasive Adenocarcinoma measuring 2.5cm w/ Calcified Pleural Plaque  Lymph Nodes 4R/10R/11R/12R (07/19/16):  Negative for malignancy    Assessment & Plan:  67 y.o. male with Severe COPD based on his pre-lobectomy PFTs reviewed by me. I believe he would benefit from non-powder inhaled medications. He may have some hypoxia on exertion that needs to be assessed. We discussed his asbestos exposure as the likely cause for his pleural plaques. I did advise the patient to notify me if he had any new breathing problems or questions before his next appointment.  1. Severe COPD:  Continuing Symbicort with Spacer. Switching to Spiriva Respimat. Checking Full PFTs and 6MWT on room air before next appointment. Screening for alpha-1 antitrypsin deficiency.  2. 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 Monitoring with PFTs and imaging. 3. Right upper lobe adenocarcinoma: Status post lobectomy. Following with medical oncology. 4. Health maintenance: Plan to address at next appointment. Patient reports it has been over 10 years since his pneumonia vaccine & had previous adverse reactions to immunizations: Illness. 5. Follow-up: Return to clinic in 6 weeks or sooner if needed.   Sonia Baller Ashok Cordia, M.D. [facility] Pulmonary [facility] Critical Care Pager:  [phone] After 3pm or if no response, call [phone] 10:34 AM 12/22/16

## 2016-12-23 ENCOUNTER — Other Ambulatory Visit: Payer: Self-pay | Admitting: Cardiothoracic Surgery

## 2016-12-28 LAB — ALPHA-1 ANTITRYPSIN PHENOTYPE: A1 ANTITRYPSIN: 197 mg/dL (ref 83–199)

## 2017-01-02 ENCOUNTER — Telehealth: Payer: Self-pay | Admitting: Pulmonary Disease

## 2017-01-02 NOTE — Telephone Encounter (Signed)
A prior authorization was inititated via Covermymeds.com using key [id number] for patient's Spiriva respimat. Per covermymeds, the decision can take 72-120. Will await decision.

## 2017-01-05 NOTE — Telephone Encounter (Signed)
Checked on determination status; decision has not been made. Will check again at later time.

## 2017-01-10 NOTE — Telephone Encounter (Signed)
Qvar is not comparable to Anoro or Incruse. He was previously on Incruse and Spiriva Handi-Haler as well. Yes please appeal.

## 2017-01-10 NOTE — Telephone Encounter (Signed)
PA for Spiriva was denied citing the patient has not had a documented intolerance or failure to Qvar. Pt alternatives are Qvar, Incruse, or Anoro. JN, please advise if you want to file an appeal or change medication. Thank you!

## 2017-01-12 ENCOUNTER — Other Ambulatory Visit: Payer: Self-pay | Admitting: *Deleted

## 2017-01-12 ENCOUNTER — Encounter: Payer: Self-pay | Admitting: Cardiothoracic Surgery

## 2017-01-12 ENCOUNTER — Telehealth: Payer: Self-pay

## 2017-01-12 ENCOUNTER — Telehealth: Payer: Self-pay | Admitting: Pulmonary Disease

## 2017-01-12 ENCOUNTER — Ambulatory Visit
Admission: RE | Admit: 2017-01-12 | Discharge: 2017-01-12 | Disposition: A | Discharge status: Home / Self Care | Payer: Managed Care, Other (non HMO) | Source: Ambulatory Visit | Attending: Cardiothoracic Surgery | Admitting: Cardiothoracic Surgery

## 2017-01-12 ENCOUNTER — Ambulatory Visit (INDEPENDENT_AMBULATORY_CARE_PROVIDER_SITE_OTHER): Payer: Managed Care, Other (non HMO) | Admitting: Cardiothoracic Surgery

## 2017-01-12 VITALS — BP 116/73 | HR 95 | Resp 20 | Ht 72.0 in | Wt 159.0 lb

## 2017-01-12 DIAGNOSIS — Z902 Acquired absence of lung [part of]: Secondary | ICD-10-CM

## 2017-01-12 DIAGNOSIS — J95812 Postprocedural air leak: Secondary | ICD-10-CM

## 2017-01-12 DIAGNOSIS — C3411 Malignant neoplasm of upper lobe, right bronchus or lung: Secondary | ICD-10-CM

## 2017-01-12 MED ORDER — TIOTROPIUM BROMIDE MONOHYDRATE 1.25 MCG/ACT IN AERS: 2.0000 | INHALATION_SPRAY | Freq: Every day | RESPIRATORY_TRACT | 0 refills | Status: DC

## 2017-01-12 NOTE — Progress Notes (Signed)
[address]       [place] [address]             [phone]      Syler T Starkel [place] Medical Record #[id number] te of Birth: July 10, 1950 Da Referring: [name], MD Primary Care: Patient, No Pcp Per  Chief Complaint:   POST OP FOLLOW UP  07/19/2016  OPERATIVE REPORT  PREOPERATIVE DIAGNOSIS:  Right upper lobe lung mass. POSTOPERATIVE DIAGNOSIS:  Right upper lobe lung mass. SURGICAL PROCEDURE:  Video bronchoscopy, right video-assisted thoracoscopy, mini thoracotomy, wedge resection of right upper lobe and superior segment of right lower lobe with completion right upper lobectomy, lymph node dissection, and placement of On-Q device. SURGEON:  [name], MD.  08/01/2016 DATE OF DISCHARGE:  OPERATIVE REPORT PREOPERATIVE DIAGNOSIS:  Persistent air leak following right upper lobectomy. POSTOP DIAGNOSIS:  Persistent air leak following right upper lobectomy. SURGICAL PROCEDURE:  Bronchoscopy and placement of Spiration intrabronchial valves x3 in the superior segment of the right lower lobe, in the medial and lateral branches of the right middle lobe. SURGEON:  [name], MD.  08/04/2016 PERATIVE REPORT PREOPERATIVE DIAGNOSIS:  Persistent postoperative air leak. POSTOPERATIVE DIAGNOSIS:  Persistent postoperative air leak. SURGICAL PROCEDURE:  Reoperation with right video-assisted thoracoscopy for persistent air leak with oversewing of the right lower lobe air leak. SURGEON:  [name], MD.  Cancer Staging Adenocarcinoma of right lung, stage 1 [facility]) Staging form: Lung, AJCC 8th Edition - Clinical stage from 09/26/2016: Stage IA2 (cT1b, cN0, cM0) - Signed by Curt Bears, MD on 09/26/2016  Lung cancer [facility]) Staging form: Lung, AJCC 8th Edition - Pathologic stage from 07/22/2016: Stage IA3 (pT1c, pN0, cM0) - Signed by [name], MD on 07/22/2016  History of Present Illness:      Patient returns to the office  today for follow-up after his lung resection January 2018. The patient notes that he is now decided to retire. He has been increasing his activity but still notes dyspnea on exertion, and some cutting off his breath when he bends over to tie his shoes. He's been seen in the pulmonary office, who recommended to stay on Spiriva    Past Medical History:  Diagnosis Date  . Adenocarcinoma of right lung, stage 1 ([place]) 09/26/2016  . Arthritis   . Cancer [facility])    pt states possible lung cancer to right lung  . COPD (chronic obstructive pulmonary disease) ([place])   . Dyspnea    with exertion  . Enlarged prostate   . Head injury    a. remotely with cranial plate in place.  Marland Kitchen Headache    neck cramps   . Lung mass    a. @ [facility] - 2.5 cm right upper lobe hypermetabolic mass highly suggestive of primary lung cancer.   . Pneumonia   . Tobacco abuse      History  Smoking Status  . Former Smoker  . Packs/day: 1.00  . Years: 50.00  . Types: Cigarettes  . Quit date: 07/19/2016  Smokeless Tobacco  . Never Used    History  Alcohol Use No     No Known Allergies  Current Outpatient Prescriptions  Medication Sig Dispense Refill  . acetaminophen (EQL ARTHRITIS PAIN RELIEF) 650 MG CR tablet Take 650 mg by mouth every 8 (eight) hours as needed for pain.    Marland Kitchen albuterol (PROVENTIL HFA;VENTOLIN HFA) 108 (90 Base) MCG/ACT inhaler Inhale 2 puffs into the lungs every 6 (six) hours as  needed for wheezing or shortness of breath.    Marland Kitchen aspirin EC 81 MG tablet Take 81 mg by mouth daily.    . budesonide-formoterol (SYMBICORT) 160-4.5 MCG/ACT inhaler Inhale 2 puffs into the lungs 2 (two) times daily. 1 Inhaler 3  . levalbuterol (XOPENEX HFA) 45 MCG/ACT inhaler Inhale 2 puffs into the lungs every 4 (four) hours as needed for shortness of breath. 1 Inhaler 1  . tamsulosin (FLOMAX) 0.4 MG CAPS capsule TAKE ONE CAPSULE BY MOUTH DAILY AFTER BREAKFAST 30 capsule 0  . Tiotropium Bromide Monohydrate (SPIRIVA RESPIMAT)  1.25 MCG/ACT AERS Inhale 2 puffs into the lungs daily. 1 Inhaler 0  . Tiotropium Bromide Monohydrate (SPIRIVA RESPIMAT) 1.25 MCG/ACT AERS Inhale 2 puffs into the lungs daily. 1 Inhaler 3  . traMADol (ULTRAM) 50 MG tablet Take 1 tablet (50 mg total) by mouth every 12 (twelve) hours as needed for severe pain. 30 tablet 0  . Vitamin D, Cholecalciferol, 1000 units CAPS Take 1 capsule by mouth daily.    Marland Kitchen tiotropium (SPIRIVA) 18 MCG inhalation capsule Place 18 mcg into inhaler and inhale daily.     No current facility-administered medications for this visit.        Physical Exam: BP 116/73   Pulse 95   Resp 20   Ht 6' (1.829 m)   Wt 159 lb (72.1 kg)   SpO2 96% Comment: RA  BMI 21.56 kg/m   Physical Exam  Constitutional: No distress.  HENT:  Mouth/Throat: No oropharyngeal exudate.  Eyes: Left eye exhibits no discharge.  Neck: No JVD present. No tracheal deviation present. No thyromegaly present.  Cardiovascular: Normal rate and regular rhythm.  Exam reveals no gallop and no friction rub.   No murmur heard. Respiratory: Effort normal and breath sounds normal. No stridor. No respiratory distress. He has no wheezes. He has no rales. He exhibits no tenderness.  GI: He exhibits no distension and no mass. There is no tenderness. There is no rebound and no guarding.  Musculoskeletal: He exhibits no edema or deformity.  Lymphadenopathy:    He has no cervical adenopathy.  Skin: He is not diaphoretic.  Psychiatric: He has a normal mood and affect. His behavior is normal. Judgment and thought content normal.    Diagnostic Studies & Laboratory data:     Recent Radiology Findings:  Dg Chest 2 View  Result Date: 01/12/2017 CLINICAL DATA:  Shortness of breath for 2-3 weeks. EXAM: CHEST  2 VIEW COMPARISON:  11/10/2016 FINDINGS: The cardiac silhouette, mediastinal and hilar contours are within normal limits and stable. Stable significant chronic lung changes with emphysema and pulmonary  scarring. No acute overlying pulmonary process. Surgical changes from a prior right lung lesion resection. Chronic right basilar pleural thickening. The bony thorax is intact. IMPRESSION: Stable chronic lung changes/emphysema. No acute overlying pulmonary process. Electronically Signed   By: Marijo Sanes M.D.   On: 01/12/2017 13:36   Ct Cervical Spine Wo Contrast  Result Date: 10/14/2016 CLINICAL DATA:  Back pain. Lesion at C2 may represent a bone metastasis. EXAM: CT CERVICAL SPINE WITHOUT CONTRAST TECHNIQUE: Multidetector CT imaging of the cervical spine was performed without intravenous contrast. Multiplanar CT image reconstructions were also generated. COMPARISON:  MRI of the cervical spine 10/10/2016 CT scan 06/22/2016 FINDINGS: Alignment: Slight anterolisthesis at C7-T1 is stable. AP alignment is otherwise anatomic. Skull base and vertebrae: Marked degenerate changes are present at C1-2. The craniocervical junction is otherwise within normal limits. There is a well-defined hypodense lesion on the right  at C2 which appears to communicate with the joint space. Inflammatory enhancement was noted in the joint space at C1-2 on the MRI scan. This likely represents a bone erosion or Schmorl's node) a metastatic deposit. No other focal lytic or blastic lesions are present. Soft tissues and spinal canal: Atherosclerotic calcifications are present at the carotid bifurcations bilaterally. There is some heterogeneity of the thyroid without a dominant lesion. Disc levels: Uncovertebral spurring contributes to left greater than right foraminal narrowing at C4-5. There is asymmetric facet hypertrophy on the left as well. Facet spurring contributes to left foraminal narrowing at C5-6. Upper chest: Centrilobular emphysema is noted. IMPRESSION: 1. Well-defined 6 mm hypodense lesion on the right at CT appears communicate with the joint. Given inflammatory changes of the joint on the previous MRI, this likely represents  erosion or Schmorl's node. The metastatic disease is not entirely excluded, but considered much slightly in the absence of other lesions. Recent PET scan does not demonstrate other osseous lesions. 2. Multilevel spondylosis of the cervical spine as described, left greater than right. 3. Centrilobular emphysema. The patient's known lung nodule was not imaged. Electronically Signed   By: San Morelle M.D.   On: 10/14/2016 17:04   Mr Cervical Spine W Wo Contrast  Result Date: 10/10/2016 CLINICAL DATA:  Neck pain and stiffness.  Lung cancer. EXAM: MRI CERVICAL SPINE WITHOUT AND WITH CONTRAST TECHNIQUE: Multiplanar and multiecho pulse sequences of the cervical spine, to include the craniocervical junction and cervicothoracic junction, were obtained without and with intravenous contrast. CONTRAST:  64mL MULTIHANCE GADOBENATE DIMEGLUMINE 529 MG/ML IV SOLN COMPARISON:  PET CT 06/22/2016 FINDINGS: Alignment: Grade 1 C4-C5 anterolisthesis. Vertebrae: There is a low T1/high T2 weighted signal within the superior aspect of the dens, with associated enhancement on postcontrast images, with a linear inferior margin. The aspect to the right of midline within the C2 body is round with more peripheral contrast enhancement. This is visualized on sagittal images only. There is no other focal marrow signal abnormality or contrast enhancement. Cord: Normal signal and caliber. Posterior Fossa, vertebral arteries, paraspinal tissues: Visualized posterior fossa is normal. Vertebral artery flow voids are preserved. Normal visualized paraspinal soft tissues. Disc levels: C1-C2: Normal. C2-C3: Normal disc space and facets. No spinal canal or neuroforaminal stenosis. C3-C4: Normal disc space and facets. No spinal canal or neuroforaminal stenosis. C4-C5: Left-greater-than-right facet hypertrophy and grade 1 anterolisthesis. No spinal canal stenosis. No nerve root impingement. C5-C6: Left-greater-than-right facet and uncovertebral  hypertrophy without spinal canal or neural foraminal stenosis. C6-C7: Normal disc space and facets. No spinal canal or neuroforaminal stenosis. C7-T1: Normal disc space and facets. No spinal canal or neuroforaminal stenosis. IMPRESSION: 1. T2 hyperintense, contrast-enhancing area within the superior aspect of the C2 vertebral body. In the context of a patient with known lung cancer, this is most consistent with a focus of metastatic disease. However, the distribution of the enhancement is somewhat atypical. A superimposed pathologic fracture would be difficult to exclude. CT of the cervical spine without contrast is recommended for more complete assessment of the vertebral body cortex. 2. No spinal canal or neural foraminal stenosis. 3. Grade 1 anterolisthesis at C4-C5 secondary to facet hypertrophy. Electronically Signed   By: Ulyses Jarred M.D.   On: 10/10/2016 22:22     Recent Lab Findings: Lab Results  Component Value Date   WBC 8.0 09/27/2016   HGB 13.2 09/27/2016   HCT 40.9 09/27/2016   PLT 220 09/27/2016   GLUCOSE 120 (H) 09/27/2016  CHOL 243 (H) 07/11/2016   TRIG 102 07/11/2016   HDL 75 07/11/2016   LDLCALC 148 (H) 07/11/2016   ALT 22 09/27/2016   AST 23 09/27/2016   NA 138 09/27/2016   K 3.6 09/27/2016   CL 103 09/27/2016   CREATININE 0.92 09/27/2016   BUN 5 (L) 09/27/2016   CO2 24 09/27/2016   INR 1.04 09/27/2016      Assessment / Plan:    Patient's stable 6 months after his right upper lobectomy, comp carotid by long postoperative course with persistent air leak that ultimately resolved. Patient has a follow-up CT scan of the chest for stage I carcinoma the lung in August ordered by oncology. I'll plan to see him back in 3 months  [name] MD      [address] [place] [address] Office [phone]   Beeper [phone]  01/12/2017 1:51 PM

## 2017-01-12 NOTE — Telephone Encounter (Signed)
Patient is aware samples are up front. Patient stated he will pick them up on 01/13/2017.-AJ

## 2017-01-12 NOTE — Telephone Encounter (Signed)
Appeal started on CMM. Insurance should have a decision within 5 business days. Will route to [facility] for follow up.

## 2017-01-12 NOTE — Telephone Encounter (Signed)
I have called and lmomtcb x 1 to make the pt aware of the samples that have been left up front.

## 2017-01-12 NOTE — Telephone Encounter (Signed)
Noted.  Will close encounter.  

## 2017-01-12 NOTE — Telephone Encounter (Signed)
Dr Servando Snare review CXR from today and the results were normal. Patient notified.

## 2017-01-13 ENCOUNTER — Telehealth: Payer: Self-pay | Admitting: *Deleted

## 2017-01-13 NOTE — Telephone Encounter (Signed)
Received Appeal reconsideration form and completed. Faxed back along with last office visit note which was due by 01/16/17. [phone] fax for [employer] Awaiting determination.

## 2017-01-26 ENCOUNTER — Ambulatory Visit (INDEPENDENT_AMBULATORY_CARE_PROVIDER_SITE_OTHER): Payer: Managed Care, Other (non HMO) | Admitting: Pulmonary Disease

## 2017-01-26 DIAGNOSIS — J449 Chronic obstructive pulmonary disease, unspecified: Secondary | ICD-10-CM

## 2017-01-26 LAB — PULMONARY FUNCTION TEST
DL/VA % PRED: 35 %
DL/VA: 1.68 ml/min/mmHg/L
DLCO COR: 10.1 ml/min/mmHg
DLCO UNC % PRED: 29 %
DLCO UNC: 10.47 ml/min/mmHg
DLCO cor % pred: 28 %
FEF 25-75 PRE: 0.67 L/s
FEF 25-75 Post: 0.97 L/sec
FEF2575-%CHANGE-POST: 44 %
FEF2575-%PRED-PRE: 23 %
FEF2575-%Pred-Post: 34 %
FEV1-%Change-Post: 15 %
FEV1-%PRED-POST: 53 %
FEV1-%Pred-Pre: 46 %
FEV1-POST: 1.96 L
FEV1-PRE: 1.7 L
FEV1FVC-%Change-Post: 6 %
FEV1FVC-%PRED-PRE: 65 %
FEV6-%CHANGE-POST: 10 %
FEV6-%PRED-POST: 81 %
FEV6-%PRED-PRE: 74 %
FEV6-POST: 3.8 L
FEV6-Pre: 3.44 L
FEV6FVC-%CHANGE-POST: 1 %
FEV6FVC-%Pred-Post: 104 %
FEV6FVC-%Pred-Pre: 103 %
FVC-%Change-Post: 8 %
FVC-%PRED-PRE: 71 %
FVC-%Pred-Post: 78 %
FVC-Post: 3.83 L
FVC-Pre: 3.52 L
POST FEV6/FVC RATIO: 99 %
PRE FEV1/FVC RATIO: 48 %
Post FEV1/FVC ratio: 51 %
Pre FEV6/FVC Ratio: 98 %
RV % PRED: 144 %
RV: 3.6 L
TLC % pred: 96 %
TLC: 7.15 L

## 2017-01-26 NOTE — Progress Notes (Signed)
PFT done today. 

## 2017-01-27 NOTE — Telephone Encounter (Signed)
Spoke with DeeDee from [place] who stated the appeal is still in process and could take 30 days for answer; appeal was received on 01/13/17. Will await decision.

## 2017-01-31 NOTE — Telephone Encounter (Signed)
Spoke with Erling Conte from [place] who stated the appeal is still in process and could take 30 days. Will await decision.

## 2017-02-02 ENCOUNTER — Encounter: Payer: Self-pay | Admitting: Pulmonary Disease

## 2017-02-02 ENCOUNTER — Ambulatory Visit (INDEPENDENT_AMBULATORY_CARE_PROVIDER_SITE_OTHER): Payer: Self-pay | Admitting: *Deleted

## 2017-02-02 ENCOUNTER — Ambulatory Visit (INDEPENDENT_AMBULATORY_CARE_PROVIDER_SITE_OTHER): Payer: Self-pay | Admitting: Pulmonary Disease

## 2017-02-02 VITALS — BP 106/62 | HR 57 | Ht 72.0 in | Wt 160.4 lb

## 2017-02-02 DIAGNOSIS — R0609 Other forms of dyspnea: Secondary | ICD-10-CM

## 2017-02-02 DIAGNOSIS — J449 Chronic obstructive pulmonary disease, unspecified: Secondary | ICD-10-CM

## 2017-02-02 DIAGNOSIS — Z77090 Contact with and (suspected) exposure to asbestos: Secondary | ICD-10-CM

## 2017-02-02 MED ORDER — SPACER/AERO CHAMBER MOUTHPIECE MISC: 1.0000 | Freq: Two times a day (BID) | 0 refills | Status: AC

## 2017-02-02 MED ORDER — SPACER/AERO CHAMBER MOUTHPIECE MISC: 1.0000 | Freq: Every morning | 0 refills | Status: DC

## 2017-02-02 NOTE — Progress Notes (Signed)
Subjective:    Patient ID: [name], male    DOB: 15-Nov-1950, 66 y.o.   MRN: [id number]  C.C.:  Follow-up for Severe COPD, H/O Asbestos Exposure, & Right Upper Lobe Adenocarcinoma.  HPI Severe COPD: Switched to Spiriva Respimat at last appointment & continued on Symbicort. He reports he previously was using the Resipmat device. He is still having dyspnea on exertion He still feels short of breath with bending over. He reports occasional coughing. He does have some rare wheezing as well. No exacerbations since last appointment.   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  Right upper lobe adenocarcinoma: Status post right upper lobectomy by Dr. Madolyn Frieze. Follows with medical oncology.  Review of Systems He denies any new chest pain. Does have a stabbing and piercing pain in his right chest from his prior chest tubes. No fever, chills, or sweats. No nausea or emesis. Does have RUQ abdominal pain with his coughing.   No Known Allergies  Current Outpatient Prescriptions on File Prior to Visit  Medication Sig Dispense Refill  . acetaminophen (EQL ARTHRITIS PAIN RELIEF) 650 MG CR tablet Take 650 mg by mouth every 8 (eight) hours as needed for pain.    Marland Kitchen albuterol (PROVENTIL HFA;VENTOLIN HFA) 108 (90 Base) MCG/ACT inhaler Inhale 2 puffs into the lungs every 6 (six) hours as needed for wheezing or shortness of breath.    Marland Kitchen aspirin EC 81 MG tablet Take 81 mg by mouth daily.    . budesonide-formoterol (SYMBICORT) 160-4.5 MCG/ACT inhaler Inhale 2 puffs into the lungs 2 (two) times daily. 1 Inhaler 3  . levalbuterol (XOPENEX HFA) 45 MCG/ACT inhaler Inhale 2 puffs into the lungs every 4 (four) hours as needed for shortness of breath. 1 Inhaler 1  . tamsulosin (FLOMAX) 0.4 MG CAPS capsule TAKE ONE CAPSULE BY MOUTH DAILY AFTER BREAKFAST 30 capsule 0  . Tiotropium Bromide Monohydrate (SPIRIVA RESPIMAT) 1.25 MCG/ACT AERS Inhale 2 puffs into the lungs daily. 2 Inhaler 0  .  traMADol (ULTRAM) 50 MG tablet Take 1 tablet (50 mg total) by mouth every 12 (twelve) hours as needed for severe pain. 30 tablet 0  . Vitamin D, Cholecalciferol, 1000 units CAPS Take 1 capsule by mouth daily.     No current facility-administered medications on file prior to visit.     Past Medical History:  Diagnosis Date  . Adenocarcinoma of right lung, stage 1 ([place]) 09/26/2016  . Arthritis   . Cancer [facility])    pt states possible lung cancer to right lung  . COPD (chronic obstructive pulmonary disease) ([place])   . Dyspnea    with exertion  . Enlarged prostate   . Head injury    a. remotely with cranial plate in place.  Marland Kitchen Headache    neck cramps   . Lung mass    a. @ [facility] - 2.5 cm right upper lobe hypermetabolic mass highly suggestive of primary lung cancer.   . Pneumonia   . Tobacco abuse     Past Surgical History:  Procedure Laterality Date  . CARPAL TUNNEL RELEASE Right   . COLONOSCOPY    . ELBOW SURGERY     surgery x2 on right elbow  . EYE SURGERY     eye socket fracture, metal plate on left side of face  . HAND SURGERY     multiple hand surgeries on right hand  . LOBECTOMY Right 07/19/2016   Procedure: LOBECTOMY RIGHT UPPER LOBE WITH WEDGE RESECTION SUPERIOR SEGMENT  RIGHT LOWER LOBE WITH PLACEMENT OF ON-Q;  Surgeon: [name], MD;  Location: [place];  Service: Thoracic;  Laterality: Right;  . LYMPH NODE DISSECTION Right 07/19/2016   Procedure: LYMPH NODE DISSECTION;  Surgeon: [name], MD;  Location: [place];  Service: Thoracic;  Laterality: Right;  . SKIN GRAFT     to right hand   . VIDEO ASSISTED THORACOSCOPY Right 08/04/2016   Procedure: RIGHT VIDEO ASSISTED THORACOSCOPY WITH SUTURE CLOSURE OF AIR LEAK;  Surgeon: [name], MD;  Location: [place];  Service: Thoracic;  Laterality: Right;  Marland Kitchen VIDEO ASSISTED THORACOSCOPY (VATS)/WEDGE RESECTION Right 07/19/2016   Procedure: VIDEO ASSISTED THORACOSCOPY (VATS);  Surgeon: [name], MD;  Location: [place];   Service: Thoracic;  Laterality: Right;  Marland Kitchen VIDEO BRONCHOSCOPY N/A 07/19/2016   Procedure: VIDEO BRONCHOSCOPY;  Surgeon: [name], MD;  Location: [place];  Service: Thoracic;  Laterality: N/A;  . VIDEO BRONCHOSCOPY N/A 09/28/2016   Procedure: VIDEO BRONCHOSCOPY, with general anesthesia for removal of interbronchial valves x 3;  Surgeon: [name], MD;  Location: [place];  Service: Thoracic;  Laterality: N/A;  . VIDEO BRONCHOSCOPY WITH INSERTION OF INTERBRONCHIAL VALVE (IBV) Right 08/01/2016   Procedure: VIDEO BRONCHOSCOPY WITH INSERTION OF INTERBRONCHIAL VALVE (IBV);  Surgeon: [name], MD;  Location: [place];  Service: Thoracic;  Laterality: Right;  . WEDGE RESECTION Right 07/19/2016   Procedure: WEDGE  RESECTION RIGHT UPPER LOBE;  Surgeon: [name], MD;  [facility];  Service: Thoracic;  Laterality: Right;    Family History  Problem Relation Age of Onset  . Aneurysm Mother        Brain  . Other Mother        Pacemaker  . Colon cancer Father   . Heart disease Father        CABG in his 48s, passed at 75  . Hypercholesterolemia Brother   . Lung cancer Paternal Uncle     Social History   Social History  . Marital status: Married    Spouse name: N/A  . Number of children: N/A  . Years of education: N/A   Social History Main Topics  . Smoking status: Former Smoker    Packs/day: 1.00    Years: 50.00    Types: Cigarettes    Quit date: 07/19/2016  . Smokeless tobacco: Never Used  . Alcohol use No  . Drug use: No  . Sexual activity: Not Asked   Other Topics Concern  . None   Social History Narrative   [facility] (12/22/16):   Originally from [place]. Works in Set designer. He is married. No pets currently. No bird, mold, or hot tub exposure. 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       Objective:   Physical Exam BP 106/62 (BP Location: Left Arm, Patient Position: Sitting, Cuff Size:  Normal)   Pulse (!) 57   Ht 6' (1.829 m)   Wt 160 lb 6.4 oz (72.8 kg)   SpO2 98%   BMI 21.75 kg/m   General:  Awake. Alert. Caucasian male. No distress. Integument:  Warm & dry. No rash on exposed skin. Healed surgical and chest tube incisions on right thorax. Extremities:  No cyanosis or clubbing.  HEENT:  Moist mucus membranes. No scleral icterus. No oral ulcers. Cardiovascular:  Regular rate. No edema. Regular rhythm.  Pulmonary:  Clear bilaterally to auscultation. Normal work of breathing on  room air. Abdomen: Soft. Normal bowel sounds. Nondistended.  Musculoskeletal:  Normal bulk and tone. Hand grip, biceps, and triceps strength 5/5 bilaterally. No joint deformity or effusion appreciated.  PFT 01/26/17: FVC 3.52 L (71%) FEV1 1.70 L (46%) FEV1/FVC 0.48 FEF 25-75 0.67 L (23%) positive bronchodilator response TLC 7.15 L (96%) RV 144% ERV 89% DLC corrected 28% 06/15/16: FVC 4.37 L (98%) FEV1 1.71 L (49%) FEV1/FVC 0.40 FEF 25-75 0.39 L (11%) negative bronchodilator response TLC 8.26 L (121%) RV 133% DLCO uncorrected 55%  6MWT 02/02/17:  Walked 435 meters / Baseline Sat 100% on RA / Nadir Sat 96% on RA  IMAGING CXR PA/LAT 11/10/16 (previously reviewed by me):  Small right pleural effusion with tenting of the diaphragm and volume loss on the right. No parenchymal mass or opacity appreciated. Heart normal in size & mediastinum normal in contour.  PATHOLOGY RUL WEDGE/LOBECTOMY BIOPSY (07/19/16):  Invasive Adenocarcinoma measuring 2.5cm w/ Calcified Pleural Plaque  Lymph Nodes 4R/10R/11R/12R (07/19/16):  Negative for malignancy  LABS 12/22/16 Alpha-1 antitrypsin: MM (197)    Assessment & Plan:  66 y.o. male with severe COPD and history of asbestos exposure. Patient's spirometry again shows severe airway obstruction but also shows a significant bronchodilator response. Despite the severe reduction in his carbon monoxide diffusion capacity he has no significant desaturation or evidence of an  oxygen requirement during his 6 minute walk test today. Given the patient's continued symptoms he may benefit from transition to a nebulizer regimen, but for now I will attempt to use a spacer to achieve better drug delivery. I instructed the patient to notify me if his breathing did not significantly improve.  1. Severe COPD:  Trying patient on Symbicort with spacer. Continuing Spiriva Respimat. If no symptomatic improvement plan to transition from Symbicort to Pulmicort and Perforomist nebulized twice daily. Repeat spirometry with bronchodilator challenge at next appointment to ensure maximal rehabilitation. 2. Asbestos exposure: Calcified pleural plaques. Continue to monitor with yearly pulmonary function testing. 3. Right upper lobe adenocarcinoma: Continuing to follow with medical oncology. Status post lobectomy. 4. Health maintenance: Patient previously declined immunizations. 5. Follow-up: Return to clinic in 3 months or sooner if needed.  Sonia Baller Ashok Cordia, M.D. [facility] Pager:  [phone] After 3pm or if no response, call [phone] 10:38 AM 02/02/17

## 2017-02-02 NOTE — Progress Notes (Signed)
SIX MIN WALK 02/02/2017  Medications ASA 81mg , Spiriva, Vitamin D 1090mcg, Flomax 0.4mg  @ 0630  Supplimental Oxygen during Test? (L/min) No  Laps 9  Partial Lap (in Meters) 3  Baseline BP (sitting) 104/66  Baseline Heartrate 66  Baseline Dyspnea (Borg Scale) 1  Baseline Fatigue (Borg Scale) 3  Baseline SPO2 100  BP (sitting) 144/64  Heartrate 81  Dyspnea (Borg Scale) 7  Fatigue ([place]org Scale) 7  SPO2 96  BP (sitting) 108/64  Heartrate 74  SPO2 100  Stopped or Paused before Six Minutes No  Interpretation Dizziness  Distance Completed 435  Tech Comments: moderate-fast pace, pt did not stop or pause during the test.  c/o dizziness x3weeks and burning from the knee down in both legs that he attributes to the level of exertion.

## 2017-02-02 NOTE — Patient Instructions (Signed)
   Try using the Spacer with your Symbicort inhaler.  If your breathing, etc doesn't improve after a week or so of using Symbicort with the spacer let me know and we will try you on a nebulizer regimen.  Call me if you have any questions or concerns before your next appointment.  TESTS ORDERED: 1. Spirometry with bronchodilator challenge at next appointment

## 2017-02-13 DIAGNOSIS — R0789 Other chest pain: Secondary | ICD-10-CM | POA: Diagnosis not present

## 2017-02-13 DIAGNOSIS — E7800 Pure hypercholesterolemia, unspecified: Secondary | ICD-10-CM | POA: Diagnosis not present

## 2017-02-13 DIAGNOSIS — J449 Chronic obstructive pulmonary disease, unspecified: Secondary | ICD-10-CM | POA: Diagnosis not present

## 2017-02-13 DIAGNOSIS — E559 Vitamin D deficiency, unspecified: Secondary | ICD-10-CM | POA: Diagnosis not present

## 2017-02-21 ENCOUNTER — Telehealth: Payer: Self-pay | Admitting: Internal Medicine

## 2017-02-21 NOTE — Telephone Encounter (Signed)
Left message for patient regarding appointment 10/2.

## 2017-02-23 NOTE — Addendum Note (Signed)
Addendum  created 02/23/17 0945 by Roberts Gaudy, MD   Sign clinical note

## 2017-03-27 ENCOUNTER — Ambulatory Visit (HOSPITAL_COMMUNITY)
Admission: RE | Admit: 2017-03-27 | Discharge: 2017-03-27 | Disposition: A | Discharge status: Home / Self Care | Payer: PPO | Source: Ambulatory Visit | Attending: Internal Medicine | Admitting: Internal Medicine

## 2017-03-27 ENCOUNTER — Other Ambulatory Visit (HOSPITAL_BASED_OUTPATIENT_CLINIC_OR_DEPARTMENT_OTHER): Payer: PPO

## 2017-03-27 DIAGNOSIS — J439 Emphysema, unspecified: Secondary | ICD-10-CM | POA: Diagnosis not present

## 2017-03-27 DIAGNOSIS — I700 Atherosclerosis of aorta: Secondary | ICD-10-CM | POA: Diagnosis not present

## 2017-03-27 DIAGNOSIS — Z902 Acquired absence of lung [part of]: Secondary | ICD-10-CM | POA: Insufficient documentation

## 2017-03-27 DIAGNOSIS — C3491 Malignant neoplasm of unspecified part of right bronchus or lung: Secondary | ICD-10-CM

## 2017-03-27 DIAGNOSIS — C3411 Malignant neoplasm of upper lobe, right bronchus or lung: Secondary | ICD-10-CM | POA: Diagnosis not present

## 2017-03-27 DIAGNOSIS — C3490 Malignant neoplasm of unspecified part of unspecified bronchus or lung: Secondary | ICD-10-CM | POA: Diagnosis not present

## 2017-03-27 LAB — COMPREHENSIVE METABOLIC PANEL
ALT: <6 U/L (ref 0–55)
ANION GAP: 9 meq/L (ref 3–11)
AST: 13 U/L (ref 5–34)
Albumin: 3.7 g/dL (ref 3.5–5.0)
Alkaline Phosphatase: 103 U/L (ref 40–150)
BILIRUBIN TOTAL: 0.53 mg/dL (ref 0.20–1.20)
BUN: 8.3 mg/dL (ref 7.0–26.0)
CHLORIDE: 104 meq/L (ref 98–109)
CO2: 25 meq/L (ref 22–29)
Calcium: 9.7 mg/dL (ref 8.4–10.4)
Creatinine: 0.9 mg/dL (ref 0.7–1.3)
EGFR: 89 mL/min/{1.73_m2} — AB (ref >90)
Glucose: 95 mg/dl (ref 70–140)
Potassium: 4.3 mEq/L (ref 3.5–5.1)
SODIUM: 137 meq/L (ref 136–145)
TOTAL PROTEIN: 7.2 g/dL (ref 6.4–8.3)

## 2017-03-27 LAB — CBC WITH DIFFERENTIAL/PLATELET
BASO%: 0.6 % (ref 0.0–2.0)
Basophils Absolute: 0 10*3/uL (ref 0.0–0.1)
EOS%: 1.3 % (ref 0.0–7.0)
Eosinophils Absolute: 0.1 10*3/uL (ref 0.0–0.5)
HCT: 47.3 % (ref 38.4–49.9)
HGB: 15.9 g/dL (ref 13.0–17.1)
LYMPH%: 21.6 % (ref 14.0–49.0)
MCH: 31.3 pg (ref 27.2–33.4)
MCHC: 33.6 g/dL (ref 32.0–36.0)
MCV: 93.1 fL (ref 79.3–98.0)
MONO#: 0.7 10*3/uL (ref 0.1–0.9)
MONO%: 9.4 % (ref 0.0–14.0)
NEUT%: 67.1 % (ref 39.0–75.0)
NEUTROS ABS: 4.7 10*3/uL (ref 1.5–6.5)
Platelets: 187 10*3/uL (ref 140–400)
RBC: 5.08 10*6/uL (ref 4.20–5.82)
RDW: 14.6 % (ref 11.0–14.6)
WBC: 7.1 10*3/uL (ref 4.0–10.3)
lymph#: 1.5 10*3/uL (ref 0.9–3.3)

## 2017-03-27 MED ORDER — IOPAMIDOL (ISOVUE-300) INJECTION 61%
75.0000 mL | Freq: Once | INTRAVENOUS | Status: AC | PRN
  Administered 2017-03-27: 75 mL via INTRAVENOUS

## 2017-03-27 MED ORDER — IOPAMIDOL (ISOVUE-300) INJECTION 61%
INTRAVENOUS | Status: AC
Start: 2017-03-27 — End: 2017-03-27
  Filled 2017-03-27: qty 75

## 2017-03-29 ENCOUNTER — Ambulatory Visit: Payer: Managed Care, Other (non HMO) | Admitting: Internal Medicine

## 2017-04-04 ENCOUNTER — Encounter: Payer: Self-pay | Admitting: Internal Medicine

## 2017-04-04 ENCOUNTER — Ambulatory Visit (HOSPITAL_BASED_OUTPATIENT_CLINIC_OR_DEPARTMENT_OTHER): Payer: PPO | Admitting: Internal Medicine

## 2017-04-04 ENCOUNTER — Telehealth: Payer: Self-pay | Admitting: Internal Medicine

## 2017-04-04 VITALS — BP 119/56 | HR 62 | Temp 98.0°F | Resp 18 | Ht 72.0 in | Wt 163.7 lb

## 2017-04-04 DIAGNOSIS — R42 Dizziness and giddiness: Secondary | ICD-10-CM | POA: Diagnosis not present

## 2017-04-04 DIAGNOSIS — R0609 Other forms of dyspnea: Secondary | ICD-10-CM

## 2017-04-04 DIAGNOSIS — C3491 Malignant neoplasm of unspecified part of right bronchus or lung: Secondary | ICD-10-CM

## 2017-04-04 DIAGNOSIS — C3411 Malignant neoplasm of upper lobe, right bronchus or lung: Secondary | ICD-10-CM

## 2017-04-04 DIAGNOSIS — C3490 Malignant neoplasm of unspecified part of unspecified bronchus or lung: Secondary | ICD-10-CM

## 2017-04-04 NOTE — Progress Notes (Signed)
[place] [phone]   [phone]  OFFICE PROGRESS NOTE  Gaye Alken, PA-C [place] [address]  DIAGNOSIS: Stage IA (T1b, N0, M0) non-small cell lung cancer, well-differentiated adenocarcinoma presented with right lower lobe lung nodule diagnosed in January 2018.  PRIOR THERAPY: Video bronchoscopy in addition to right upper lobectomy and wedge resection of the superior segment of the right lower lobe with lymph node dissection under the care of Dr. Servando Snare on 07/19/2016.  CURRENT THERAPY: Observation.  INTERVAL HISTORY: [name] 66 y.o. male returns to the clinic today for six-month follow-up visit. The patient is feeling fine today with no specific complaints except for occasional dizzy spells. He is currently on Flomax for benign prostatic hypertrophy. He denied having any chest pain but has shortness of breath with exertion was no cough or hemoptysis. He denied having any fever or chills. He has no nausea, vomiting, diarrhea or constipation. The patient had repeat CT scan of the chest performed recently and he is here for evaluation and discussion of his scan results.  MEDICAL HISTORY: Past Medical History:  Diagnosis Date  . Adenocarcinoma of right lung, stage 1 ([place]) 09/26/2016  . Arthritis   . Cancer [facility])    pt states possible lung cancer to right lung  . COPD (chronic obstructive pulmonary disease) ([place])   . Dyspnea    with exertion  . Enlarged prostate   . Head injury    a. remotely with cranial plate in place.  Marland Kitchen Headache    neck cramps   . Lung mass    a. @ [facility] - 2.5 cm right upper lobe hypermetabolic mass highly suggestive of primary lung cancer.   . Pneumonia   . Tobacco abuse     ALLERGIES:  has No Known Allergies.  MEDICATIONS:  Current Outpatient Prescriptions  Medication Sig Dispense Refill  . acetaminophen (EQL ARTHRITIS PAIN RELIEF) 650 MG CR tablet Take 650 mg by mouth every 8  (eight) hours as needed for pain.    Marland Kitchen albuterol (PROVENTIL HFA;VENTOLIN HFA) 108 (90 Base) MCG/ACT inhaler Inhale 2 puffs into the lungs every 6 (six) hours as needed for wheezing or shortness of breath.    Marland Kitchen aspirin EC 81 MG tablet Take 81 mg by mouth daily.    . budesonide-formoterol (SYMBICORT) 160-4.5 MCG/ACT inhaler Inhale 2 puffs into the lungs 2 (two) times daily. 1 Inhaler 3  . levalbuterol (XOPENEX HFA) 45 MCG/ACT inhaler Inhale 2 puffs into the lungs every 4 (four) hours as needed for shortness of breath. 1 Inhaler 1  . Spacer/Aero Chamber Mouthpiece MISC 1 Device by Does not apply route 2 (two) times daily. 1 each 0  . tamsulosin (FLOMAX) 0.4 MG CAPS capsule TAKE ONE CAPSULE BY MOUTH DAILY AFTER BREAKFAST 30 capsule 0  . Tiotropium Bromide Monohydrate (SPIRIVA RESPIMAT) 1.25 MCG/ACT AERS Inhale 2 puffs into the lungs daily. 2 Inhaler 0  . traMADol (ULTRAM) 50 MG tablet Take 1 tablet (50 mg total) by mouth every 12 (twelve) hours as needed for severe pain. 30 tablet 0  . Vitamin D, Cholecalciferol, 1000 units CAPS Take 1 capsule by mouth daily.     No current facility-administered medications for this visit.     SURGICAL HISTORY:  Past Surgical History:  Procedure Laterality Date  . CARPAL TUNNEL RELEASE Right   . COLONOSCOPY    . ELBOW SURGERY     surgery x2 on right elbow  . EYE SURGERY  eye socket fracture, metal plate on left side of face  . HAND SURGERY     multiple hand surgeries on right hand  . LOBECTOMY Right 07/19/2016   Procedure: LOBECTOMY RIGHT UPPER LOBE WITH WEDGE RESECTION SUPERIOR SEGMENT RIGHT LOWER LOBE WITH PLACEMENT OF ON-Q;  Surgeon: [name], MD;  Location: [place];  Service: Thoracic;  Laterality: Right;  . LYMPH NODE DISSECTION Right 07/19/2016   Procedure: LYMPH NODE DISSECTION;  Surgeon: [name], MD;  Location: [place];  Service: Thoracic;  Laterality: Right;  . SKIN GRAFT     to right hand   . VIDEO ASSISTED THORACOSCOPY Right  08/04/2016   Procedure: RIGHT VIDEO ASSISTED THORACOSCOPY WITH SUTURE CLOSURE OF AIR LEAK;  Surgeon: [name], MD;  Location: [place];  Service: Thoracic;  Laterality: Right;  Marland Kitchen VIDEO ASSISTED THORACOSCOPY (VATS)/WEDGE RESECTION Right 07/19/2016   Procedure: VIDEO ASSISTED THORACOSCOPY (VATS);  Surgeon: [name], MD;  Location: [place];  Service: Thoracic;  Laterality: Right;  Marland Kitchen VIDEO BRONCHOSCOPY N/A 07/19/2016   Procedure: VIDEO BRONCHOSCOPY;  Surgeon: [name], MD;  Location: [place];  Service: Thoracic;  Laterality: N/A;  . VIDEO BRONCHOSCOPY N/A 09/28/2016   Procedure: VIDEO BRONCHOSCOPY, with general anesthesia for removal of interbronchial valves x 3;  Surgeon: [name], MD;  Location: [place];  Service: Thoracic;  Laterality: N/A;  . VIDEO BRONCHOSCOPY WITH INSERTION OF INTERBRONCHIAL VALVE (IBV) Right 08/01/2016   Procedure: VIDEO BRONCHOSCOPY WITH INSERTION OF INTERBRONCHIAL VALVE (IBV);  Surgeon: [name], MD;  Location: [place];  Service: Thoracic;  Laterality: Right;  . WEDGE RESECTION Right 07/19/2016   Procedure: WEDGE  RESECTION RIGHT UPPER LOBE;  Surgeon: [name], MD;  [facility];  Service: Thoracic;  Laterality: Right;    REVIEW OF SYSTEMS:  A comprehensive review of systems was negative except for: Respiratory: positive for dyspnea on exertion Neurological: positive for dizziness   PHYSICAL EXAMINATION: General appearance: alert, cooperative and no distress Head: Normocephalic, without obvious abnormality, atraumatic Neck: no adenopathy, no JVD, supple, symmetrical, trachea midline and thyroid not enlarged, symmetric, no tenderness/mass/nodules Lymph nodes: Cervical, supraclavicular, and axillary nodes normal. Resp: clear to auscultation bilaterally Back: symmetric, no curvature. ROM normal. No CVA tenderness. Cardio: regular rate and rhythm, S1, S2 normal, no murmur, click, rub or gallop GI: soft, non-tender; bowel sounds normal;  no masses,  no organomegaly Extremities: extremities normal, atraumatic, no cyanosis or edema  ECOG PERFORMANCE STATUS: 1 - Symptomatic but completely ambulatory  Blood pressure (!) 119/56, pulse 62, temperature 98 F (36.7 C), temperature source Oral, resp. rate 18, height 6' (1.829 m), weight 163 lb 11.2 oz (74.3 kg), SpO2 98 %.  LABORATORY DATA: Lab Results  Component Value Date   WBC 7.1 03/27/2017   HGB 15.9 03/27/2017   HCT 47.3 03/27/2017   MCV 93.1 03/27/2017   PLT 187 03/27/2017      Chemistry      Component Value Date/Time   NA 137 03/27/2017 0944   K 4.3 03/27/2017 0944   CL 103 09/27/2016 1411   CO2 25 03/27/2017 0944   BUN 8.3 03/27/2017 0944   CREATININE 0.9 03/27/2017 0944      Component Value Date/Time   CALCIUM 9.7 03/27/2017 0944   ALKPHOS 103 03/27/2017 0944   AST 13 03/27/2017 0944   ALT <6 03/27/2017 0944   BILITOT 0.53 03/27/2017 0944       RADIOGRAPHIC STUDIES: Ct Chest W Contrast  Result Date: 03/27/2017 CLINICAL DATA:  Right lung cancer diagnosed in January 2018 status post partial right lung resection. EXAM: CT CHEST WITH CONTRAST TECHNIQUE: Multidetector CT imaging of the chest was performed during intravenous contrast administration. CONTRAST:  58mL ISOVUE-300 IOPAMIDOL (ISOVUE-300) INJECTION 61% COMPARISON:  Chest radiographs 01/12/2017. PET-CT 06/22/2016. Outside chest CT 06/08/2016. FINDINGS: Cardiovascular: There is atherosclerosis of the aorta, great vessels and coronary arteries. No acute vascular findings are demonstrated. The heart size is normal. There is a small amount of fluid in the superior pericardial recess. Mediastinum/Nodes: There are no enlarged mediastinal, hilar or axillary lymph nodes. The thyroid gland, trachea and esophagus demonstrate no significant findings. Lungs/Pleura: Status post right upper lobectomy. There is a small amount of loculated fluid at the right costophrenic angle. There is no pneumothorax. Moderate  centrilobular emphysema is noted. There is mild scarring at the right lung base. No confluent airspace opacity or suspicious pulmonary nodule. Upper abdomen: No acute or suspicious findings are seen within the visualized upper abdomen. Oval low-density anterior to the pancreatic body appears to reflect opacified small bowel as correlated with previous CT. There is no evidence of adrenal mass. Musculoskeletal/Chest wall: There is no chest wall mass or suspicious osseous finding. Right-sided thoracotomy defect noted. IMPRESSION: 1. Interval right upper lobe resection without demonstrated complication. There is a small amount of loculated pleural fluid on the right. 2. No evidence of local recurrence or metastatic disease. No suspicious pulmonary nodules. 3. Aortic Atherosclerosis (ICD10-I70.0) and Emphysema (ICD10-J43.9). Electronically Signed   By: Richardean Sale M.D.   On: 03/27/2017 13:22    ASSESSMENT AND PLAN: this is a very pleasant 66 years old white male with history of stage IA non-small cell lung cancer status post right upper lobectomy with lymph node dissection in January 2018. The patient is currently on observation and he is feeling fine with no specific complaints. His recent CT scan of the chest showed no evidence for disease recurrence. I discussed the scan results with the patient and his wife and recommended for him to continue on observation with repeat CT scan of the chest in 6 months. He was advised to call immediately if he has any concerning symptoms in the interval. For the dizzy spells, I advised the patient to check with his primary care physician for adjustment of his medication especially Flomax. The patient voices understanding of current disease status and treatment options and is in agreement with the current care plan. All questions were answered. The patient knows to call the clinic with any problems, questions or concerns. We can certainly see the patient much sooner if  necessary.  I spent 10 minutes counseling the patient face to face. The total time spent in the appointment was 15 minutes.  Disclaimer: This note was dictated with voice recognition software. Similar sounding words can inadvertently be transcribed and may not be corrected upon review.

## 2017-04-04 NOTE — Telephone Encounter (Signed)
Gave patient avs report and appointments for March and April 2019. Central radiology will call re scan.

## 2017-04-06 ENCOUNTER — Encounter: Payer: Self-pay | Admitting: Cardiothoracic Surgery

## 2017-04-06 ENCOUNTER — Ambulatory Visit (INDEPENDENT_AMBULATORY_CARE_PROVIDER_SITE_OTHER): Payer: PPO | Admitting: Cardiothoracic Surgery

## 2017-04-06 VITALS — BP 122/73 | HR 56 | Resp 96 | Ht 72.0 in | Wt 163.0 lb

## 2017-04-06 DIAGNOSIS — C3411 Malignant neoplasm of upper lobe, right bronchus or lung: Secondary | ICD-10-CM | POA: Diagnosis not present

## 2017-04-06 DIAGNOSIS — Z902 Acquired absence of lung [part of]: Secondary | ICD-10-CM | POA: Diagnosis not present

## 2017-04-06 NOTE — Progress Notes (Signed)
[address]       [place] [address]             [phone]      [name] [place] Medical Record #[id number] te of Birth: January 15, 1951 Da Referring: No ref. provider found Primary Care: Gaye Alken, PA-C  Chief Complaint:   POST OP FOLLOW UP  07/19/2016  OPERATIVE REPORT  PREOPERATIVE DIAGNOSIS:  Right upper lobe lung mass. POSTOPERATIVE DIAGNOSIS:  Right upper lobe lung mass. SURGICAL PROCEDURE:  Video bronchoscopy, right video-assisted thoracoscopy, mini thoracotomy, wedge resection of right upper lobe and superior segment of right lower lobe with completion right upper lobectomy, lymph node dissection, and placement of On-Q device. SURGEON:  [name], MD.  08/01/2016 DATE OF DISCHARGE:  OPERATIVE REPORT PREOPERATIVE DIAGNOSIS:  Persistent air leak following right upper lobectomy. POSTOP DIAGNOSIS:  Persistent air leak following right upper lobectomy. SURGICAL PROCEDURE:  Bronchoscopy and placement of Spiration intrabronchial valves x3 in the superior segment of the right lower lobe, in the medial and lateral branches of the right middle lobe. SURGEON:  [name], MD.  08/04/2016 PERATIVE REPORT PREOPERATIVE DIAGNOSIS:  Persistent postoperative air leak. POSTOPERATIVE DIAGNOSIS:  Persistent postoperative air leak. SURGICAL PROCEDURE:  Reoperation with right video-assisted thoracoscopy for persistent air leak with oversewing of the right lower lobe air leak. SURGEON:  [name], MD.  Cancer Staging Adenocarcinoma of right lung, stage 1 [facility]) Staging form: Lung, AJCC 8th Edition - Clinical stage from 09/26/2016: Stage IA2 (cT1b, cN0, cM0) - Signed by Curt Bears, MD on 09/26/2016  Lung cancer [facility]) Staging form: Lung, AJCC 8th Edition - Pathologic stage from 07/22/2016: Stage IA3 (pT1c, pN0, cM0) - Signed by [name], MD on 07/22/2016  History of Present Illness:      Patient returns to the  office today for follow-up after his lung resection January 2018. Patient has retired from work. He remains off cigarettes. He continues to increase his activity appropriately. He notes that he has had some mild "dizziness", and feeling outside of his body. He will see his primary care doctor for further evaluation of this. Should be noted he did have a negative CT scan done at [facility] in January 2018.     Past Medical History:  Diagnosis Date  . Adenocarcinoma of right lung, stage 1 ([place]) 09/26/2016  . Arthritis   . Cancer [facility])    pt states possible lung cancer to right lung  . COPD (chronic obstructive pulmonary disease) ([place])   . Dyspnea    with exertion  . Enlarged prostate   . Head injury    a. remotely with cranial plate in place.  Marland Kitchen Headache    neck cramps   . Lung mass    a. @ [facility] - 2.5 cm right upper lobe hypermetabolic mass highly suggestive of primary lung cancer.   . Pneumonia   . Tobacco abuse      History  Smoking Status  . Former Smoker  . Packs/day: 1.00  . Years: 50.00  . Types: Cigarettes  . Quit date: 07/19/2016  Smokeless Tobacco  . Never Used    History  Alcohol Use No     No Known Allergies  Current Outpatient Prescriptions  Medication Sig Dispense Refill  . acetaminophen (EQL ARTHRITIS PAIN RELIEF) 650 MG CR tablet Take 650 mg by mouth every 8 (eight) hours as needed for pain.    Marland Kitchen albuterol (PROVENTIL HFA;VENTOLIN HFA) 108 (90 Base) MCG/ACT  inhaler Inhale 2 puffs into the lungs every 6 (six) hours as needed for wheezing or shortness of breath.    Marland Kitchen aspirin EC 81 MG tablet Take 81 mg by mouth daily.    . budesonide-formoterol (SYMBICORT) 160-4.5 MCG/ACT inhaler Inhale 2 puffs into the lungs 2 (two) times daily. 1 Inhaler 3  . levalbuterol (XOPENEX HFA) 45 MCG/ACT inhaler Inhale 2 puffs into the lungs every 4 (four) hours as needed for shortness of breath. 1 Inhaler 1  . Spacer/Aero Chamber Mouthpiece MISC 1 Device by Does not apply route  2 (two) times daily. 1 each 0  . tamsulosin (FLOMAX) 0.4 MG CAPS capsule TAKE ONE CAPSULE BY MOUTH DAILY AFTER BREAKFAST 30 capsule 0  . Tiotropium Bromide Monohydrate (SPIRIVA RESPIMAT) 1.25 MCG/ACT AERS Inhale 2 puffs into the lungs daily. 2 Inhaler 0  . traMADol (ULTRAM) 50 MG tablet Take 1 tablet (50 mg total) by mouth every 12 (twelve) hours as needed for severe pain. 30 tablet 0   No current facility-administered medications for this visit.        Physical Exam: BP 122/73 (BP Location: Right Arm, Patient Position: Sitting, Cuff Size: Large)   Pulse (!) 56   Resp (!) 96 Comment: ON RA  Ht 6' (1.829 m)   Wt 163 lb (73.9 kg)   BMI 22.11 kg/m   Physical Exam  Constitutional: He is oriented to person, place, and time.  HENT:  Mouth/Throat: No oropharyngeal exudate.  Neck: No JVD present. No tracheal deviation present. No thyromegaly present.  Cardiovascular: Normal rate and regular rhythm.  Exam reveals no gallop and no friction rub.   No murmur heard. Respiratory: Effort normal. No stridor. No respiratory distress. He has no wheezes. He has no rales. He exhibits no tenderness.  GI: He exhibits no distension and no mass. There is no tenderness. There is no rebound and no guarding.  Musculoskeletal: He exhibits no edema or deformity.  Lymphadenopathy:    He has no cervical adenopathy.  Neurological: He is alert and oriented to person, place, and time.  Skin: He is not diaphoretic.  Psychiatric: He has a normal mood and affect. His behavior is normal. Judgment and thought content normal.    Diagnostic Studies & Laboratory data:     Recent Radiology Findings:   Ct Chest W Contrast  Result Date: 03/27/2017 CLINICAL DATA:  Right lung cancer diagnosed in January 2018 status post partial right lung resection. EXAM: CT CHEST WITH CONTRAST TECHNIQUE: Multidetector CT imaging of the chest was performed during intravenous contrast administration. CONTRAST:  80mL ISOVUE-300 IOPAMIDOL  (ISOVUE-300) INJECTION 61% COMPARISON:  Chest radiographs 01/12/2017. PET-CT 06/22/2016. Outside chest CT 06/08/2016. FINDINGS: Cardiovascular: There is atherosclerosis of the aorta, great vessels and coronary arteries. No acute vascular findings are demonstrated. The heart size is normal. There is a small amount of fluid in the superior pericardial recess. Mediastinum/Nodes: There are no enlarged mediastinal, hilar or axillary lymph nodes. The thyroid gland, trachea and esophagus demonstrate no significant findings. Lungs/Pleura: Status post right upper lobectomy. There is a small amount of loculated fluid at the right costophrenic angle. There is no pneumothorax. Moderate centrilobular emphysema is noted. There is mild scarring at the right lung base. No confluent airspace opacity or suspicious pulmonary nodule. Upper abdomen: No acute or suspicious findings are seen within the visualized upper abdomen. Oval low-density anterior to the pancreatic body appears to reflect opacified small bowel as correlated with previous CT. There is no evidence of adrenal mass. Musculoskeletal/Chest wall:  There is no chest wall mass or suspicious osseous finding. Right-sided thoracotomy defect noted. IMPRESSION: 1. Interval right upper lobe resection without demonstrated complication. There is a small amount of loculated pleural fluid on the right. 2. No evidence of local recurrence or metastatic disease. No suspicious pulmonary nodules. 3. Aortic Atherosclerosis (ICD10-I70.0) and Emphysema (ICD10-J43.9). Electronically Signed   By: Richardean Sale M.D.   On: 03/27/2017 13:22  Ct Cervical Spine Wo Contrast  Result Date: 10/14/2016 CLINICAL DATA:  Back pain. Lesion at C2 may represent a bone metastasis. EXAM: CT CERVICAL SPINE WITHOUT CONTRAST TECHNIQUE: Multidetector CT imaging of the cervical spine was performed without intravenous contrast. Multiplanar CT image reconstructions were also generated. COMPARISON:  MRI of the  cervical spine 10/10/2016 CT scan 06/22/2016 FINDINGS: Alignment: Slight anterolisthesis at C7-T1 is stable. AP alignment is otherwise anatomic. Skull base and vertebrae: Marked degenerate changes are present at C1-2. The craniocervical junction is otherwise within normal limits. There is a well-defined hypodense lesion on the right at C2 which appears to communicate with the joint space. Inflammatory enhancement was noted in the joint space at C1-2 on the MRI scan. This likely represents a bone erosion or Schmorl's node) a metastatic deposit. No other focal lytic or blastic lesions are present. Soft tissues and spinal canal: Atherosclerotic calcifications are present at the carotid bifurcations bilaterally. There is some heterogeneity of the thyroid without a dominant lesion. Disc levels: Uncovertebral spurring contributes to left greater than right foraminal narrowing at C4-5. There is asymmetric facet hypertrophy on the left as well. Facet spurring contributes to left foraminal narrowing at C5-6. Upper chest: Centrilobular emphysema is noted. IMPRESSION: 1. Well-defined 6 mm hypodense lesion on the right at CT appears communicate with the joint. Given inflammatory changes of the joint on the previous MRI, this likely represents erosion or Schmorl's node. The metastatic disease is not entirely excluded, but considered much slightly in the absence of other lesions. Recent PET scan does not demonstrate other osseous lesions. 2. Multilevel spondylosis of the cervical spine as described, left greater than right. 3. Centrilobular emphysema. The patient's known lung nodule was not imaged. Electronically Signed   By: San Morelle M.D.   On: 10/14/2016 17:04   Mr Cervical Spine W Wo Contrast  Result Date: 10/10/2016 CLINICAL DATA:  Neck pain and stiffness.  Lung cancer. EXAM: MRI CERVICAL SPINE WITHOUT AND WITH CONTRAST TECHNIQUE: Multiplanar and multiecho pulse sequences of the cervical spine, to include the  craniocervical junction and cervicothoracic junction, were obtained without and with intravenous contrast. CONTRAST:  28mL MULTIHANCE GADOBENATE DIMEGLUMINE 529 MG/ML IV SOLN COMPARISON:  PET CT 06/22/2016 FINDINGS: Alignment: Grade 1 C4-C5 anterolisthesis. Vertebrae: There is a low T1/high T2 weighted signal within the superior aspect of the dens, with associated enhancement on postcontrast images, with a linear inferior margin. The aspect to the right of midline within the C2 body is round with more peripheral contrast enhancement. This is visualized on sagittal images only. There is no other focal marrow signal abnormality or contrast enhancement. Cord: Normal signal and caliber. Posterior Fossa, vertebral arteries, paraspinal tissues: Visualized posterior fossa is normal. Vertebral artery flow voids are preserved. Normal visualized paraspinal soft tissues. Disc levels: C1-C2: Normal. C2-C3: Normal disc space and facets. No spinal canal or neuroforaminal stenosis. C3-C4: Normal disc space and facets. No spinal canal or neuroforaminal stenosis. C4-C5: Left-greater-than-right facet hypertrophy and grade 1 anterolisthesis. No spinal canal stenosis. No nerve root impingement. C5-C6: Left-greater-than-right facet and uncovertebral hypertrophy without spinal canal or  neural foraminal stenosis. C6-C7: Normal disc space and facets. No spinal canal or neuroforaminal stenosis. C7-T1: Normal disc space and facets. No spinal canal or neuroforaminal stenosis. IMPRESSION: 1. T2 hyperintense, contrast-enhancing area within the superior aspect of the C2 vertebral body. In the context of a patient with known lung cancer, this is most consistent with a focus of metastatic disease. However, the distribution of the enhancement is somewhat atypical. A superimposed pathologic fracture would be difficult to exclude. CT of the cervical spine without contrast is recommended for more complete assessment of the vertebral body cortex. 2. No  spinal canal or neural foraminal stenosis. 3. Grade 1 anterolisthesis at C4-C5 secondary to facet hypertrophy. Electronically Signed   By: Ulyses Jarred M.D.   On: 10/10/2016 22:22     Recent Lab Findings: Lab Results  Component Value Date   WBC 7.1 03/27/2017   HGB 15.9 03/27/2017   HCT 47.3 03/27/2017   PLT 187 03/27/2017   GLUCOSE 95 03/27/2017   CHOL 243 (H) 07/11/2016   TRIG 102 07/11/2016   HDL 75 07/11/2016   LDLCALC 148 (H) 07/11/2016   ALT <6 03/27/2017   AST 13 03/27/2017   NA 137 03/27/2017   K 4.3 03/27/2017   CL 103 09/27/2016   CREATININE 0.9 03/27/2017   BUN 8.3 03/27/2017   CO2 25 03/27/2017   INR 1.04 09/27/2016      Assessment / Plan:    Patient doing well after a prolonged perioperative course, for resection of stage I a 3 carcinoma of the right upper lobe. Most recent CT shows no evidence of recurrence. Plan see the patient back in 8 months, he has a CT scan ordered by oncology for March 2019   [name] MD      [place] [place] [address] Office [phone]   Beeper [phone]  04/06/2017 10:16 AM

## 2017-04-17 ENCOUNTER — Other Ambulatory Visit: Payer: Self-pay | Admitting: *Deleted

## 2017-04-17 ENCOUNTER — Encounter: Payer: Self-pay | Admitting: *Deleted

## 2017-04-17 NOTE — Patient Outreach (Signed)
[facility]) Care Management  04/17/2017  [name] 05-04-1951 [id number]   04/12/17 Late Entry.  Initial High risk assessment call no answer, able to leave a hipaa compliant message.  10/15 Spoke with patient explained reason for the call. Patient discussed his lung surgery related to cancer in January of 2018, and his COPD condition.  Patient report he is doing well at this time, he voices understanding of action to take for increased COPD symptoms. Patient denies cost concerns related to medications.  Patient declines any additional education or resource needs at this time.    Patient agreeable to receiving follow up letter related to care management services. Verified address.   Plan  Will send follow up letter with [facility] contact information    Joylene Draft, RN, [place] Management Coordinator  [phone]- Mobile [phone]- [place]

## 2017-04-21 DIAGNOSIS — J449 Chronic obstructive pulmonary disease, unspecified: Secondary | ICD-10-CM | POA: Diagnosis not present

## 2017-04-21 DIAGNOSIS — J159 Unspecified bacterial pneumonia: Secondary | ICD-10-CM | POA: Diagnosis not present

## 2017-04-21 DIAGNOSIS — C3490 Malignant neoplasm of unspecified part of unspecified bronchus or lung: Secondary | ICD-10-CM | POA: Diagnosis not present

## 2017-04-21 DIAGNOSIS — R0602 Shortness of breath: Secondary | ICD-10-CM | POA: Diagnosis not present

## 2017-04-21 IMAGING — CR DG CHEST 1V PORT
1 series · 1 of 1 positions shown · non-contrast
Comparison: 08/13/2016

CLINICAL DATA: Pneumothorax

EXAM:
PORTABLE CHEST 1 VIEW

[AP]
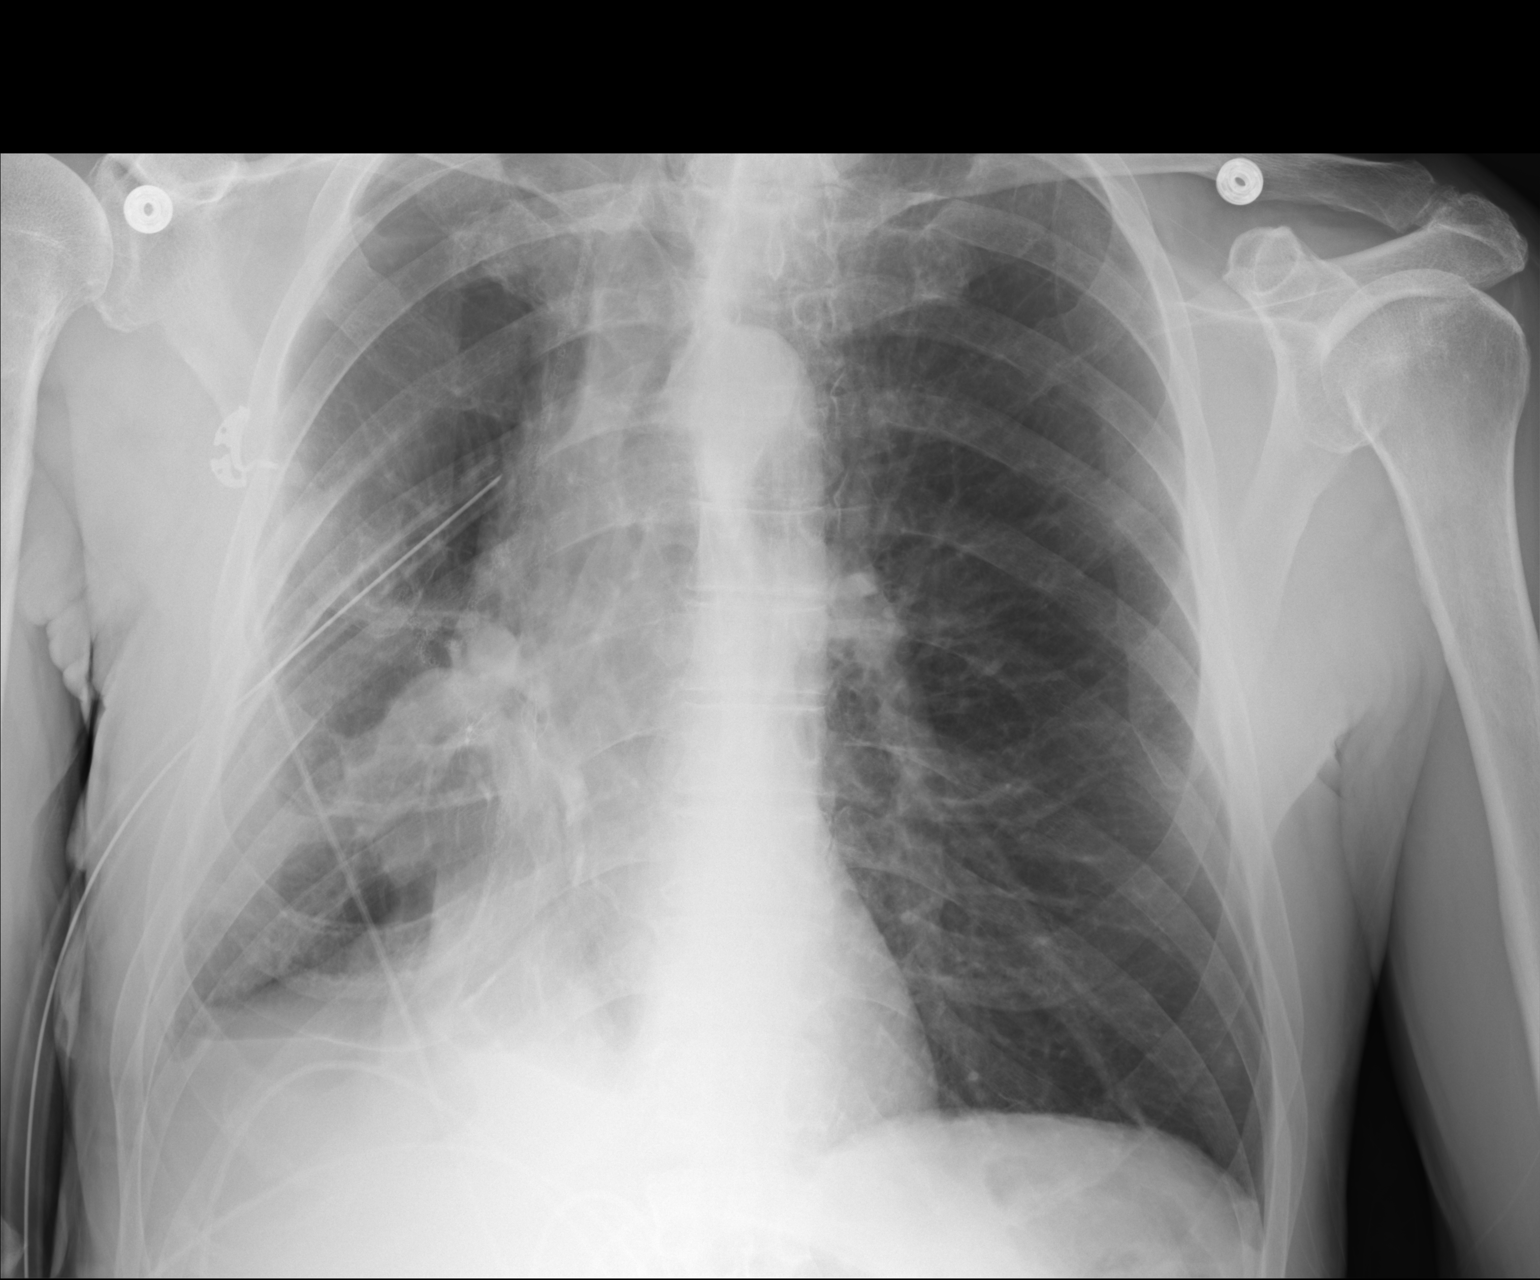

[1 of 1 positions shown; findings below may reference images not displayed]

FINDINGS: Right chest tube remains in place. Right apical pneumothorax
approximately 10% is slightly decreased. Postoperative changes on
the right. Possible small right effusion. Left lung is clear.
IMPRESSION: Right chest tube remains in place with approximately 10% right
apical pneumothorax, slightly smaller in size.

## 2017-04-28 DIAGNOSIS — J159 Unspecified bacterial pneumonia: Secondary | ICD-10-CM | POA: Diagnosis not present

## 2017-04-28 DIAGNOSIS — J441 Chronic obstructive pulmonary disease with (acute) exacerbation: Secondary | ICD-10-CM | POA: Diagnosis not present

## 2017-04-28 DIAGNOSIS — R0789 Other chest pain: Secondary | ICD-10-CM | POA: Diagnosis not present

## 2017-04-28 DIAGNOSIS — C3490 Malignant neoplasm of unspecified part of unspecified bronchus or lung: Secondary | ICD-10-CM | POA: Diagnosis not present

## 2017-05-02 IMAGING — CR DG CHEST 2V
2 series · 2 of 2 positions shown · non-contrast
Comparison: 08/15/2016

CLINICAL DATA: Followup chest surgery on the right. Right upper
lobectomy.

EXAM:
CHEST  2 VIEW

[w chest pa]
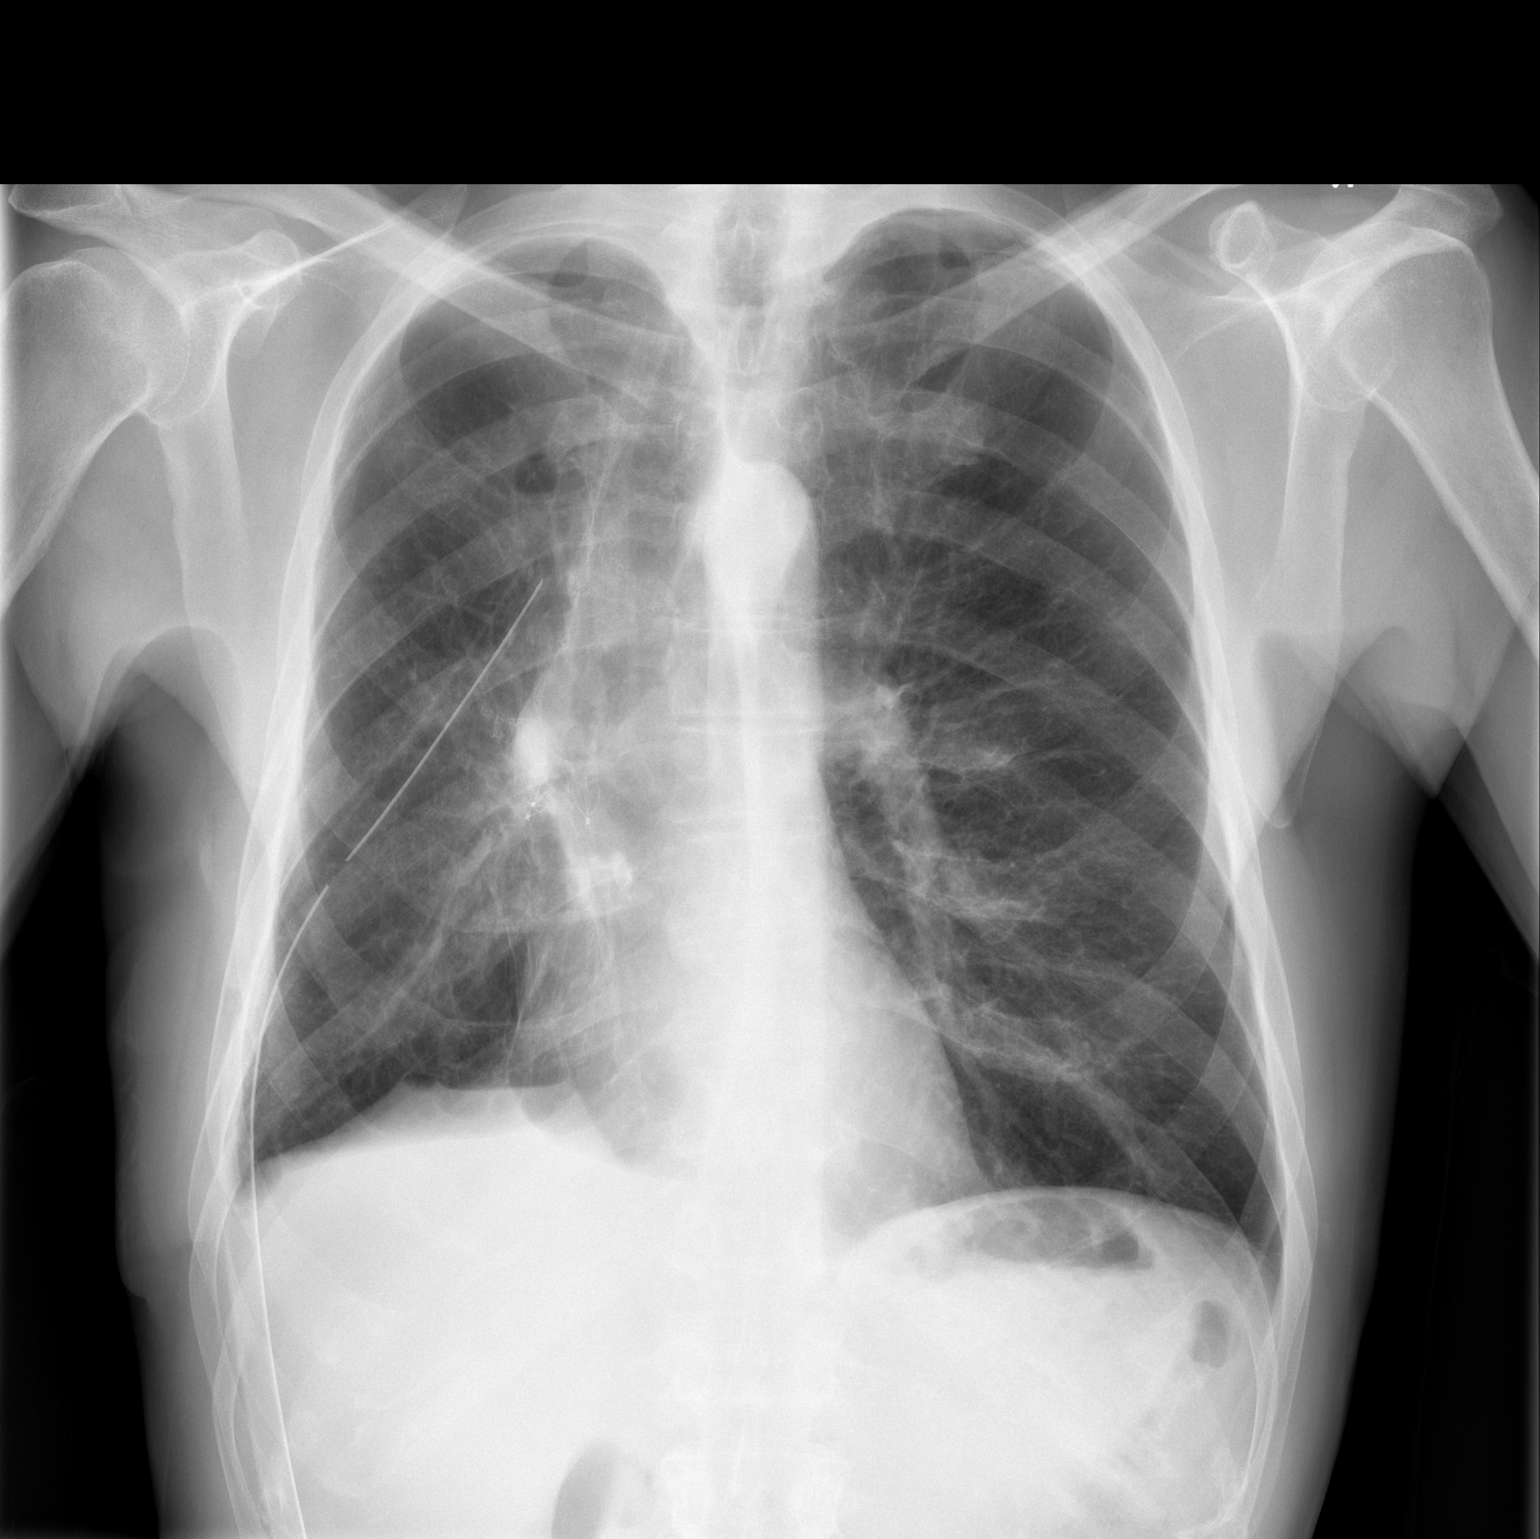

[w chest lat]
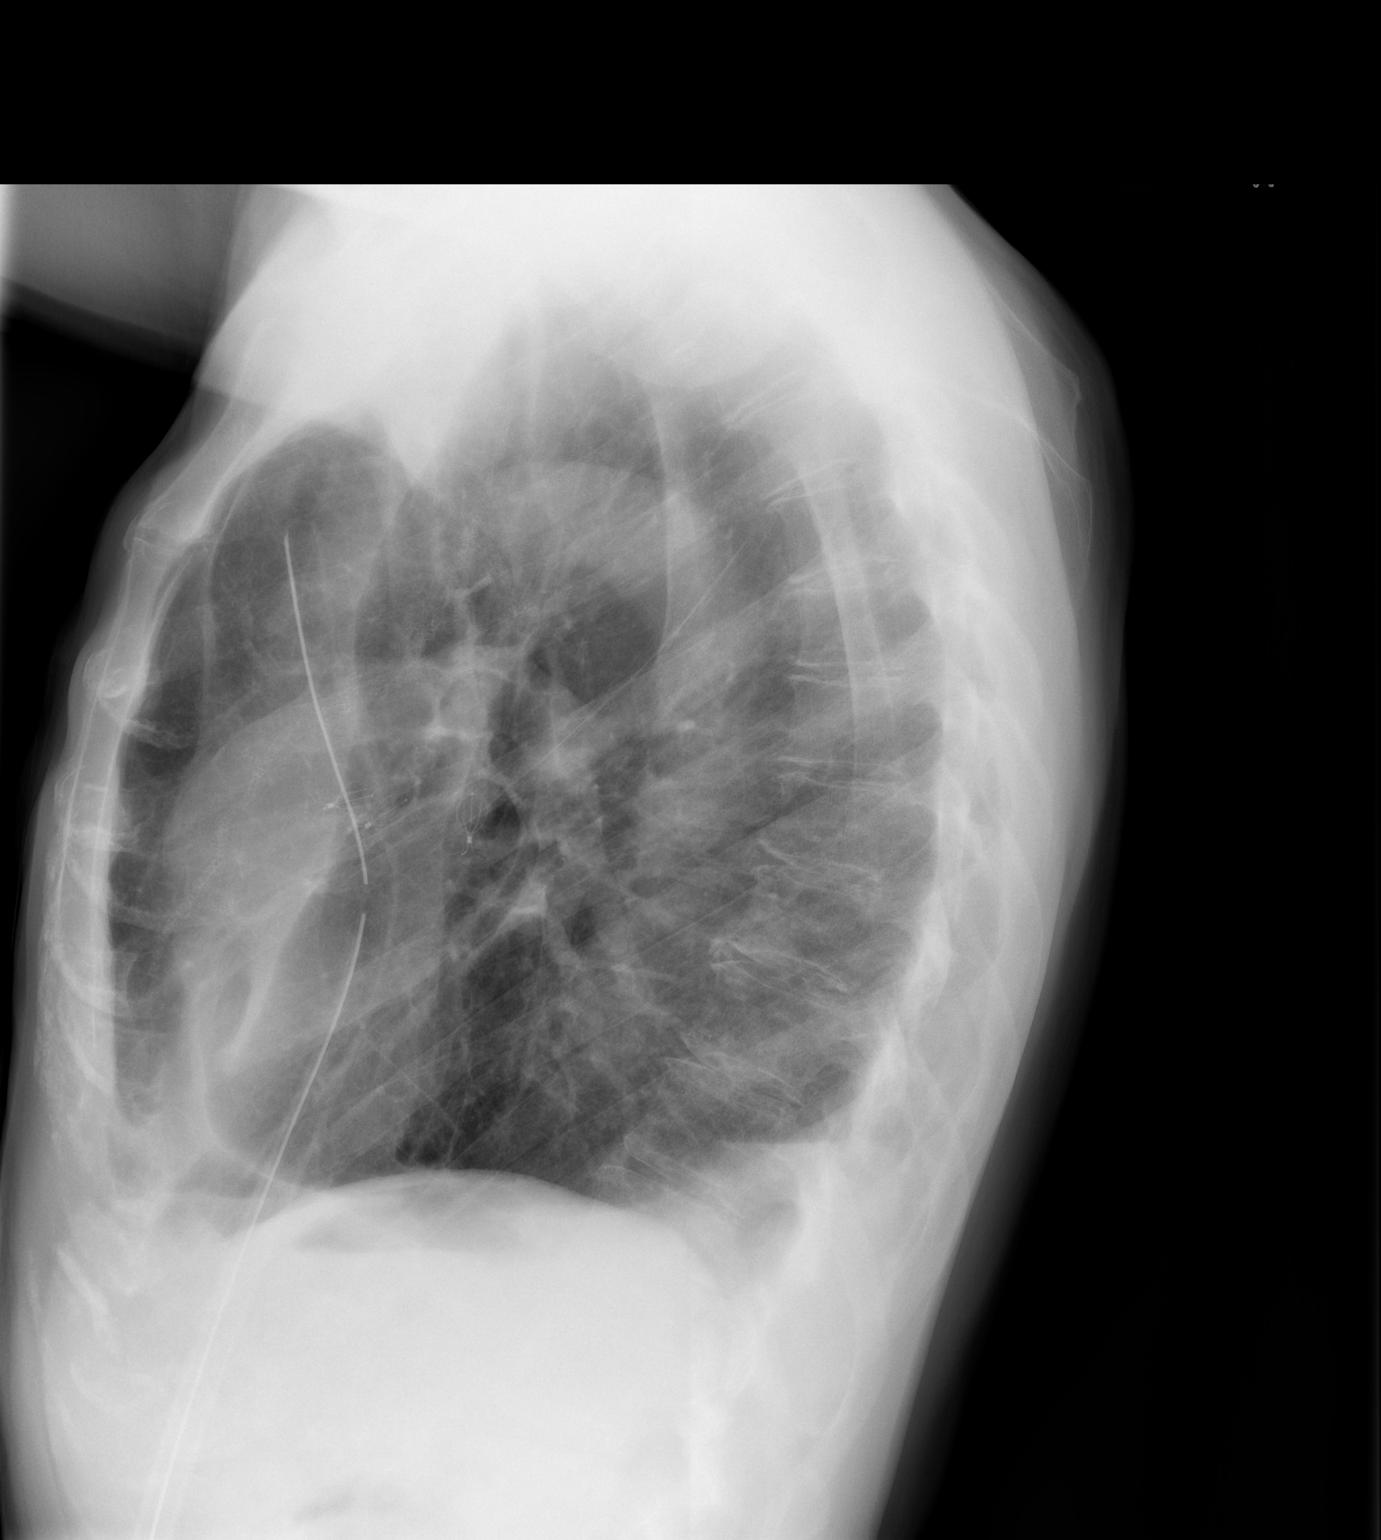

[2 of 2 positions shown; findings below may reference images not displayed]

FINDINGS: Right chest tube remains in place. There is no longer any visible
pleural air. Remaining pulmonary tissue as well aerated. No
effusions. No pneumonia or collapse.
IMPRESSION: Good postoperative appearance. Right chest remains in place. No
residual pleural air.

## 2017-05-02 IMAGING — DX DG CHEST 2V SAME DAY
2 series · 2 of 2 positions shown · non-contrast
Comparison: 08/25/2016.

CLINICAL DATA: Chest tube removal .

EXAM:
CHEST  2 VIEW

[dg chest 2v repeat same day (1 of 2)]
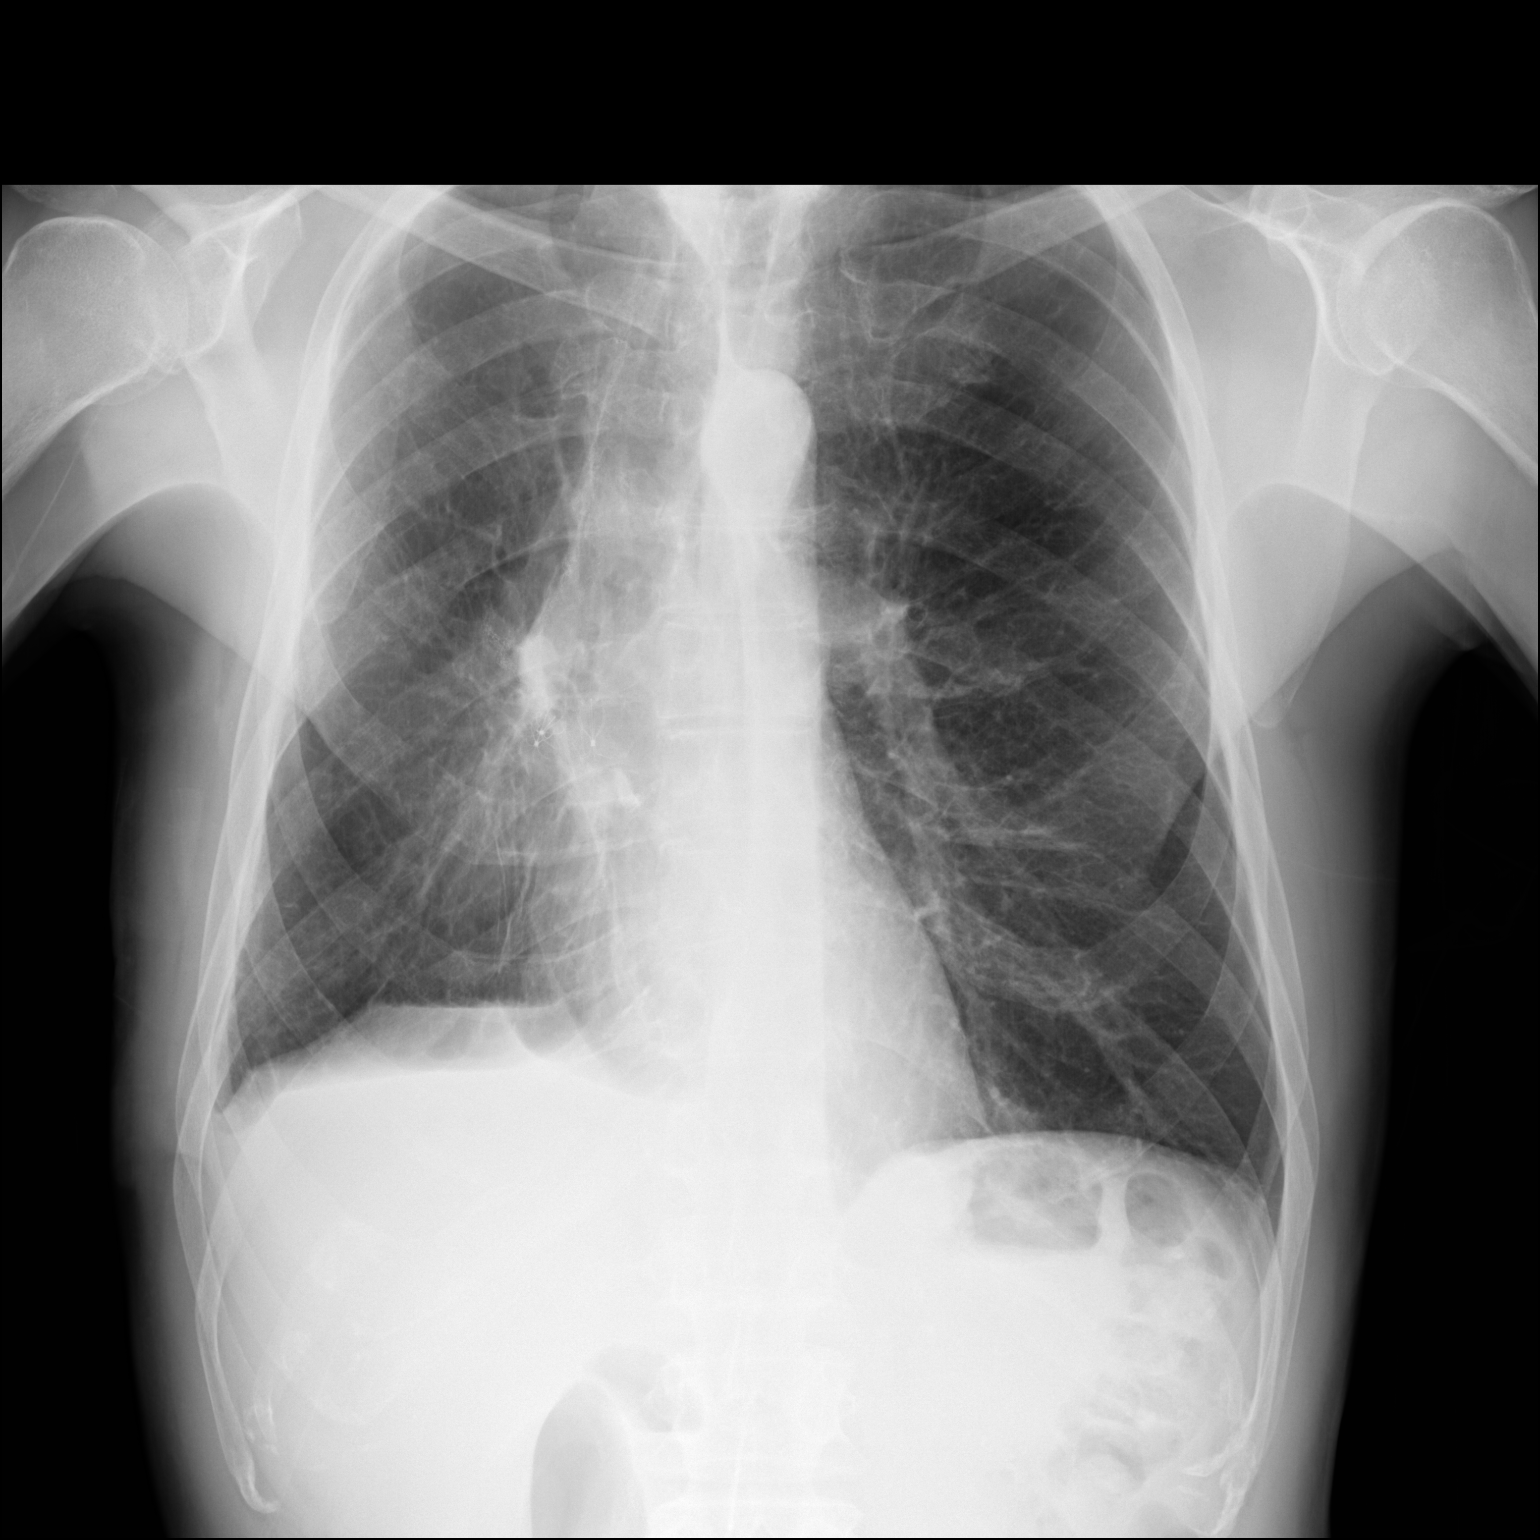

[dg chest 2v repeat same day (2 of 2)]
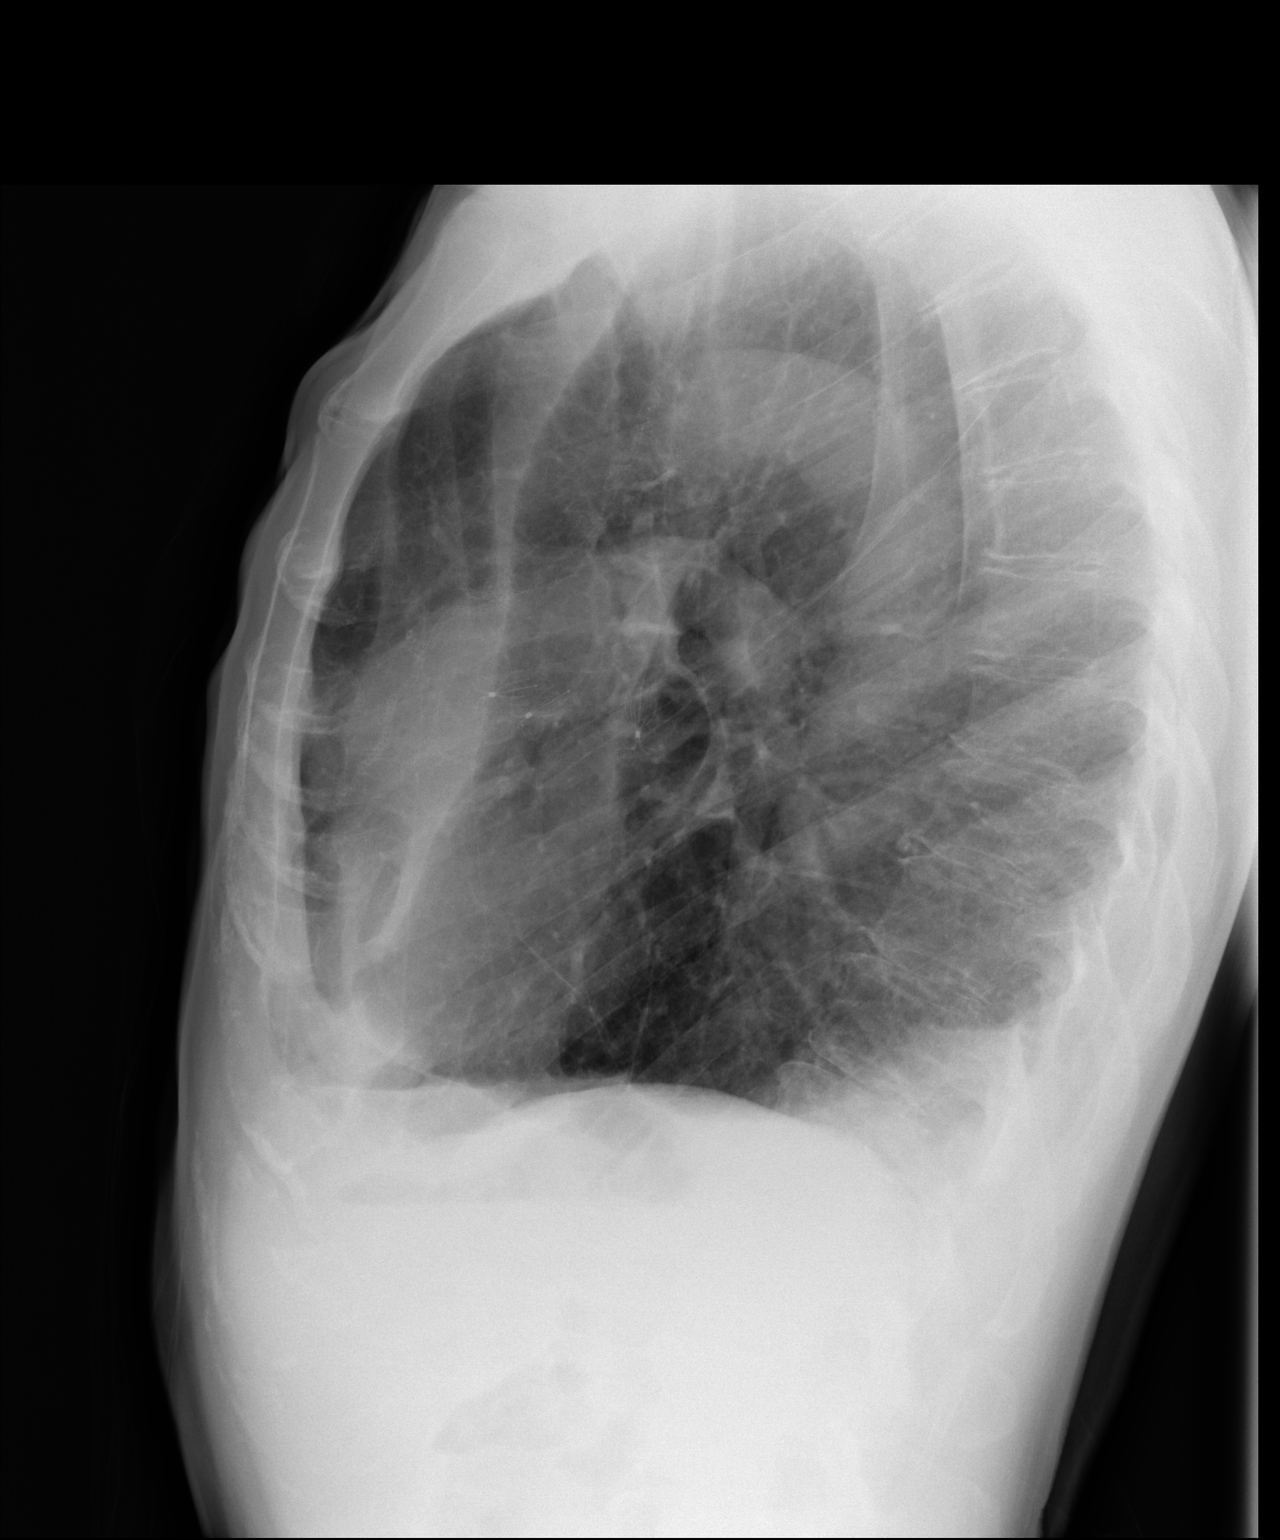

[2 of 2 positions shown; findings below may reference images not displayed]

FINDINGS: Interval removal right chest tube. No pneumothorax. Stable
postsurgical changes right chest. Stable right base pleural
thickening and/or small effusion. Heart size stable. No acute bony
abnormality .
IMPRESSION: 1.  Interim removal of right chest tube.  No pneumothorax.

2. Stable postsurgical changes right chest. Stable right base
pleural thickening and/or small effusion .

## 2017-05-26 ENCOUNTER — Ambulatory Visit: Payer: Self-pay | Admitting: Pulmonary Disease

## 2017-05-29 DIAGNOSIS — J159 Unspecified bacterial pneumonia: Secondary | ICD-10-CM | POA: Diagnosis not present

## 2017-05-29 DIAGNOSIS — R000 Tachycardia, unspecified: Secondary | ICD-10-CM | POA: Diagnosis not present

## 2017-05-29 DIAGNOSIS — J449 Chronic obstructive pulmonary disease, unspecified: Secondary | ICD-10-CM | POA: Diagnosis not present

## 2017-05-29 DIAGNOSIS — C3490 Malignant neoplasm of unspecified part of unspecified bronchus or lung: Secondary | ICD-10-CM | POA: Diagnosis not present

## 2017-05-31 ENCOUNTER — Ambulatory Visit: Payer: PPO | Admitting: Pulmonary Disease

## 2017-05-31 ENCOUNTER — Encounter: Payer: Self-pay | Admitting: Pulmonary Disease

## 2017-05-31 VITALS — BP 122/64 | HR 55 | Ht 72.0 in | Wt 164.4 lb

## 2017-05-31 DIAGNOSIS — J449 Chronic obstructive pulmonary disease, unspecified: Secondary | ICD-10-CM | POA: Diagnosis not present

## 2017-05-31 DIAGNOSIS — Z77090 Contact with and (suspected) exposure to asbestos: Secondary | ICD-10-CM

## 2017-05-31 DIAGNOSIS — C3491 Malignant neoplasm of unspecified part of right bronchus or lung: Secondary | ICD-10-CM

## 2017-05-31 MED ORDER — FORMOTEROL FUMARATE 20 MCG/2ML IN NEBU: 20.0000 ug | INHALATION_SOLUTION | Freq: Two times a day (BID) | RESPIRATORY_TRACT | 6 refills | Status: DC

## 2017-05-31 MED ORDER — BUDESONIDE 0.25 MG/2ML IN SUSP: 0.2500 mg | Freq: Two times a day (BID) | RESPIRATORY_TRACT | 6 refills | Status: DC

## 2017-05-31 NOTE — Progress Notes (Signed)
Subjective:    Patient ID: [name], male    DOB: 01-19-1951, 66 y.o.   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  HPI Treated for pneumonia with a Z-pak and reported infiltrates in his left lung. He reports his chest imaging in the middle of November is what showed his pneumonia.   Severe COPD:  Prescribed spacer to use with Symbicort at last appointment & continued on Spiriva Respimat. He reports his breathing is about at baseline. He does feel Albuterol in his nebulizer seems to help more than his rescue inhaler. He reports minimal wheezing. Cough is producing a clear mucus. He is using his Symbicort & Spiriva only intermittently. He didn't feel the spacer helped.   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  Right upper lobe adenocarcinoma: Status post right upper lobectomy by Dr. Madolyn Frieze. Follows with medical oncology. Repeat CT imaging in September shows no other signs of malignancy.   Review of Systems He reports near syncope and balance problems that are tied to an irregular heart beat. He is to be evaluated with ultrasound next week. He reports no new chest pain, pressure, or tightness. He has noticed intermittent palpitations with a heart rate of "over 200 beats per minute" that resolves after a minute or two. She reports no recent fever or chills.   No Known Allergies  Current Outpatient Medications on File Prior to Visit  Medication Sig Dispense Refill  . acetaminophen (EQL ARTHRITIS PAIN RELIEF) 650 MG CR tablet Take 650 mg by mouth every 8 (eight) hours as needed for pain.    Marland Kitchen albuterol (PROVENTIL HFA;VENTOLIN HFA) 108 (90 Base) MCG/ACT inhaler Inhale 2 puffs into the lungs every 6 (six) hours as needed for wheezing or shortness of breath.    Marland Kitchen albuterol (PROVENTIL) (2.5 MG/3ML) 0.083% nebulizer solution Take 2.5 mg by nebulization every 6 (six) hours as needed for wheezing or  shortness of breath.    Marland Kitchen aspirin EC 81 MG tablet Take 81 mg by mouth daily.    . budesonide-formoterol (SYMBICORT) 160-4.5 MCG/ACT inhaler Inhale 2 puffs into the lungs 2 (two) times daily. 1 Inhaler 3  . levalbuterol (XOPENEX HFA) 45 MCG/ACT inhaler Inhale 2 puffs into the lungs every 4 (four) hours as needed for shortness of breath. 1 Inhaler 1  . Spacer/Aero Chamber Mouthpiece MISC 1 Device by Does not apply route 2 (two) times daily. 1 each 0  . tamsulosin (FLOMAX) 0.4 MG CAPS capsule TAKE ONE CAPSULE BY MOUTH DAILY AFTER BREAKFAST 30 capsule 0  . Tiotropium Bromide Monohydrate (SPIRIVA RESPIMAT) 1.25 MCG/ACT AERS Inhale 2 puffs into the lungs daily. 2 Inhaler 0  . traMADol (ULTRAM) 50 MG tablet Take 1 tablet (50 mg total) by mouth every 12 (twelve) hours as needed for severe pain. 30 tablet 0   No current facility-administered medications on file prior to visit.     Past Medical History:  Diagnosis Date  . Adenocarcinoma of right lung, stage 1 ([place]) 09/26/2016  . Arthritis   . Cancer [facility])    pt states possible lung cancer to right lung  . COPD (chronic obstructive pulmonary disease) ([place])   . Dyspnea    with exertion  . Enlarged prostate   . Head injury    a. remotely with cranial plate in place.  Marland Kitchen Headache    neck cramps   . Lung mass    a. @ [facility] - 2.5  cm right upper lobe hypermetabolic mass highly suggestive of primary lung cancer.   . Pneumonia   . Tobacco abuse     Past Surgical History:  Procedure Laterality Date  . CARPAL TUNNEL RELEASE Right   . COLONOSCOPY    . ELBOW SURGERY     surgery x2 on right elbow  . EYE SURGERY     eye socket fracture, metal plate on left side of face  . HAND SURGERY     multiple hand surgeries on right hand  . LOBECTOMY Right 07/19/2016   Procedure: LOBECTOMY RIGHT UPPER LOBE WITH WEDGE RESECTION SUPERIOR SEGMENT RIGHT LOWER LOBE WITH PLACEMENT OF ON-Q;  Surgeon: [name], MD;  Location: [place];  Service: Thoracic;   Laterality: Right;  . LYMPH NODE DISSECTION Right 07/19/2016   Procedure: LYMPH NODE DISSECTION;  Surgeon: [name], MD;  Location: [place];  Service: Thoracic;  Laterality: Right;  . SKIN GRAFT     to right hand   . VIDEO ASSISTED THORACOSCOPY Right 08/04/2016   Procedure: RIGHT VIDEO ASSISTED THORACOSCOPY WITH SUTURE CLOSURE OF AIR LEAK;  Surgeon: [name], MD;  Location: [place];  Service: Thoracic;  Laterality: Right;  Marland Kitchen VIDEO ASSISTED THORACOSCOPY (VATS)/WEDGE RESECTION Right 07/19/2016   Procedure: VIDEO ASSISTED THORACOSCOPY (VATS);  Surgeon: [name], MD;  Location: [place];  Service: Thoracic;  Laterality: Right;  Marland Kitchen VIDEO BRONCHOSCOPY N/A 07/19/2016   Procedure: VIDEO BRONCHOSCOPY;  Surgeon: [name], MD;  Location: [place];  Service: Thoracic;  Laterality: N/A;  . VIDEO BRONCHOSCOPY N/A 09/28/2016   Procedure: VIDEO BRONCHOSCOPY, with general anesthesia for removal of interbronchial valves x 3;  Surgeon: [name], MD;  Location: [place];  Service: Thoracic;  Laterality: N/A;  . VIDEO BRONCHOSCOPY WITH INSERTION OF INTERBRONCHIAL VALVE (IBV) Right 08/01/2016   Procedure: VIDEO BRONCHOSCOPY WITH INSERTION OF INTERBRONCHIAL VALVE (IBV);  Surgeon: [name], MD;  Location: [place];  Service: Thoracic;  Laterality: Right;  . WEDGE RESECTION Right 07/19/2016   Procedure: WEDGE  RESECTION RIGHT UPPER LOBE;  Surgeon: [name], MD;  [facility];  Service: Thoracic;  Laterality: Right;    Family History  Problem Relation Age of Onset  . Aneurysm Mother        Brain  . Other Mother        Pacemaker  . Colon cancer Father   . Heart disease Father        CABG in his 34s, passed at 67  . Hypercholesterolemia Brother   . Lung cancer Paternal Uncle     Social History   Socioeconomic History  . Marital status: Married    Spouse name: None  . Number of children: None  . Years of education: None  . Highest education level: None  Social  Needs  . Financial resource strain: None  . Food insecurity - worry: None  . Food insecurity - inability: None  . Transportation needs - medical: None  . Transportation needs - non-medical: None  Occupational History  . None  Tobacco Use  . Smoking status: Former Smoker    Packs/day: 1.00    Years: 50.00    Pack years: 50.00    Types: Cigarettes    Last attempt to quit: 07/19/2016    Years since quitting: 0.8  . Smokeless tobacco: Never Used  Substance and Sexual Activity  . Alcohol use: No  . Drug use: No  . Sexual activity: None  Other  Topics Concern  . None  Social History Narrative   [facility] (12/22/16):   Originally from [place]. Works in Set designer. He is married. No pets currently. No bird, mold, or hot tub exposure. 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       Objective:   Physical Exam BP 122/64 (BP Location: Left Arm, Cuff Size: Normal)   Pulse (!) 55   Ht 6' (1.829 m)   Wt 164 lb 6.4 oz (74.6 kg)   SpO2 94%   BMI 22.30 kg/m   General:  Awake. No distress. Comfortable.  Integument:  No rash. Warm. Dry.. Extremities:  No cyanosis or clubbing.  HEENT:  Moist mucus membranes. No no nasal turbinate swelling. No oral ulcers. Cardiovascular:  Regular rate. No edema. No appreciable JVD.  Pulmonary:  Symmetrically decreased breath sounds. Clear with auscultation. Normal work of breathing. Abdomen: Soft. Normal bowel sounds. Nondistended.  Musculoskeletal:  Normal bulk and tone. Hand grip strength 5/5 bilaterally. No joint deformity or effusion appreciated. Neurological:  Cranial nerves 2-12 grossly in tact. No meningismus. Moving all 4 extremities equally.   PFT 01/26/17: FVC 3.52 L (71%) FEV1 1.70 L (46%) FEV1/FVC 0.48 FEF 25-75 0.67 L (23%) positive bronchodilator response TLC 7.15 L (96%) RV 144% ERV 89% DLC corrected 28% 06/15/16: FVC 4.37 L (98%) FEV1 1.71 L (49%) FEV1/FVC 0.40 FEF 25-75 0.39 L  (11%) negative bronchodilator response TLC 8.26 L (121%) RV 133% DLCO uncorrected 55%  6MWT 02/02/17:  Walked 435 meters / Baseline Sat 100% on RA / Nadir Sat 96% on RA  IMAGING CT CHEST W/ CONTRAST 03/27/17 (per radiologist): IMPRESSION: 1. Interval right upper lobe resection without demonstrated complication. There is a small amount of loculated pleural fluid on the right. 2. No evidence of local recurrence or metastatic disease. No suspicious pulmonary nodules. 3. Aortic Atherosclerosis (ICD10-I70.0) and Emphysema (ICD10-J43.9).  CXR PA/LAT 11/10/16 (previously reviewed by me):  Small right pleural effusion with tenting of the diaphragm and volume loss on the right. No parenchymal mass or opacity appreciated. Heart normal in size & mediastinum normal in contour.  PATHOLOGY RUL WEDGE/LOBECTOMY BIOPSY (07/19/16):  Invasive Adenocarcinoma measuring 2.5cm w/ Calcified Pleural Plaque  Lymph Nodes 4R/10R/11R/12R (07/19/16):  Negative for malignancy  LABS 12/22/16 Alpha-1 antitrypsin: MM (197)    Assessment & Plan:  66 y.o.  male with underlying severe COPD and asbestos exposure. We reviewed his chest CT scan which does not show recurrence of his malignancy but does reveal his underlying significant emphysema. Symptomatically he seems to do better with nebulizer medication rather than inhalers. As such, I am switching him over to a nebulizer regimen. I instructed the patient to contact my office if he had any new breathing problems or questions before his next appointment. We will continue monitoring of his previous asbestos exposure with pulmonary function testing and imaging in conjunction with his prior lung cancer.  1. Pneumonia: Patient following up with PCP with imaging to ensure complete resolution.  2. Severe COPD: Switching from Symbicort/Spiriva to Pulmicort and Perforomist. 3. Asbestos exposure: Continuing monitoring with chest imaging. 4. Right upper lobe adeocarcinoma: No signs of  recurrence on most recent CT scan. Continuing to follow with medical oncology. 5. Health maintenance: Patient previously declined immunizations. 6. Follow-up: Return to clinic in 3 months or sooner if needed.  Sonia Baller Ashok Cordia, M.D. [facility] Pager:  [phone] After 3pm or if no response, call [phone] 10:37 AM 05/31/17

## 2017-05-31 NOTE — Patient Instructions (Addendum)
   Continue using the Albuterol in your nebulizer as needed.  We are going to switch you from Symbicort/Spiriva over to Perforomist and Pulmicort nebulized twice daily. Do not mix the medications. Make sure you use them regularly as prescribed.  Remember to remove any dentures or partials you have before you use the Pulmicort. Remember to brush your teeth & tongue after you use Pulmicort as well as rinse, gargle & spit to keep from getting thrush in your mouth or on your tongue (a white film).   Contact my office if you have any new breathing problems or questions before your next appointment.  We will see you back in 3 months or sooner if needed.

## 2017-06-01 ENCOUNTER — Telehealth: Payer: Self-pay | Admitting: Pulmonary Disease

## 2017-06-01 MED ORDER — FORMOTEROL FUMARATE 20 MCG/2ML IN NEBU: 20.0000 ug | INHALATION_SOLUTION | Freq: Two times a day (BID) | RESPIRATORY_TRACT | 6 refills | Status: DC

## 2017-06-01 MED ORDER — BUDESONIDE 0.25 MG/2ML IN SUSP: 0.2500 mg | Freq: Two times a day (BID) | RESPIRATORY_TRACT | 6 refills | Status: DC

## 2017-06-01 NOTE — Telephone Encounter (Signed)
I printed out the Rx's requested and gave to pt in the lobby. Nothing further is needed.

## 2017-06-07 ENCOUNTER — Telehealth: Payer: Self-pay | Admitting: Pulmonary Disease

## 2017-06-07 DIAGNOSIS — R000 Tachycardia, unspecified: Secondary | ICD-10-CM | POA: Diagnosis not present

## 2017-06-07 MED ORDER — FORMOTEROL FUMARATE 20 MCG/2ML IN NEBU: 20.0000 ug | INHALATION_SOLUTION | Freq: Two times a day (BID) | RESPIRATORY_TRACT | 6 refills | Status: DC

## 2017-06-07 MED ORDER — BUDESONIDE 0.25 MG/2ML IN SUSP: 0.2500 mg | Freq: Two times a day (BID) | RESPIRATORY_TRACT | 6 refills | Status: DC

## 2017-06-07 NOTE — Telephone Encounter (Signed)
Called [facility] to see what was needed. Pharmacist stated that the patient needs a Part B vs Part D determination via [employer].   Called [employer] at [phone]. Spoke with Bria. Answered questions regarding the medication and location of patient. Both medications will need to be billed under Part B.   Spoke with Dian Situ at pharmacy. Advised him of the part B. He verbalized understanding. Nothing else needed at time of call.

## 2017-06-07 NOTE — Telephone Encounter (Signed)
I sent the [facility] to [facility] on [address] with the diagnosis codes on them. Nothing further is needed.

## 2017-06-14 DIAGNOSIS — I272 Pulmonary hypertension, unspecified: Secondary | ICD-10-CM | POA: Diagnosis not present

## 2017-06-14 DIAGNOSIS — R0602 Shortness of breath: Secondary | ICD-10-CM | POA: Diagnosis not present

## 2017-06-14 DIAGNOSIS — R000 Tachycardia, unspecified: Secondary | ICD-10-CM | POA: Diagnosis not present

## 2017-06-14 DIAGNOSIS — I071 Rheumatic tricuspid insufficiency: Secondary | ICD-10-CM | POA: Diagnosis not present

## 2017-06-15 DIAGNOSIS — M79606 Pain in leg, unspecified: Secondary | ICD-10-CM | POA: Diagnosis not present

## 2017-06-15 DIAGNOSIS — R0989 Other specified symptoms and signs involving the circulatory and respiratory systems: Secondary | ICD-10-CM | POA: Diagnosis not present

## 2017-06-21 DIAGNOSIS — R000 Tachycardia, unspecified: Secondary | ICD-10-CM | POA: Diagnosis not present

## 2017-07-05 DIAGNOSIS — I491 Atrial premature depolarization: Secondary | ICD-10-CM | POA: Diagnosis not present

## 2017-07-05 DIAGNOSIS — I6523 Occlusion and stenosis of bilateral carotid arteries: Secondary | ICD-10-CM | POA: Diagnosis not present

## 2017-07-05 DIAGNOSIS — R000 Tachycardia, unspecified: Secondary | ICD-10-CM | POA: Diagnosis not present

## 2017-07-05 DIAGNOSIS — I739 Peripheral vascular disease, unspecified: Secondary | ICD-10-CM | POA: Diagnosis not present

## 2017-09-29 ENCOUNTER — Inpatient Hospital Stay: Payer: PPO | Attending: Internal Medicine

## 2017-09-29 ENCOUNTER — Ambulatory Visit (HOSPITAL_COMMUNITY)
Admission: RE | Admit: 2017-09-29 | Discharge: 2017-09-29 | Disposition: A | Discharge status: Home / Self Care | Payer: PPO | Source: Ambulatory Visit | Attending: Internal Medicine | Admitting: Internal Medicine

## 2017-09-29 DIAGNOSIS — C3491 Malignant neoplasm of unspecified part of right bronchus or lung: Secondary | ICD-10-CM | POA: Diagnosis not present

## 2017-09-29 DIAGNOSIS — C3411 Malignant neoplasm of upper lobe, right bronchus or lung: Secondary | ICD-10-CM | POA: Insufficient documentation

## 2017-09-29 DIAGNOSIS — I700 Atherosclerosis of aorta: Secondary | ICD-10-CM | POA: Diagnosis not present

## 2017-09-29 DIAGNOSIS — C3490 Malignant neoplasm of unspecified part of unspecified bronchus or lung: Secondary | ICD-10-CM

## 2017-09-29 DIAGNOSIS — J432 Centrilobular emphysema: Secondary | ICD-10-CM | POA: Insufficient documentation

## 2017-09-29 LAB — CBC WITH DIFFERENTIAL/PLATELET
BASOS ABS: 0.1 10*3/uL (ref 0.0–0.1)
Basophils Relative: 1 %
EOS ABS: 0.1 10*3/uL (ref 0.0–0.5)
EOS PCT: 1 %
HCT: 45.1 % (ref 38.4–49.9)
Hemoglobin: 14.9 g/dL (ref 13.0–17.1)
Lymphocytes Relative: 14 %
Lymphs Abs: 1.3 10*3/uL (ref 0.9–3.3)
MCH: 31.4 pg (ref 27.2–33.4)
MCHC: 33.1 g/dL (ref 32.0–36.0)
MCV: 94.7 fL (ref 79.3–98.0)
MONO ABS: 0.9 10*3/uL (ref 0.1–0.9)
Monocytes Relative: 10 %
Neutro Abs: 7.3 10*3/uL — ABNORMAL HIGH (ref 1.5–6.5)
Neutrophils Relative %: 74 %
PLATELETS: 195 10*3/uL (ref 140–400)
RBC: 4.76 MIL/uL (ref 4.20–5.82)
RDW: 14.4 % (ref 11.0–14.6)
WBC: 9.7 10*3/uL (ref 4.0–10.3)

## 2017-09-29 LAB — COMPREHENSIVE METABOLIC PANEL
ALT: 18 U/L (ref 0–55)
AST: 15 U/L (ref 5–34)
Albumin: 3.7 g/dL (ref 3.5–5.0)
Alkaline Phosphatase: 98 U/L (ref 40–150)
Anion gap: 8 (ref 3–11)
BILIRUBIN TOTAL: 0.4 mg/dL (ref 0.2–1.2)
BUN: 9 mg/dL (ref 7–26)
CO2: 24 mmol/L (ref 22–29)
CREATININE: 0.91 mg/dL (ref 0.70–1.30)
Calcium: 9.5 mg/dL (ref 8.4–10.4)
Chloride: 110 mmol/L — ABNORMAL HIGH (ref 98–109)
GFR calc Af Amer: >60 mL/min (ref >60)
Glucose, Bld: 95 mg/dL (ref 70–140)
Potassium: 4.5 mmol/L (ref 3.5–5.1)
Sodium: 142 mmol/L (ref 136–145)
TOTAL PROTEIN: 6.8 g/dL (ref 6.4–8.3)

## 2017-09-29 MED ORDER — IOPAMIDOL (ISOVUE-300) INJECTION 61%
75.0000 mL | Freq: Once | INTRAVENOUS | Status: AC | PRN
  Administered 2017-09-29: 75 mL via INTRAVENOUS

## 2017-09-29 MED ORDER — IOPAMIDOL (ISOVUE-300) INJECTION 61%
INTRAVENOUS | Status: AC
  Filled 2017-09-29: qty 75

## 2017-10-05 ENCOUNTER — Inpatient Hospital Stay: Payer: PPO | Attending: Internal Medicine | Admitting: Internal Medicine

## 2017-10-05 ENCOUNTER — Encounter: Payer: Self-pay | Admitting: Internal Medicine

## 2017-10-05 ENCOUNTER — Telehealth: Payer: Self-pay | Admitting: Internal Medicine

## 2017-10-05 VITALS — BP 128/81 | HR 60 | Temp 97.6°F | Resp 18 | Wt 166.4 lb

## 2017-10-05 DIAGNOSIS — J449 Chronic obstructive pulmonary disease, unspecified: Secondary | ICD-10-CM

## 2017-10-05 DIAGNOSIS — Z79899 Other long term (current) drug therapy: Secondary | ICD-10-CM | POA: Insufficient documentation

## 2017-10-05 DIAGNOSIS — R0602 Shortness of breath: Secondary | ICD-10-CM | POA: Diagnosis not present

## 2017-10-05 DIAGNOSIS — C3491 Malignant neoplasm of unspecified part of right bronchus or lung: Secondary | ICD-10-CM

## 2017-10-05 DIAGNOSIS — F17210 Nicotine dependence, cigarettes, uncomplicated: Secondary | ICD-10-CM | POA: Diagnosis not present

## 2017-10-05 DIAGNOSIS — C3411 Malignant neoplasm of upper lobe, right bronchus or lung: Secondary | ICD-10-CM | POA: Diagnosis not present

## 2017-10-05 DIAGNOSIS — Z8701 Personal history of pneumonia (recurrent): Secondary | ICD-10-CM | POA: Insufficient documentation

## 2017-10-05 DIAGNOSIS — M129 Arthropathy, unspecified: Secondary | ICD-10-CM | POA: Diagnosis not present

## 2017-10-05 DIAGNOSIS — Z7982 Long term (current) use of aspirin: Secondary | ICD-10-CM | POA: Diagnosis not present

## 2017-10-05 DIAGNOSIS — C3490 Malignant neoplasm of unspecified part of unspecified bronchus or lung: Secondary | ICD-10-CM

## 2017-10-05 NOTE — Progress Notes (Signed)
[place] Telephone:([phone]) [phone]   Fax:([phone]) [phone]  OFFICE PROGRESS NOTE  Gaye Alken, PA-C [place] [address]  DIAGNOSIS: Stage IA (T1b, N0, M0) non-small cell lung cancer, well-differentiated adenocarcinoma presented with right lower lobe lung nodule diagnosed in January 2018.  PRIOR THERAPY: Video bronchoscopy in addition to right upper lobectomy and wedge resection of the superior segment of the right lower lobe with lymph node dissection under the care of Dr. Servando Snare on 07/19/2016.  CURRENT THERAPY: Observation.  INTERVAL HISTORY: [name] 67 y.o. male returns to the clinic today for follow-up visit.  The patient is feeling fine today with no specific complaints except for intermittent right-sided chest pain.  He denied having any shortness of breath but has intermittent nagging cough with no hemoptysis.  He was diagnosed with pneumonia twice in the last 6 months.  He has no current fever or chills.  He has no nausea, vomiting, diarrhea or constipation.  He denied having any recent weight loss or night sweats.  He is here today for evaluation with repeat CT scan of the chest for restaging of his disease.  MEDICAL HISTORY: Past Medical History:  Diagnosis Date  . Adenocarcinoma of right lung, stage 1 ([place]) 09/26/2016  . Arthritis   . Cancer [facility])    pt states possible lung cancer to right lung  . COPD (chronic obstructive pulmonary disease) ([place])   . Dyspnea    with exertion  . Enlarged prostate   . Head injury    a. remotely with cranial plate in place.  Marland Kitchen Headache    neck cramps   . Lung mass    a. @ [facility] - 2.5 cm right upper lobe hypermetabolic mass highly suggestive of primary lung cancer.   . Pneumonia   . Tobacco abuse     ALLERGIES:  has No Known Allergies.  MEDICATIONS:  Current Outpatient Medications  Medication Sig Dispense Refill  . acetaminophen (EQL ARTHRITIS PAIN RELIEF) 650 MG CR tablet Take 650  mg by mouth every 8 (eight) hours as needed for pain.    Marland Kitchen albuterol (PROVENTIL HFA;VENTOLIN HFA) 108 (90 Base) MCG/ACT inhaler Inhale 2 puffs into the lungs every 6 (six) hours as needed for wheezing or shortness of breath.    Marland Kitchen albuterol (PROVENTIL) (2.5 MG/3ML) 0.083% nebulizer solution Take 2.5 mg by nebulization every 6 (six) hours as needed for wheezing or shortness of breath.    Marland Kitchen aspirin EC 81 MG tablet Take 81 mg by mouth daily.    . budesonide (PULMICORT) 0.25 MG/2ML nebulizer solution Take 2 mLs (0.25 mg total) by nebulization 2 (two) times daily. DX: J44.9 60 mL 6  . formoterol (PERFOROMIST) 20 MCG/2ML nebulizer solution Take 2 mLs (20 mcg total) by nebulization 2 (two) times daily. DX: J44.9 60 mL 6  . levalbuterol (XOPENEX HFA) 45 MCG/ACT inhaler Inhale 2 puffs into the lungs every 4 (four) hours as needed for shortness of breath. 1 Inhaler 1  . Spacer/Aero Chamber Mouthpiece MISC 1 Device by Does not apply route 2 (two) times daily. 1 each 0  . tamsulosin (FLOMAX) 0.4 MG CAPS capsule TAKE ONE CAPSULE BY MOUTH DAILY AFTER BREAKFAST 30 capsule 0  . Tiotropium Bromide Monohydrate (SPIRIVA RESPIMAT) 1.25 MCG/ACT AERS Inhale 2 puffs into the lungs daily. 2 Inhaler 0  . traMADol (ULTRAM) 50 MG tablet Take 1 tablet (50 mg total) by mouth every 12 (twelve) hours as needed for severe pain. 30 tablet  0   No current facility-administered medications for this visit.     SURGICAL HISTORY:  Past Surgical History:  Procedure Laterality Date  . CARPAL TUNNEL RELEASE Right   . COLONOSCOPY    . ELBOW SURGERY     surgery x2 on right elbow  . EYE SURGERY     eye socket fracture, metal plate on left side of face  . HAND SURGERY     multiple hand surgeries on right hand  . LOBECTOMY Right 07/19/2016   Procedure: LOBECTOMY RIGHT UPPER LOBE WITH WEDGE RESECTION SUPERIOR SEGMENT RIGHT LOWER LOBE WITH PLACEMENT OF ON-Q;  Surgeon: [name], MD;  Location: [place];  Service: Thoracic;   Laterality: Right;  . LYMPH NODE DISSECTION Right 07/19/2016   Procedure: LYMPH NODE DISSECTION;  Surgeon: [name], MD;  Location: [place];  Service: Thoracic;  Laterality: Right;  . SKIN GRAFT     to right hand   . VIDEO ASSISTED THORACOSCOPY Right 08/04/2016   Procedure: RIGHT VIDEO ASSISTED THORACOSCOPY WITH SUTURE CLOSURE OF AIR LEAK;  Surgeon: [name], MD;  Location: [place];  Service: Thoracic;  Laterality: Right;  Marland Kitchen VIDEO ASSISTED THORACOSCOPY (VATS)/WEDGE RESECTION Right 07/19/2016   Procedure: VIDEO ASSISTED THORACOSCOPY (VATS);  Surgeon: [name], MD;  Location: [place];  Service: Thoracic;  Laterality: Right;  Marland Kitchen VIDEO BRONCHOSCOPY N/A 07/19/2016   Procedure: VIDEO BRONCHOSCOPY;  Surgeon: [name], MD;  Location: [place];  Service: Thoracic;  Laterality: N/A;  . VIDEO BRONCHOSCOPY N/A 09/28/2016   Procedure: VIDEO BRONCHOSCOPY, with general anesthesia for removal of interbronchial valves x 3;  Surgeon: [name], MD;  Location: [place];  Service: Thoracic;  Laterality: N/A;  . VIDEO BRONCHOSCOPY WITH INSERTION OF INTERBRONCHIAL VALVE (IBV) Right 08/01/2016   Procedure: VIDEO BRONCHOSCOPY WITH INSERTION OF INTERBRONCHIAL VALVE (IBV);  Surgeon: [name], MD;  Location: [place];  Service: Thoracic;  Laterality: Right;  . WEDGE RESECTION Right 07/19/2016   Procedure: WEDGE  RESECTION RIGHT UPPER LOBE;  Surgeon: [name], MD;  [facility];  Service: Thoracic;  Laterality: Right;    REVIEW OF SYSTEMS:  A comprehensive review of systems was negative except for: Respiratory: positive for cough   PHYSICAL EXAMINATION: General appearance: alert, cooperative and no distress Head: Normocephalic, without obvious abnormality, atraumatic Neck: no adenopathy, no JVD, supple, symmetrical, trachea midline and thyroid not enlarged, symmetric, no tenderness/mass/nodules Lymph nodes: Cervical, supraclavicular, and axillary nodes normal. Resp: clear to  auscultation bilaterally Back: symmetric, no curvature. ROM normal. No CVA tenderness. Cardio: regular rate and rhythm, S1, S2 normal, no murmur, click, rub or gallop GI: soft, non-tender; bowel sounds normal; no masses,  no organomegaly Extremities: extremities normal, atraumatic, no cyanosis or edema  ECOG PERFORMANCE STATUS: 1 - Symptomatic but completely ambulatory  Blood pressure 128/81, pulse 60, temperature 97.6 F (36.4 C), temperature source Oral, resp. rate 18, weight 166 lb 6.4 oz (75.5 kg), SpO2 100 %.  LABORATORY DATA: Lab Results  Component Value Date   WBC 9.7 09/29/2017   HGB 14.9 09/29/2017   HCT 45.1 09/29/2017   MCV 94.7 09/29/2017   PLT 195 09/29/2017      Chemistry      Component Value Date/Time   NA 142 09/29/2017 1105   NA 137 03/27/2017 0944   K 4.5 09/29/2017 1105   K 4.3 03/27/2017 0944   CL 110 (H) 09/29/2017 1105   CO2 24 09/29/2017 1105   CO2 25  03/27/2017 0944   BUN 9 09/29/2017 1105   BUN 8.3 03/27/2017 0944   CREATININE 0.91 09/29/2017 1105   CREATININE 0.9 03/27/2017 0944      Component Value Date/Time   CALCIUM 9.5 09/29/2017 1105   CALCIUM 9.7 03/27/2017 0944   ALKPHOS 98 09/29/2017 1105   ALKPHOS 103 03/27/2017 0944   AST 15 09/29/2017 1105   AST 13 03/27/2017 0944   ALT 18 09/29/2017 1105   ALT <6 03/27/2017 0944   BILITOT 0.4 09/29/2017 1105   BILITOT 0.53 03/27/2017 0944       RADIOGRAPHIC STUDIES: Ct Chest W Contrast  Result Date: 09/29/2017 CLINICAL DATA:  Right lung cancer. Status post right upper lobectomy. Chronic cough and shortness of breath. EXAM: CT CHEST WITH CONTRAST TECHNIQUE: Multidetector CT imaging of the chest was performed during intravenous contrast administration. CONTRAST:  67mL ISOVUE-300 IOPAMIDOL (ISOVUE-300) INJECTION 61% COMPARISON:  03/27/2017 FINDINGS: Cardiovascular: The heart size is normal. No pericardial effusion. Coronary artery calcification is evident. Atherosclerotic calcification is noted  in the wall of the thoracic aorta. Mediastinum/Nodes: No mediastinal lymphadenopathy. There is no hilar lymphadenopathy. The esophagus has normal imaging features. There is no axillary lymphadenopathy. Lungs/Pleura: Centrilobular emphysema noted. Surgical changes in the right hilum are compatible with the patient's reported history of right upper lobectomy. No suspicious pulmonary nodule or mass. No focal airspace consolidation. No pulmonary edema pleural effusion. Upper Abdomen: Unremarkable. Musculoskeletal: Bone windows reveal no worrisome lytic or sclerotic osseous lesions. IMPRESSION: 1. No new or progressive interval findings to suggest recurrent or metastatic disease in the chest. 2. Interval resolution of right pleural effusion. 3.  Emphysema. (ICD10-J43.9) 4.  Aortic Atherosclerois (ICD10-170.0) Electronically Signed   By: Misty Stanley M.D.   On: 09/29/2017 14:19    ASSESSMENT AND PLAN: this is a very pleasant 67 years old white male with history of stage IA non-small cell lung cancer status post right upper lobectomy with lymph node dissection in January 2018. He is current on observation.  He is feeling fine with no specific complaints except for intermittent cough. The patient had repeat CT scan of the chest performed recently.  The scan showed no concerning findings for disease recurrence or metastasis. I discussed the scan results with the patient and recommended for him to continue in observation with repeat CT scan of the chest in 6 months. He was advised to call immediately if he has any concerning symptoms in the interval. The patient voices understanding of current disease status and treatment options and is in agreement with the current care plan. All questions were answered. The patient knows to call the clinic with any problems, questions or concerns. We can certainly see the patient much sooner if necessary.  I spent 10 minutes counseling the patient face to face. The total time  spent in the appointment was 15 minutes.  Disclaimer: This note was dictated with voice recognition software. Similar sounding words can inadvertently be transcribed and may not be corrected upon review.

## 2017-10-05 NOTE — Telephone Encounter (Signed)
Appointments scheduled AVS/Calendar printed per 4/4 los

## 2017-10-06 DIAGNOSIS — M25511 Pain in right shoulder: Secondary | ICD-10-CM | POA: Diagnosis not present

## 2017-10-06 DIAGNOSIS — C3490 Malignant neoplasm of unspecified part of unspecified bronchus or lung: Secondary | ICD-10-CM | POA: Diagnosis not present

## 2017-10-06 DIAGNOSIS — J449 Chronic obstructive pulmonary disease, unspecified: Secondary | ICD-10-CM | POA: Diagnosis not present

## 2017-10-06 DIAGNOSIS — E782 Mixed hyperlipidemia: Secondary | ICD-10-CM | POA: Diagnosis not present

## 2017-10-10 DIAGNOSIS — Z79899 Other long term (current) drug therapy: Secondary | ICD-10-CM | POA: Diagnosis not present

## 2017-10-10 DIAGNOSIS — M5030 Other cervical disc degeneration, unspecified cervical region: Secondary | ICD-10-CM | POA: Diagnosis not present

## 2017-10-24 DIAGNOSIS — M5030 Other cervical disc degeneration, unspecified cervical region: Secondary | ICD-10-CM | POA: Diagnosis not present

## 2017-10-24 DIAGNOSIS — Z79899 Other long term (current) drug therapy: Secondary | ICD-10-CM | POA: Diagnosis not present

## 2017-11-07 DIAGNOSIS — M5030 Other cervical disc degeneration, unspecified cervical region: Secondary | ICD-10-CM | POA: Diagnosis not present

## 2017-11-07 DIAGNOSIS — I10 Essential (primary) hypertension: Secondary | ICD-10-CM | POA: Diagnosis not present

## 2017-11-07 DIAGNOSIS — Z79899 Other long term (current) drug therapy: Secondary | ICD-10-CM | POA: Diagnosis not present

## 2017-11-09 DIAGNOSIS — K529 Noninfective gastroenteritis and colitis, unspecified: Secondary | ICD-10-CM | POA: Diagnosis not present

## 2017-11-09 DIAGNOSIS — Z0000 Encounter for general adult medical examination without abnormal findings: Secondary | ICD-10-CM | POA: Diagnosis not present

## 2017-11-09 DIAGNOSIS — J449 Chronic obstructive pulmonary disease, unspecified: Secondary | ICD-10-CM | POA: Diagnosis not present

## 2017-11-09 DIAGNOSIS — I10 Essential (primary) hypertension: Secondary | ICD-10-CM | POA: Diagnosis not present

## 2017-11-09 DIAGNOSIS — M542 Cervicalgia: Secondary | ICD-10-CM | POA: Diagnosis not present

## 2017-11-09 DIAGNOSIS — Z131 Encounter for screening for diabetes mellitus: Secondary | ICD-10-CM | POA: Diagnosis not present

## 2017-11-09 DIAGNOSIS — G8929 Other chronic pain: Secondary | ICD-10-CM | POA: Diagnosis not present

## 2017-11-09 DIAGNOSIS — E7800 Pure hypercholesterolemia, unspecified: Secondary | ICD-10-CM | POA: Diagnosis not present

## 2017-11-15 ENCOUNTER — Encounter: Payer: Self-pay | Admitting: Pulmonary Disease

## 2017-11-15 ENCOUNTER — Ambulatory Visit: Payer: PPO | Admitting: Pulmonary Disease

## 2017-11-15 ENCOUNTER — Telehealth: Payer: Self-pay | Admitting: Pulmonary Disease

## 2017-11-15 VITALS — BP 104/62 | HR 58 | Ht 72.0 in | Wt 159.0 lb

## 2017-11-15 DIAGNOSIS — Z77090 Contact with and (suspected) exposure to asbestos: Secondary | ICD-10-CM

## 2017-11-15 DIAGNOSIS — R1310 Dysphagia, unspecified: Secondary | ICD-10-CM

## 2017-11-15 DIAGNOSIS — J449 Chronic obstructive pulmonary disease, unspecified: Secondary | ICD-10-CM

## 2017-11-15 DIAGNOSIS — R059 Cough, unspecified: Secondary | ICD-10-CM

## 2017-11-15 DIAGNOSIS — C3491 Malignant neoplasm of unspecified part of right bronchus or lung: Secondary | ICD-10-CM | POA: Diagnosis not present

## 2017-11-15 DIAGNOSIS — R05 Cough: Secondary | ICD-10-CM | POA: Diagnosis not present

## 2017-11-15 MED ORDER — FLUTICASONE-UMECLIDIN-VILANT 100-62.5-25 MCG/INH IN AEPB: 1.0000 | INHALATION_SPRAY | Freq: Every day | RESPIRATORY_TRACT | 0 refills | Status: DC

## 2017-11-15 NOTE — Telephone Encounter (Signed)
July

## 2017-11-15 NOTE — Telephone Encounter (Signed)
Will hold message until July schedule is laid later this week.

## 2017-11-15 NOTE — Patient Instructions (Signed)
Severe COPD: Take Trelegy 1 puff daily no matter how you feel.  While taking this inhaler do not take the Symbicort or Spiriva.  Let us know if you think this helps her breathing.  If it does we can call in a prescription for this medicine. Continue albuterol as needed for chest tightness wheezing or shortness of breath  Cough: I worried this may be related to underlying acid reflux.  However, lisinopril can also cause cough. Try taking an over-the-counter antacid like Prilosec 20 mg daily for the next 30 days.  If no improvement in cough and on the next visit we will have to change the lisinopril to something else.  History of lung cancer. Continue follow-up with the oncology clinic.  Follow-up with [place] in 4 to 6 weeks to see how the cough is doing after taking the antacid.

## 2017-11-15 NOTE — Telephone Encounter (Signed)
No availability on BQ's schedule in June, no July schedule laid yet.  Pt declined appt with NP, will only schedule with BQ.  BQ please advise if pt can be worked in to schedule, or can be seen in July after schedule opens.  Thanks.

## 2017-11-15 NOTE — Progress Notes (Signed)
Synopsis:Former patient of Dr. Ashok Cordia with COPD, lung cancer and asbestos exposure  Subjective:   PATIENT ID: [name] GENDER: male DOB: 1950-09-16, MRN: [id number]   HPI  Chief Complaint  Patient presents with  . Follow-up    former [facility] patient, COPD, CAT 29   He tells me that he feels like his breathing has worsened.  He says that whenever he bends over he feels more short of breath.  He feels like he is drowning at this time.  However when he sits back up his breathing improves.  He continues to have a cough.  He says the cough is significantly worse when he lay flat.  He frequently has trouble with food going down the wrong pipe and this will make him cough.  He says that he still feels pain in the right side of his chest.  He feels like there is still fluid collection over there.  Past Medical History:  Diagnosis Date  . Adenocarcinoma of right lung, stage 1 ([place]) 09/26/2016  . Arthritis   . Cancer [facility]    pt states possible lung cancer to right lung  . COPD (chronic obstructive pulmonary disease) ([place])   . Dyspnea    with exertion  . Enlarged prostate   . Head injury    a. remotely with cranial plate in place.  Marland Kitchen Headache    neck cramps   . Lung mass    a. @ [facility] - 2.5 cm right upper lobe hypermetabolic mass highly suggestive of primary lung cancer.   . Pneumonia   . Tobacco abuse      Family History  Problem Relation Age of Onset  . Aneurysm Mother        Brain  . Other Mother        Pacemaker  . Colon cancer Father   . Heart disease Father        CABG in his 76s, passed at 10  . Hypercholesterolemia Brother   . Lung cancer Paternal Uncle      Social History   Socioeconomic History  . Marital status: Married    Spouse name: Not on file  . Number of children: Not on file  . Years of education: Not on file  . Highest education level: Not on file  Occupational History  . Not on file  Social Needs  . Financial resource strain: Not on file  . Food  insecurity:    Worry: Not on file    Inability: Not on file  . Transportation needs:    Medical: Not on file    Non-medical: Not on file  Tobacco Use  . Smoking status: Former Smoker    Packs/day: 1.00    Years: 50.00    Pack years: 50.00    Types: Cigarettes    Last attempt to quit: 07/19/2016    Years since quitting: 1.3  . Smokeless tobacco: Never Used  Substance and Sexual Activity  . Alcohol use: No  . Drug use: No  . Sexual activity: Not on file  Lifestyle  . Physical activity:    Days per week: Not on file    Minutes per session: Not on file  . Stress: Not on file  Relationships  . Social connections:    Talks on phone: Not on file    Gets together: Not on file    Attends religious service: Not on file    Active member of club or organization: Not on file  Attends meetings of clubs or organizations: Not on file    Relationship status: Not on file  . Intimate partner violence:    Fear of current or ex partner: Not on file    Emotionally abused: Not on file    Physically abused: Not on file    Forced sexual activity: Not on file  Other Topics Concern  . Not on file  Social History Narrative   [facility] (12/22/16):   Originally from [place]. Works in Set designer. He is married. No pets currently. No bird, mold, or hot tub exposure. 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      No Known Allergies   Outpatient Medications Prior to Visit  Medication Sig Dispense Refill  . acetaminophen (EQL ARTHRITIS PAIN RELIEF) 650 MG CR tablet Take 650 mg by mouth every 8 (eight) hours as needed for pain.    Marland Kitchen albuterol (PROVENTIL HFA;VENTOLIN HFA) 108 (90 Base) MCG/ACT inhaler Inhale 2 puffs into the lungs every 6 (six) hours as needed for wheezing or shortness of breath.    Marland Kitchen albuterol (PROVENTIL) (2.5 MG/3ML) 0.083% nebulizer solution Take 2.5 mg by nebulization every 6 (six) hours as needed for wheezing or shortness  of breath.    Marland Kitchen aspirin EC 81 MG tablet Take 81 mg by mouth daily.    Marland Kitchen atorvastatin (LIPITOR) 10 MG tablet Take 10 mg by mouth daily.    . budesonide (PULMICORT) 0.25 MG/2ML nebulizer solution Take 2 mLs (0.25 mg total) by nebulization 2 (two) times daily. DX: J44.9 60 mL 6  . formoterol (PERFOROMIST) 20 MCG/2ML nebulizer solution Take 2 mLs (20 mcg total) by nebulization 2 (two) times daily. DX: J44.9 60 mL 6  . levalbuterol (XOPENEX HFA) 45 MCG/ACT inhaler Inhale 2 puffs into the lungs every 4 (four) hours as needed for shortness of breath. 1 Inhaler 1  . lisinopril (PRINIVIL,ZESTRIL) 10 MG tablet Take 10 mg by mouth daily.    Marland Kitchen Spacer/Aero Chamber Mouthpiece MISC 1 Device by Does not apply route 2 (two) times daily. 1 each 0  . tamsulosin (FLOMAX) 0.4 MG CAPS capsule TAKE ONE CAPSULE BY MOUTH DAILY AFTER BREAKFAST 30 capsule 0  . Tiotropium Bromide Monohydrate (SPIRIVA RESPIMAT) 1.25 MCG/ACT AERS Inhale 2 puffs into the lungs daily. 2 Inhaler 0  . traMADol (ULTRAM) 50 MG tablet Take 1 tablet (50 mg total) by mouth every 12 (twelve) hours as needed for severe pain. 30 tablet 0   No facility-administered medications prior to visit.     Review of Systems  Constitutional: Negative for diaphoresis, fever, malaise/fatigue and weight loss.  HENT: Positive for congestion. Negative for nosebleeds and sore throat.   Respiratory: Positive for cough. Negative for sputum production, shortness of breath and wheezing.   Cardiovascular: Negative for chest pain and leg swelling.      Objective:  Physical Exam   Vitals:   11/15/17 1524  BP: 104/62  Pulse: (!) 58  SpO2: 98%  Weight: 159 lb (72.1 kg)  Height: 6' (1.829 m)    Gen: chronically ill appearing HENT: OP clear, TM's clear, neck supple PULM: CTA B, normal percussion CV: RRR, no mgr, trace edema GI: BS+, soft, nontender Derm: no cyanosis or rash Psyche: normal mood and affect   CBC    Component Value Date/Time   WBC 9.7  09/29/2017 1105   RBC 4.76 09/29/2017 1105   HGB 14.9 09/29/2017 1105   HGB 15.9 03/27/2017 0944  HCT 45.1 09/29/2017 1105   HCT 47.3 03/27/2017 0944   PLT 195 09/29/2017 1105   PLT 187 03/27/2017 0944   PLT 194 07/08/2016 0944   MCV 94.7 09/29/2017 1105   MCV 93.1 03/27/2017 0944   MCH 31.4 09/29/2017 1105   MCHC 33.1 09/29/2017 1105   RDW 14.4 09/29/2017 1105   RDW 14.6 03/27/2017 0944   LYMPHSABS 1.3 09/29/2017 1105   LYMPHSABS 1.5 03/27/2017 0944   MONOABS 0.9 09/29/2017 1105   MONOABS 0.7 03/27/2017 0944   EOSABS 0.1 09/29/2017 1105   EOSABS 0.1 03/27/2017 0944   EOSABS 0.1 07/08/2016 0944   BASOSABS 0.1 09/29/2017 1105   BASOSABS 0.0 03/27/2017 0944     PFT 01/26/17: FVC 3.52 L (71%) FEV1 1.70 L (46%) FEV1/FVC 0.48 FEF 25-75 0.67 L (23%) positive bronchodilator response TLC 7.15 L (96%) RV 144% ERV 89% DLC corrected 28% 06/15/16: FVC 4.37 L (98%) FEV1 1.71 L (49%) FEV1/FVC 0.40 FEF 25-75 0.39 L (11%) negative bronchodilator response TLC 8.26 L (121%) RV 133% DLCO uncorrected 55%  6MWT 02/02/17:  Walked 435 meters / Baseline Sat 100% on RA / Nadir Sat 96% on RA  IMAGING CT CHEST W/ CONTRAST 03/27/17: IMPRESSION: 1. Interval right upper lobe resection without demonstrated complication. There is a small amount of loculated pleural fluid on the right. 2. No evidence of local recurrence or metastatic disease. No suspicious pulmonary nodules. 3. Aortic Atherosclerosis (ICD10-I70.0) and Emphysema (ICD10-J43.9).  CXR PA/LAT 11/10/16:  Small right pleural effusion with tenting of the diaphragm and volume loss on the right. No parenchymal mass or opacity appreciated. Heart normal in size & mediastinum normal in contour.  PATHOLOGY RUL WEDGE/LOBECTOMY BIOPSY (07/19/16):  Invasive Adenocarcinoma measuring 2.5cm w/ Calcified Pleural Plaque  Lymph Nodes 4R/10R/11R/12R (07/19/16):  Negative for malignancy  LABS 12/22/16 Alpha-1 antitrypsin: MM (197)        Assessment &  Plan:   Dysphagia, unspecified type - Plan: SLP modified barium swallow  COPD, severe ([place])  Asbestos exposure  Non-small cell cancer of right lung ([place])  Cough  Discussion: He is symptomatic from his severe COPD but has not had an exacerbation recently.  His medication regimen is appropriate but I wonder if he would do better with a simpler regimen so we will give him samples of Trelegy to try today.  He describes a cough with symptoms suggestive of underlying acid reflux.  The differential diagnosis includes lisinopril related cough.  We will try treating with an antacid for a month, but if no improvement in cough then we need to stop the lisinopril.  Plan: Severe COPD: Take Trelegy 1 puff daily no matter how you feel.  While taking this inhaler do not take the Symbicort or Spiriva.  Let us know if you think this helps her breathing.  If it does we can call in a prescription for this medicine. Continue albuterol as needed for chest tightness wheezing or shortness of breath  Cough: I worried this may be related to underlying acid reflux.  However, lisinopril can also cause cough. Try taking an over-the-counter antacid like Prilosec 20 mg daily for the next 30 days.  If no improvement in cough and on the next visit we will have to change the lisinopril to something else.  Dysphagia: Modified barium swallow  History of lung cancer. Continue follow-up with the oncology clinic.  Follow-up with [place] in 4 to 6 weeks to see how the cough is doing after taking the antacid.  > 50% of  this 25 minute visit spent face to face    Current Outpatient Medications:  .  acetaminophen (EQL ARTHRITIS PAIN RELIEF) 650 MG CR tablet, Take 650 mg by mouth every 8 (eight) hours as needed for pain., Disp: , Rfl:  .  albuterol (PROVENTIL HFA;VENTOLIN HFA) 108 (90 Base) MCG/ACT inhaler, Inhale 2 puffs into the lungs every 6 (six) hours as needed for wheezing or shortness of breath., Disp: , Rfl:  .   albuterol (PROVENTIL) (2.5 MG/3ML) 0.083% nebulizer solution, Take 2.5 mg by nebulization every 6 (six) hours as needed for wheezing or shortness of breath., Disp: , Rfl:  .  aspirin EC 81 MG tablet, Take 81 mg by mouth daily., Disp: , Rfl:  .  atorvastatin (LIPITOR) 10 MG tablet, Take 10 mg by mouth daily., Disp: , Rfl:  .  budesonide (PULMICORT) 0.25 MG/2ML nebulizer solution, Take 2 mLs (0.25 mg total) by nebulization 2 (two) times daily. DX: J44.9, Disp: 60 mL, Rfl: 6 .  formoterol (PERFOROMIST) 20 MCG/2ML nebulizer solution, Take 2 mLs (20 mcg total) by nebulization 2 (two) times daily. DX: J44.9, Disp: 60 mL, Rfl: 6 .  levalbuterol (XOPENEX HFA) 45 MCG/ACT inhaler, Inhale 2 puffs into the lungs every 4 (four) hours as needed for shortness of breath., Disp: 1 Inhaler, Rfl: 1 .  lisinopril (PRINIVIL,ZESTRIL) 10 MG tablet, Take 10 mg by mouth daily., Disp: , Rfl:  .  Spacer/Aero Chamber Mouthpiece MISC, 1 Device by Does not apply route 2 (two) times daily., Disp: 1 each, Rfl: 0 .  tamsulosin (FLOMAX) 0.4 MG CAPS capsule, TAKE ONE CAPSULE BY MOUTH DAILY AFTER BREAKFAST, Disp: 30 capsule, Rfl: 0 .  Tiotropium Bromide Monohydrate (SPIRIVA RESPIMAT) 1.25 MCG/ACT AERS, Inhale 2 puffs into the lungs daily., Disp: 2 Inhaler, Rfl: 0 .  traMADol (ULTRAM) 50 MG tablet, Take 1 tablet (50 mg total) by mouth every 12 (twelve) hours as needed for severe pain., Disp: 30 tablet, Rfl: 0 .  Fluticasone-Umeclidin-Vilant (TRELEGY ELLIPTA) 100-62.5-25 MCG/INH AEPB, Inhale 1 puff into the lungs daily., Disp: 1 each, Rfl: 0

## 2017-11-16 ENCOUNTER — Other Ambulatory Visit (HOSPITAL_COMMUNITY): Payer: Self-pay | Admitting: Pulmonary Disease

## 2017-11-16 DIAGNOSIS — R1319 Other dysphagia: Secondary | ICD-10-CM

## 2017-11-16 NOTE — Telephone Encounter (Signed)
Spoke with pt. He has been scheduled with Dr. [place] Bells on 01/09/18 at [place]. Nothing further was needed.

## 2017-11-21 DIAGNOSIS — Z79899 Other long term (current) drug therapy: Secondary | ICD-10-CM | POA: Diagnosis not present

## 2017-11-21 DIAGNOSIS — R197 Diarrhea, unspecified: Secondary | ICD-10-CM | POA: Diagnosis not present

## 2017-11-21 DIAGNOSIS — M5030 Other cervical disc degeneration, unspecified cervical region: Secondary | ICD-10-CM | POA: Diagnosis not present

## 2017-11-22 ENCOUNTER — Other Ambulatory Visit: Payer: Self-pay | Admitting: Nurse Practitioner

## 2017-11-22 DIAGNOSIS — M5030 Other cervical disc degeneration, unspecified cervical region: Secondary | ICD-10-CM

## 2017-11-24 ENCOUNTER — Ambulatory Visit (HOSPITAL_COMMUNITY)
Admission: RE | Admit: 2017-11-24 | Discharge: 2017-11-24 | Disposition: A | Discharge status: Home / Self Care | Payer: PPO | Source: Ambulatory Visit | Attending: Pulmonary Disease | Admitting: Pulmonary Disease

## 2017-11-24 DIAGNOSIS — R05 Cough: Secondary | ICD-10-CM | POA: Diagnosis not present

## 2017-11-24 DIAGNOSIS — R1310 Dysphagia, unspecified: Secondary | ICD-10-CM

## 2017-11-24 DIAGNOSIS — R1319 Other dysphagia: Secondary | ICD-10-CM

## 2017-12-04 ENCOUNTER — Ambulatory Visit
Admission: RE | Admit: 2017-12-04 | Discharge: 2017-12-04 | Disposition: A | Discharge status: Home / Self Care | Payer: PPO | Source: Ambulatory Visit | Attending: Nurse Practitioner | Admitting: Nurse Practitioner

## 2017-12-04 DIAGNOSIS — M5030 Other cervical disc degeneration, unspecified cervical region: Secondary | ICD-10-CM

## 2017-12-04 DIAGNOSIS — M542 Cervicalgia: Secondary | ICD-10-CM | POA: Diagnosis not present

## 2017-12-07 ENCOUNTER — Telehealth: Payer: Self-pay | Admitting: Internal Medicine

## 2017-12-07 NOTE — Telephone Encounter (Signed)
Faxed medical records to [facility] at [phone] on 12/07/17, Release ID: [id number]

## 2017-12-19 DIAGNOSIS — M5030 Other cervical disc degeneration, unspecified cervical region: Secondary | ICD-10-CM | POA: Diagnosis not present

## 2017-12-19 DIAGNOSIS — Z79899 Other long term (current) drug therapy: Secondary | ICD-10-CM | POA: Diagnosis not present

## 2017-12-25 DIAGNOSIS — M47812 Spondylosis without myelopathy or radiculopathy, cervical region: Secondary | ICD-10-CM | POA: Diagnosis not present

## 2017-12-28 ENCOUNTER — Encounter: Payer: PPO | Admitting: Cardiothoracic Surgery

## 2018-01-09 ENCOUNTER — Ambulatory Visit: Payer: PPO | Admitting: Pulmonary Disease

## 2018-01-09 NOTE — Progress Notes (Deleted)
Synopsis:Former patient of Dr. Ashok Cordia with COPD, lung cancer and asbestos exposure  Subjective:   PATIENT ID: [name] GENDER: male DOB: 04-20-1951, MRN: [id number]   HPI  No chief complaint on file.  ***  Past Medical History:  Diagnosis Date  . Adenocarcinoma of right lung, stage 1 ([facility]) 09/26/2016  . Arthritis   . Cancer [facility])    pt states possible lung cancer to right lung  . COPD (chronic obstructive pulmonary disease) ([place])   . Dyspnea    with exertion  . Enlarged prostate   . Head injury    a. remotely with cranial plate in place.  Marland Kitchen Headache    neck cramps   . Lung mass    a. @ [facility] - 2.5 cm right upper lobe hypermetabolic mass highly suggestive of primary lung cancer.   . Pneumonia   . Tobacco abuse       Review of Systems  Constitutional: Negative for diaphoresis, fever, malaise/fatigue and weight loss.  HENT: Positive for congestion. Negative for nosebleeds and sore throat.   Respiratory: Positive for cough. Negative for sputum production, shortness of breath and wheezing.   Cardiovascular: Negative for chest pain and leg swelling.      Objective:  Physical Exam   There were no vitals filed for this visit.  ***  CBC    Component Value Date/Time   WBC 9.7 09/29/2017 1105   RBC 4.76 09/29/2017 1105   HGB 14.9 09/29/2017 1105   HGB 15.9 03/27/2017 0944   HCT 45.1 09/29/2017 1105   HCT 47.3 03/27/2017 0944   PLT 195 09/29/2017 1105   PLT 187 03/27/2017 0944   PLT 194 07/08/2016 0944   MCV 94.7 09/29/2017 1105   MCV 93.1 03/27/2017 0944   MCH 31.4 09/29/2017 1105   MCHC 33.1 09/29/2017 1105   RDW 14.4 09/29/2017 1105   RDW 14.6 03/27/2017 0944   LYMPHSABS 1.3 09/29/2017 1105   LYMPHSABS 1.5 03/27/2017 0944   MONOABS 0.9 09/29/2017 1105   MONOABS 0.7 03/27/2017 0944   EOSABS 0.1 09/29/2017 1105   EOSABS 0.1 03/27/2017 0944   EOSABS 0.1 07/08/2016 0944   BASOSABS 0.1 09/29/2017 1105   BASOSABS 0.0 03/27/2017 0944      PFT 01/26/17: FVC 3.52 L (71%) FEV1 1.70 L (46%) FEV1/FVC 0.48 FEF 25-75 0.67 L (23%) positive bronchodilator response TLC 7.15 L (96%) RV 144% ERV 89% DLC corrected 28% 06/15/16: FVC 4.37 L (98%) FEV1 1.71 L (49%) FEV1/FVC 0.40 FEF 25-75 0.39 L (11%) negative bronchodilator response TLC 8.26 L (121%) RV 133% DLCO uncorrected 55%  6MWT 02/02/17:  Walked 435 meters / Baseline Sat 100% on RA / Nadir Sat 96% on RA  IMAGING CT CHEST W/ CONTRAST 03/27/17: IMPRESSION: 1. Interval right upper lobe resection without demonstrated complication. There is a small amount of loculated pleural fluid on the right. 2. No evidence of local recurrence or metastatic disease. No suspicious pulmonary nodules. 3. Aortic Atherosclerosis (ICD10-I70.0) and Emphysema (ICD10-J43.9).  CXR PA/LAT 11/10/16:  Small right pleural effusion with tenting of the diaphragm and volume loss on the right. No parenchymal mass or opacity appreciated. Heart normal in size & mediastinum normal in contour.  PATHOLOGY RUL WEDGE/LOBECTOMY BIOPSY (07/19/16):  Invasive Adenocarcinoma measuring 2.5cm w/ Calcified Pleural Plaque  Lymph Nodes 4R/10R/11R/12R (07/19/16):  Negative for malignancy  LABS 12/22/16 Alpha-1 antitrypsin: MM (197)        Assessment & Plan:   No diagnosis found.  ***   Current Outpatient  Medications:  .  acetaminophen (EQL ARTHRITIS PAIN RELIEF) 650 MG CR tablet, Take 650 mg by mouth every 8 (eight) hours as needed for pain., Disp: , Rfl:  .  albuterol (PROVENTIL HFA;VENTOLIN HFA) 108 (90 Base) MCG/ACT inhaler, Inhale 2 puffs into the lungs every 6 (six) hours as needed for wheezing or shortness of breath., Disp: , Rfl:  .  albuterol (PROVENTIL) (2.5 MG/3ML) 0.083% nebulizer solution, Take 2.5 mg by nebulization every 6 (six) hours as needed for wheezing or shortness of breath., Disp: , Rfl:  .  aspirin EC 81 MG tablet, Take 81 mg by mouth daily., Disp: , Rfl:  .  atorvastatin (LIPITOR) 10 MG  tablet, Take 10 mg by mouth daily., Disp: , Rfl:  .  budesonide (PULMICORT) 0.25 MG/2ML nebulizer solution, Take 2 mLs (0.25 mg total) by nebulization 2 (two) times daily. DX: J44.9, Disp: 60 mL, Rfl: 6 .  Fluticasone-Umeclidin-Vilant (TRELEGY ELLIPTA) 100-62.5-25 MCG/INH AEPB, Inhale 1 puff into the lungs daily., Disp: 1 each, Rfl: 0 .  formoterol (PERFOROMIST) 20 MCG/2ML nebulizer solution, Take 2 mLs (20 mcg total) by nebulization 2 (two) times daily. DX: J44.9, Disp: 60 mL, Rfl: 6 .  levalbuterol (XOPENEX HFA) 45 MCG/ACT inhaler, Inhale 2 puffs into the lungs every 4 (four) hours as needed for shortness of breath., Disp: 1 Inhaler, Rfl: 1 .  lisinopril (PRINIVIL,ZESTRIL) 10 MG tablet, Take 10 mg by mouth daily., Disp: , Rfl:  .  Spacer/Aero Chamber Mouthpiece MISC, 1 Device by Does not apply route 2 (two) times daily., Disp: 1 each, Rfl: 0 .  tamsulosin (FLOMAX) 0.4 MG CAPS capsule, TAKE ONE CAPSULE BY MOUTH DAILY AFTER BREAKFAST, Disp: 30 capsule, Rfl: 0 .  Tiotropium Bromide Monohydrate (SPIRIVA RESPIMAT) 1.25 MCG/ACT AERS, Inhale 2 puffs into the lungs daily., Disp: 2 Inhaler, Rfl: 0 .  traMADol (ULTRAM) 50 MG tablet, Take 1 tablet (50 mg total) by mouth every 12 (twelve) hours as needed for severe pain., Disp: 30 tablet, Rfl: 0

## 2018-01-10 DIAGNOSIS — M5030 Other cervical disc degeneration, unspecified cervical region: Secondary | ICD-10-CM | POA: Diagnosis not present

## 2018-01-11 ENCOUNTER — Ambulatory Visit: Payer: PPO | Admitting: Cardiothoracic Surgery

## 2018-01-11 ENCOUNTER — Telehealth: Payer: Self-pay | Admitting: Pulmonary Disease

## 2018-01-11 VITALS — BP 90/54 | HR 61 | Resp 20 | Ht 72.0 in | Wt 153.0 lb

## 2018-01-11 DIAGNOSIS — Z902 Acquired absence of lung [part of]: Secondary | ICD-10-CM | POA: Diagnosis not present

## 2018-01-11 DIAGNOSIS — C3411 Malignant neoplasm of upper lobe, right bronchus or lung: Secondary | ICD-10-CM

## 2018-01-11 NOTE — Telephone Encounter (Signed)
Received the forms and will place them in [facility]'s holding folder.   BQ, are you willing to fill out the paperwork for him stating that he is 100% disabled?

## 2018-01-11 NOTE — Telephone Encounter (Signed)
Called patient, unable to reach. Left message to give us a call back.  

## 2018-01-11 NOTE — Telephone Encounter (Signed)
He would need an OV to assess for that as we didn't talk about it last time.

## 2018-01-11 NOTE — Progress Notes (Signed)
[address]       [place] [address]             [phone]      Jacy T Scarano [place] Medical Record #[id number] te of Birth: 1950/12/13 Da Referring: [name], MD Primary Care: Gaye Alken, PA-C  Chief Complaint:   POST OP FOLLOW UP  07/19/2016  OPERATIVE REPORT  PREOPERATIVE DIAGNOSIS:  Right upper lobe lung mass. POSTOPERATIVE DIAGNOSIS:  Right upper lobe lung mass. SURGICAL PROCEDURE:  Video bronchoscopy, right video-assisted thoracoscopy, mini thoracotomy, wedge resection of right upper lobe and superior segment of right lower lobe with completion right upper lobectomy, lymph node dissection, and placement of On-Q device. SURGEON:  [name], MD.  08/01/2016 DATE OF DISCHARGE:  OPERATIVE REPORT PREOPERATIVE DIAGNOSIS:  Persistent air leak following right upper lobectomy. POSTOP DIAGNOSIS:  Persistent air leak following right upper lobectomy. SURGICAL PROCEDURE:  Bronchoscopy and placement of Spiration intrabronchial valves x3 in the superior segment of the right lower lobe, in the medial and lateral branches of the right middle lobe. SURGEON:  [name], MD.  08/04/2016 PERATIVE REPORT PREOPERATIVE DIAGNOSIS:  Persistent postoperative air leak. POSTOPERATIVE DIAGNOSIS:  Persistent postoperative air leak. SURGICAL PROCEDURE:  Reoperation with right video-assisted thoracoscopy for persistent air leak with oversewing of the right lower lobe air leak. SURGEON:  [name], MD.  Cancer Staging Adenocarcinoma of right lung, stage 1 [facility]) Staging form: Lung, AJCC 8th Edition - Clinical stage from 09/26/2016: Stage IA2 (cT1b, cN0, cM0) - Signed by Curt Bears, MD on 09/26/2016  Lung cancer [facility]) Staging form: Lung, AJCC 8th Edition - Pathologic stage from 07/22/2016: Stage IA3 (pT1c, pN0, cM0) - Signed by [name], MD on 07/22/2016  History of Present Illness:      Patient returns to the  office today for follow-up after his lung resection January 2018.  Patient has had a follow-up CT scan in March of this year without evidence of recurrent disease.  However he still complains of significant fatigue, dyspnea on exertion and generalized weakness making it difficult to be very active.      Past Medical History:  Diagnosis Date  . Adenocarcinoma of right lung, stage 1 ([place]) 09/26/2016  . Arthritis   . Cancer [facility])    pt states possible lung cancer to right lung  . COPD (chronic obstructive pulmonary disease) ([place])   . Dyspnea    with exertion  . Enlarged prostate   . Head injury    a. remotely with cranial plate in place.  Marland Kitchen Headache    neck cramps   . Lung mass    a. @ [facility] - 2.5 cm right upper lobe hypermetabolic mass highly suggestive of primary lung cancer.   . Pneumonia   . Tobacco abuse      Social History   Tobacco Use  Smoking Status Former Smoker  . Packs/day: 1.00  . Years: 50.00  . Pack years: 50.00  . Types: Cigarettes  . Last attempt to quit: 07/19/2016  . Years since quitting: 1.4  Smokeless Tobacco Never Used    Social History   Substance and Sexual Activity  Alcohol Use No     No Known Allergies  Current Outpatient Medications  Medication Sig Dispense Refill  . acetaminophen (EQL ARTHRITIS PAIN RELIEF) 650 MG CR tablet Take 650 mg by mouth every 8 (eight) hours as needed for pain.    Marland Kitchen albuterol (PROVENTIL HFA;VENTOLIN HFA) 108 (90 Base) MCG/ACT  inhaler Inhale 2 puffs into the lungs every 6 (six) hours as needed for wheezing or shortness of breath.    Marland Kitchen albuterol (PROVENTIL) (2.5 MG/3ML) 0.083% nebulizer solution Take 2.5 mg by nebulization every 6 (six) hours as needed for wheezing or shortness of breath.    Marland Kitchen aspirin EC 81 MG tablet Take 81 mg by mouth daily.    Marland Kitchen atorvastatin (LIPITOR) 10 MG tablet Take 10 mg by mouth daily.    . budesonide (PULMICORT) 0.25 MG/2ML nebulizer solution Take 2 mLs (0.25 mg total) by nebulization 2 (two)  times daily. DX: J44.9 60 mL 6  . Fluticasone-Umeclidin-Vilant (TRELEGY ELLIPTA) 100-62.5-25 MCG/INH AEPB Inhale 1 puff into the lungs daily. 1 each 0  . formoterol (PERFOROMIST) 20 MCG/2ML nebulizer solution Take 2 mLs (20 mcg total) by nebulization 2 (two) times daily. DX: J44.9 60 mL 6  . levalbuterol (XOPENEX HFA) 45 MCG/ACT inhaler Inhale 2 puffs into the lungs every 4 (four) hours as needed for shortness of breath. 1 Inhaler 1  . lisinopril (PRINIVIL,ZESTRIL) 10 MG tablet Take 10 mg by mouth daily.    Marland Kitchen Spacer/Aero Chamber Mouthpiece MISC 1 Device by Does not apply route 2 (two) times daily. 1 each 0  . tamsulosin (FLOMAX) 0.4 MG CAPS capsule TAKE ONE CAPSULE BY MOUTH DAILY AFTER BREAKFAST 30 capsule 0  . Tiotropium Bromide Monohydrate (SPIRIVA RESPIMAT) 1.25 MCG/ACT AERS Inhale 2 puffs into the lungs daily. 2 Inhaler 0  . traMADol (ULTRAM) 50 MG tablet Take 1 tablet (50 mg total) by mouth every 12 (twelve) hours as needed for severe pain. 30 tablet 0   No current facility-administered medications for this visit.        Physical Exam: BP (!) 90/54   Pulse 61   Resp 20   Ht 6' (1.829 m)   Wt 153 lb (69.4 kg)   SpO2 97% Comment: RA  BMI 20.75 kg/m  General appearance: alert, cooperative, appears older than stated age and no distress Head: Normocephalic, without obvious abnormality, atraumatic Neck: no adenopathy, no carotid bruit, no JVD, supple, symmetrical, trachea midline and thyroid not enlarged, symmetric, no tenderness/mass/nodules Lymph nodes: Cervical, supraclavicular, and axillary nodes normal. Resp: clear to auscultation bilaterally Cardio: regular rate and rhythm, S1, S2 normal, no murmur, click, rub or gallop GI: soft, non-tender; bowel sounds normal; no masses,  no organomegaly Extremities: extremities normal, atraumatic, no cyanosis or edema and Homans sign is negative, no sign of DVT Neurologic: Grossly normal Patient has full DP and PT pulses bilaterally. Right  chest incision are well-healed without palpable masses   Diagnostic Studies & Laboratory data:     Recent Radiology Findings:  CLINICAL DATA:  Right lung cancer. Status post right upper lobectomy. Chronic cough and shortness of breath.  EXAM: CT CHEST WITH CONTRAST  TECHNIQUE: Multidetector CT imaging of the chest was performed during intravenous contrast administration.  CONTRAST:  52mL ISOVUE-300 IOPAMIDOL (ISOVUE-300) INJECTION 61%  COMPARISON:  03/27/2017  FINDINGS: Cardiovascular: The heart size is normal. No pericardial effusion. Coronary artery calcification is evident. Atherosclerotic calcification is noted in the wall of the thoracic aorta.  Mediastinum/Nodes: No mediastinal lymphadenopathy. There is no hilar lymphadenopathy. The esophagus has normal imaging features. There is no axillary lymphadenopathy.  Lungs/Pleura: Centrilobular emphysema noted. Surgical changes in the right hilum are compatible with the patient's reported history of right upper lobectomy. No suspicious pulmonary nodule or mass. No focal airspace consolidation. No pulmonary edema pleural effusion.  Upper Abdomen: Unremarkable.  Musculoskeletal: Bone windows reveal  no worrisome lytic or sclerotic osseous lesions.  IMPRESSION: 1. No new or progressive interval findings to suggest recurrent or metastatic disease in the chest. 2. Interval resolution of right pleural effusion. 3.  Emphysema. (ICD10-J43.9) 4.  Aortic Atherosclerois (ICD10-170.0)   Electronically Signed   By: Misty Stanley M.D.   On: 09/29/2017 14:19  I have independently reviewed the above radiology studies  and reviewed the findings with the patient.     Ct Chest W Contrast  Result Date: 03/27/2017 CLINICAL DATA:  Right lung cancer diagnosed in January 2018 status post partial right lung resection. EXAM: CT CHEST WITH CONTRAST TECHNIQUE: Multidetector CT imaging of the chest was performed during  intravenous contrast administration. CONTRAST:  80mL ISOVUE-300 IOPAMIDOL (ISOVUE-300) INJECTION 61% COMPARISON:  Chest radiographs 01/12/2017. PET-CT 06/22/2016. Outside chest CT 06/08/2016. FINDINGS: Cardiovascular: There is atherosclerosis of the aorta, great vessels and coronary arteries. No acute vascular findings are demonstrated. The heart size is normal. There is a small amount of fluid in the superior pericardial recess. Mediastinum/Nodes: There are no enlarged mediastinal, hilar or axillary lymph nodes. The thyroid gland, trachea and esophagus demonstrate no significant findings. Lungs/Pleura: Status post right upper lobectomy. There is a small amount of loculated fluid at the right costophrenic angle. There is no pneumothorax. Moderate centrilobular emphysema is noted. There is mild scarring at the right lung base. No confluent airspace opacity or suspicious pulmonary nodule. Upper abdomen: No acute or suspicious findings are seen within the visualized upper abdomen. Oval low-density anterior to the pancreatic body appears to reflect opacified small bowel as correlated with previous CT. There is no evidence of adrenal mass. Musculoskeletal/Chest wall: There is no chest wall mass or suspicious osseous finding. Right-sided thoracotomy defect noted. IMPRESSION: 1. Interval right upper lobe resection without demonstrated complication. There is a small amount of loculated pleural fluid on the right. 2. No evidence of local recurrence or metastatic disease. No suspicious pulmonary nodules. 3. Aortic Atherosclerosis (ICD10-I70.0) and Emphysema (ICD10-J43.9). Electronically Signed   By: Richardean Sale M.D.   On: 03/27/2017 13:22  Ct Cervical Spine Wo Contrast  Result Date: 10/14/2016 CLINICAL DATA:  Back pain. Lesion at C2 may represent a bone metastasis. EXAM: CT CERVICAL SPINE WITHOUT CONTRAST TECHNIQUE: Multidetector CT imaging of the cervical spine was performed without intravenous contrast. Multiplanar  CT image reconstructions were also generated. COMPARISON:  MRI of the cervical spine 10/10/2016 CT scan 06/22/2016 FINDINGS: Alignment: Slight anterolisthesis at C7-T1 is stable. AP alignment is otherwise anatomic. Skull base and vertebrae: Marked degenerate changes are present at C1-2. The craniocervical junction is otherwise within normal limits. There is a well-defined hypodense lesion on the right at C2 which appears to communicate with the joint space. Inflammatory enhancement was noted in the joint space at C1-2 on the MRI scan. This likely represents a bone erosion or Schmorl's node) a metastatic deposit. No other focal lytic or blastic lesions are present. Soft tissues and spinal canal: Atherosclerotic calcifications are present at the carotid bifurcations bilaterally. There is some heterogeneity of the thyroid without a dominant lesion. Disc levels: Uncovertebral spurring contributes to left greater than right foraminal narrowing at C4-5. There is asymmetric facet hypertrophy on the left as well. Facet spurring contributes to left foraminal narrowing at C5-6. Upper chest: Centrilobular emphysema is noted. IMPRESSION: 1. Well-defined 6 mm hypodense lesion on the right at CT appears communicate with the joint. Given inflammatory changes of the joint on the previous MRI, this likely represents erosion or Schmorl's node. The metastatic  disease is not entirely excluded, but considered much slightly in the absence of other lesions. Recent PET scan does not demonstrate other osseous lesions. 2. Multilevel spondylosis of the cervical spine as described, left greater than right. 3. Centrilobular emphysema. The patient's known lung nodule was not imaged. Electronically Signed   By: San Morelle M.D.   On: 10/14/2016 17:04   Mr Cervical Spine W Wo Contrast  Result Date: 10/10/2016 CLINICAL DATA:  Neck pain and stiffness.  Lung cancer. EXAM: MRI CERVICAL SPINE WITHOUT AND WITH CONTRAST TECHNIQUE: Multiplanar  and multiecho pulse sequences of the cervical spine, to include the craniocervical junction and cervicothoracic junction, were obtained without and with intravenous contrast. CONTRAST:  25mL MULTIHANCE GADOBENATE DIMEGLUMINE 529 MG/ML IV SOLN COMPARISON:  PET CT 06/22/2016 FINDINGS: Alignment: Grade 1 C4-C5 anterolisthesis. Vertebrae: There is a low T1/high T2 weighted signal within the superior aspect of the dens, with associated enhancement on postcontrast images, with a linear inferior margin. The aspect to the right of midline within the C2 body is round with more peripheral contrast enhancement. This is visualized on sagittal images only. There is no other focal marrow signal abnormality or contrast enhancement. Cord: Normal signal and caliber. Posterior Fossa, vertebral arteries, paraspinal tissues: Visualized posterior fossa is normal. Vertebral artery flow voids are preserved. Normal visualized paraspinal soft tissues. Disc levels: C1-C2: Normal. C2-C3: Normal disc space and facets. No spinal canal or neuroforaminal stenosis. C3-C4: Normal disc space and facets. No spinal canal or neuroforaminal stenosis. C4-C5: Left-greater-than-right facet hypertrophy and grade 1 anterolisthesis. No spinal canal stenosis. No nerve root impingement. C5-C6: Left-greater-than-right facet and uncovertebral hypertrophy without spinal canal or neural foraminal stenosis. C6-C7: Normal disc space and facets. No spinal canal or neuroforaminal stenosis. C7-T1: Normal disc space and facets. No spinal canal or neuroforaminal stenosis. IMPRESSION: 1. T2 hyperintense, contrast-enhancing area within the superior aspect of the C2 vertebral body. In the context of a patient with known lung cancer, this is most consistent with a focus of metastatic disease. However, the distribution of the enhancement is somewhat atypical. A superimposed pathologic fracture would be difficult to exclude. CT of the cervical spine without contrast is  recommended for more complete assessment of the vertebral body cortex. 2. No spinal canal or neural foraminal stenosis. 3. Grade 1 anterolisthesis at C4-C5 secondary to facet hypertrophy. Electronically Signed   By: Ulyses Jarred M.D.   On: 10/10/2016 22:22     Recent Lab Findings: Lab Results  Component Value Date   WBC 9.7 09/29/2017   HGB 14.9 09/29/2017   HCT 45.1 09/29/2017   PLT 195 09/29/2017   GLUCOSE 95 09/29/2017   CHOL 243 (H) 07/11/2016   TRIG 102 07/11/2016   HDL 75 07/11/2016   LDLCALC 148 (H) 07/11/2016   ALT 18 09/29/2017   AST 15 09/29/2017   NA 142 09/29/2017   K 4.5 09/29/2017   CL 110 (H) 09/29/2017   CREATININE 0.91 09/29/2017   BUN 9 09/29/2017   CO2 24 09/29/2017   INR 1.04 09/27/2016      Assessment / Plan:   Follow-up for resected stage Ia 3 carcinoma of the lung, without evidence of recurrence on x-ray.   The patient is limited symptomatically with shortness of breath with exertion, currently followed in the pulmonology clinic Plan to see him back in 6 months  [name] MD      [address] [place] [address] Office [phone]   Beeper [phone]  01/11/2018 10:38 AM

## 2018-01-12 NOTE — Telephone Encounter (Signed)
Called and spoke with pt regarding disability forms. Advised pt that BQ is requesting him to have an ROV prior to forms being signed. Pt made appt with BQ for 01/24/2018. At this time the decision can be made by BQ Nothing further needed at this time.

## 2018-01-12 NOTE — Telephone Encounter (Signed)
Attempted to call pt. I did not receive an answer. Call went straight to voicemail. I have left a message for pt to return our call.

## 2018-01-12 NOTE — Telephone Encounter (Signed)
Pt is calling back [phone]

## 2018-01-16 DIAGNOSIS — Z79899 Other long term (current) drug therapy: Secondary | ICD-10-CM | POA: Diagnosis not present

## 2018-01-16 DIAGNOSIS — G894 Chronic pain syndrome: Secondary | ICD-10-CM | POA: Diagnosis not present

## 2018-01-16 DIAGNOSIS — M5030 Other cervical disc degeneration, unspecified cervical region: Secondary | ICD-10-CM | POA: Diagnosis not present

## 2018-01-18 DIAGNOSIS — M542 Cervicalgia: Secondary | ICD-10-CM | POA: Diagnosis not present

## 2018-01-24 ENCOUNTER — Encounter: Payer: Self-pay | Admitting: Pulmonary Disease

## 2018-01-24 ENCOUNTER — Ambulatory Visit: Payer: PPO | Admitting: Pulmonary Disease

## 2018-01-24 VITALS — BP 112/58 | HR 78 | Ht 72.0 in | Wt 153.0 lb

## 2018-01-24 DIAGNOSIS — J449 Chronic obstructive pulmonary disease, unspecified: Secondary | ICD-10-CM

## 2018-01-24 MED ORDER — FLUTICASONE-UMECLIDIN-VILANT 100-62.5-25 MCG/INH IN AEPB: 1.0000 | INHALATION_SPRAY | Freq: Every day | RESPIRATORY_TRACT | 0 refills | Status: DC

## 2018-01-24 MED ORDER — ALBUTEROL SULFATE HFA 108 (90 BASE) MCG/ACT IN AERS: 2.0000 | INHALATION_SPRAY | Freq: Four times a day (QID) | RESPIRATORY_TRACT | 6 refills | Status: DC | PRN

## 2018-01-24 MED ORDER — UMECLIDINIUM BROMIDE 62.5 MCG/INH IN AEPB: 1.0000 | INHALATION_SPRAY | Freq: Every day | RESPIRATORY_TRACT | 0 refills | Status: DC

## 2018-01-24 MED ORDER — UMECLIDINIUM BROMIDE 62.5 MCG/INH IN AEPB: 1.0000 | INHALATION_SPRAY | Freq: Every day | RESPIRATORY_TRACT | 5 refills | Status: DC

## 2018-01-24 NOTE — Patient Instructions (Signed)
Severe COPD with daily symptoms: I will complete your disability paperwork Continue Perforomist twice a day Continue Pulmicort twice a day Add Incruse, 1 puff daily, you need to ask your insurance company if this medicine is covered.  You should ask for the medication formulary and then call us to go over that. Continue to use albuterol every 6 hours Use albuterol as needed for chest tightness wheezing or shortness of breath  We will see you back in 3 to 4 months or sooner if needed

## 2018-01-24 NOTE — Progress Notes (Signed)
Synopsis:Former patient of Dr. Ashok Cordia with COPD, lung cancer and asbestos exposure  Subjective:   PATIENT ID: [name] GENDER: male DOB: 03/12/1951, MRN: [id number]   HPI  Chief Complaint  Patient presents with  . Follow-up    pt has good and bad days. Trelegy works well but is too expensive. Per phone note discussing disability paperwork today.    [name] has really been having a rough time lately.  He continues to complain of shortness of breath fairly regularly.  He says that the Trelegy helped when he took it.  However, he made it clear to me today that he actually takes Brovana and Pulmicort which I did not realize.  He says he uses these as well as albuterol every 6 hours.  Despite this he feels fairly short of breath.  Right now he says he is out of everything.  Is having a hard time because his wife is in the hospital with newly diagnosed metastatic lung cancer.  Past Medical History:  Diagnosis Date  . Adenocarcinoma of right lung, stage 1 ([place]) 09/26/2016  . Arthritis   . Cancer [facility])    pt states possible lung cancer to right lung  . COPD (chronic obstructive pulmonary disease) ([place])   . Dyspnea    with exertion  . Enlarged prostate   . Head injury    a. remotely with cranial plate in place.  Marland Kitchen Headache    neck cramps   . Lung mass    a. @ [facility] - 2.5 cm right upper lobe hypermetabolic mass highly suggestive of primary lung cancer.   . Pneumonia   . Tobacco abuse       Review of Systems  Constitutional: Negative for diaphoresis, fever, malaise/fatigue and weight loss.  HENT: Positive for congestion. Negative for nosebleeds and sore throat.   Respiratory: Positive for cough. Negative for sputum production, shortness of breath and wheezing.   Cardiovascular: Negative for chest pain and leg swelling.      Objective:  Physical Exam   Vitals:   01/24/18 1056  BP: (!) 112/58  Pulse: 78  SpO2: 96%  Weight: 153 lb (69.4 kg)  Height: 6' (1.829 m)     Gen: well appearing HENT: OP clear, TM's clear, neck supple PULM: CTA B, normal percussion CV: RRR, no mgr, trace edema GI: BS+, soft, nontender Derm: no cyanosis or rash Psyche: normal mood and affect   CBC    Component Value Date/Time   WBC 9.7 09/29/2017 1105   RBC 4.76 09/29/2017 1105   HGB 14.9 09/29/2017 1105   HGB 15.9 03/27/2017 0944   HCT 45.1 09/29/2017 1105   HCT 47.3 03/27/2017 0944   PLT 195 09/29/2017 1105   PLT 187 03/27/2017 0944   PLT 194 07/08/2016 0944   MCV 94.7 09/29/2017 1105   MCV 93.1 03/27/2017 0944   MCH 31.4 09/29/2017 1105   MCHC 33.1 09/29/2017 1105   RDW 14.4 09/29/2017 1105   RDW 14.6 03/27/2017 0944   LYMPHSABS 1.3 09/29/2017 1105   LYMPHSABS 1.5 03/27/2017 0944   MONOABS 0.9 09/29/2017 1105   MONOABS 0.7 03/27/2017 0944   EOSABS 0.1 09/29/2017 1105   EOSABS 0.1 03/27/2017 0944   EOSABS 0.1 07/08/2016 0944   BASOSABS 0.1 09/29/2017 1105   BASOSABS 0.0 03/27/2017 0944     PFT 01/26/17: FVC 3.52 L (71%) FEV1 1.70 L (46%) FEV1/FVC 0.48 FEF 25-75 0.67 L (23%) positive bronchodilator response TLC 7.15 L (96%) RV 144% ERV 89%  DLC corrected 28% 06/15/16: FVC 4.37 L (98%) FEV1 1.71 L (49%) FEV1/FVC 0.40 FEF 25-75 0.39 L (11%) negative bronchodilator response TLC 8.26 L (121%) RV 133% DLCO uncorrected 55%  6MWT 02/02/17:  Walked 435 meters / Baseline Sat 100% on RA / Nadir Sat 96% on RA  IMAGING CT CHEST W/ CONTRAST 03/27/17: IMPRESSION: 1. Interval right upper lobe resection without demonstrated complication. There is a small amount of loculated pleural fluid on the right. 2. No evidence of local recurrence or metastatic disease. No suspicious pulmonary nodules. 3. Aortic Atherosclerosis (ICD10-I70.0) and Emphysema (ICD10-J43.9).  CXR PA/LAT 11/10/16:  Small right pleural effusion with tenting of the diaphragm and volume loss on the right. No parenchymal mass or opacity appreciated. Heart normal in size & mediastinum normal in  contour.  PATHOLOGY RUL WEDGE/LOBECTOMY BIOPSY (07/19/16):  Invasive Adenocarcinoma measuring 2.5cm w/ Calcified Pleural Plaque  Lymph Nodes 4R/10R/11R/12R (07/19/16):  Negative for malignancy  LABS 12/22/16 Alpha-1 antitrypsin: MM (197)   Records from his visit with Dr. Servando Snare on July 2019 reviewed     Assessment & Plan:   COPD, severe ([place])  Discussion: [name] has struggled with his COPD lately.  He is currently not on maximal therapy so I am going to have him continue taking Perforomist, Pulmicort and we will add Incruse.  He will continue using albuterol nebulizer throughout the day.  We will refill an albuterol rescue inhaler.  Plan: Severe COPD with daily symptoms: I will complete your disability paperwork Continue Perforomist twice a day Continue Pulmicort twice a day Add Incruse, 1 puff daily, you need to ask your insurance company if this medicine is covered.  You should ask for the medication formulary and then call us to go over that. Continue to use albuterol every 6 hours Use albuterol as needed for chest tightness wheezing or shortness of breath  Non-small cell lung cancer: no evidence of recurrence per recent visit with thoracic surgery  We will see you back in 3 to 4 months or sooner if needed  Greater than 50% of this 25-minute visit spent face to face   Current Outpatient Medications:  .  acetaminophen (EQL ARTHRITIS PAIN RELIEF) 650 MG CR tablet, Take 650 mg by mouth every 8 (eight) hours as needed for pain., Disp: , Rfl:  .  albuterol (PROVENTIL HFA;VENTOLIN HFA) 108 (90 Base) MCG/ACT inhaler, Inhale 2 puffs into the lungs every 6 (six) hours as needed for wheezing or shortness of breath., Disp: , Rfl:  .  albuterol (PROVENTIL) (2.5 MG/3ML) 0.083% nebulizer solution, Take 2.5 mg by nebulization every 6 (six) hours as needed for wheezing or shortness of breath., Disp: , Rfl:  .  aspirin EC 81 MG tablet, Take 81 mg by mouth daily., Disp: , Rfl:  .   atorvastatin (LIPITOR) 10 MG tablet, Take 10 mg by mouth daily., Disp: , Rfl:  .  budesonide (PULMICORT) 0.25 MG/2ML nebulizer solution, Take 2 mLs (0.25 mg total) by nebulization 2 (two) times daily. DX: J44.9, Disp: 60 mL, Rfl: 6 .  formoterol (PERFOROMIST) 20 MCG/2ML nebulizer solution, Take 2 mLs (20 mcg total) by nebulization 2 (two) times daily. DX: J44.9, Disp: 60 mL, Rfl: 6 .  levalbuterol (XOPENEX HFA) 45 MCG/ACT inhaler, Inhale 2 puffs into the lungs every 4 (four) hours as needed for shortness of breath., Disp: 1 Inhaler, Rfl: 1 .  lisinopril (PRINIVIL,ZESTRIL) 10 MG tablet, Take 10 mg by mouth daily., Disp: , Rfl:  .  Spacer/Aero Chamber Mouthpiece MISC, 1 Device by  Does not apply route 2 (two) times daily., Disp: 1 each, Rfl: 0 .  tamsulosin (FLOMAX) 0.4 MG CAPS capsule, TAKE ONE CAPSULE BY MOUTH DAILY AFTER BREAKFAST, Disp: 30 capsule, Rfl: 0 .  traMADol (ULTRAM) 50 MG tablet, Take 1 tablet (50 mg total) by mouth every 12 (twelve) hours as needed for severe pain., Disp: 30 tablet, Rfl: 0 .  albuterol (PROVENTIL HFA;VENTOLIN HFA) 108 (90 Base) MCG/ACT inhaler, Inhale 2 puffs into the lungs every 6 (six) hours as needed for wheezing or shortness of breath., Disp: 1 Inhaler, Rfl: 6 .  umeclidinium bromide (INCRUSE ELLIPTA) 62.5 MCG/INH AEPB, Inhale 1 puff into the lungs daily., Disp: 2 each, Rfl: 0 .  umeclidinium bromide (INCRUSE ELLIPTA) 62.5 MCG/INH AEPB, Inhale 1 puff into the lungs daily., Disp: 30 each, Rfl: 5

## 2018-02-05 DIAGNOSIS — M542 Cervicalgia: Secondary | ICD-10-CM | POA: Diagnosis not present

## 2018-02-09 DIAGNOSIS — J029 Acute pharyngitis, unspecified: Secondary | ICD-10-CM | POA: Diagnosis not present

## 2018-02-09 DIAGNOSIS — J209 Acute bronchitis, unspecified: Secondary | ICD-10-CM | POA: Diagnosis not present

## 2018-02-09 DIAGNOSIS — Z760 Encounter for issue of repeat prescription: Secondary | ICD-10-CM | POA: Diagnosis not present

## 2018-02-09 DIAGNOSIS — R05 Cough: Secondary | ICD-10-CM | POA: Diagnosis not present

## 2018-02-14 DIAGNOSIS — F329 Major depressive disorder, single episode, unspecified: Secondary | ICD-10-CM | POA: Diagnosis not present

## 2018-02-14 DIAGNOSIS — J069 Acute upper respiratory infection, unspecified: Secondary | ICD-10-CM | POA: Diagnosis not present

## 2018-02-14 DIAGNOSIS — E782 Mixed hyperlipidemia: Secondary | ICD-10-CM | POA: Diagnosis not present

## 2018-02-14 DIAGNOSIS — F17200 Nicotine dependence, unspecified, uncomplicated: Secondary | ICD-10-CM | POA: Diagnosis not present

## 2018-02-16 DIAGNOSIS — Z79899 Other long term (current) drug therapy: Secondary | ICD-10-CM | POA: Diagnosis not present

## 2018-02-16 DIAGNOSIS — I10 Essential (primary) hypertension: Secondary | ICD-10-CM | POA: Diagnosis not present

## 2018-02-16 DIAGNOSIS — M5030 Other cervical disc degeneration, unspecified cervical region: Secondary | ICD-10-CM | POA: Diagnosis not present

## 2018-02-16 DIAGNOSIS — G894 Chronic pain syndrome: Secondary | ICD-10-CM | POA: Diagnosis not present

## 2018-04-02 ENCOUNTER — Ambulatory Visit (HOSPITAL_COMMUNITY)
Admission: RE | Admit: 2018-04-02 | Discharge: 2018-04-02 | Disposition: A | Discharge status: Home / Self Care | Payer: PPO | Source: Ambulatory Visit | Attending: Internal Medicine | Admitting: Internal Medicine

## 2018-04-02 ENCOUNTER — Encounter (HOSPITAL_COMMUNITY): Payer: Self-pay | Admitting: Radiology

## 2018-04-02 ENCOUNTER — Inpatient Hospital Stay: Payer: PPO | Attending: Internal Medicine

## 2018-04-02 DIAGNOSIS — I700 Atherosclerosis of aorta: Secondary | ICD-10-CM | POA: Insufficient documentation

## 2018-04-02 DIAGNOSIS — C3491 Malignant neoplasm of unspecified part of right bronchus or lung: Secondary | ICD-10-CM | POA: Diagnosis not present

## 2018-04-02 DIAGNOSIS — C3411 Malignant neoplasm of upper lobe, right bronchus or lung: Secondary | ICD-10-CM | POA: Insufficient documentation

## 2018-04-02 DIAGNOSIS — C3490 Malignant neoplasm of unspecified part of unspecified bronchus or lung: Secondary | ICD-10-CM

## 2018-04-02 LAB — CBC WITH DIFFERENTIAL (CANCER CENTER ONLY)
BASOS PCT: 1 %
Basophils Absolute: 0.1 10*3/uL (ref 0.0–0.1)
EOS ABS: 0.1 10*3/uL (ref 0.0–0.5)
EOS PCT: 1 %
HCT: 43.2 % (ref 38.4–49.9)
HEMOGLOBIN: 14.1 g/dL (ref 13.0–17.1)
LYMPHS ABS: 1.5 10*3/uL (ref 0.9–3.3)
LYMPHS PCT: 23 %
MCH: 30.8 pg (ref 27.2–33.4)
MCHC: 32.8 g/dL (ref 32.0–36.0)
MCV: 94 fL (ref 79.3–98.0)
Monocytes Absolute: 0.5 10*3/uL (ref 0.1–0.9)
Monocytes Relative: 8 %
NEUTROS ABS: 4.3 10*3/uL (ref 1.5–6.5)
NEUTROS PCT: 67 %
PLATELETS: 146 10*3/uL (ref 140–400)
RBC: 4.59 MIL/uL (ref 4.20–5.82)
RDW: 15.2 % — ABNORMAL HIGH (ref 11.0–14.6)
WBC: 6.4 10*3/uL (ref 4.0–10.3)

## 2018-04-02 LAB — CMP (CANCER CENTER ONLY)
ALBUMIN: 3.6 g/dL (ref 3.5–5.0)
ALT: 10 U/L (ref 0–44)
ANION GAP: 6 (ref 5–15)
AST: 13 U/L — ABNORMAL LOW (ref 15–41)
Alkaline Phosphatase: 91 U/L (ref 38–126)
BUN: 9 mg/dL (ref 8–23)
CHLORIDE: 105 mmol/L (ref 98–111)
CO2: 29 mmol/L (ref 22–32)
Calcium: 9.1 mg/dL (ref 8.9–10.3)
Creatinine: 0.81 mg/dL (ref 0.61–1.24)
GFR, Estimated: >60 mL/min (ref >60)
GLUCOSE: 89 mg/dL (ref 70–99)
Potassium: 4 mmol/L (ref 3.5–5.1)
Sodium: 140 mmol/L (ref 135–145)
Total Bilirubin: 0.4 mg/dL (ref 0.3–1.2)
Total Protein: 6.4 g/dL — ABNORMAL LOW (ref 6.5–8.1)

## 2018-04-02 MED ORDER — SODIUM CHLORIDE 0.9 % IJ SOLN
INTRAMUSCULAR | Status: AC
  Filled 2018-04-02: qty 50

## 2018-04-02 MED ORDER — IOHEXOL 300 MG/ML  SOLN: 100.0000 mL | Freq: Once | INTRAMUSCULAR | Status: DC | PRN

## 2018-04-02 MED ORDER — IOPAMIDOL (ISOVUE-300) INJECTION 61%
100.0000 mL | Freq: Once | INTRAVENOUS | Status: AC | PRN
  Administered 2018-04-02: 100 mL via INTRAVENOUS

## 2018-04-02 MED ORDER — IOPAMIDOL (ISOVUE-300) INJECTION 61%
INTRAVENOUS | Status: AC
  Filled 2018-04-02: qty 100

## 2018-04-05 ENCOUNTER — Encounter: Payer: Self-pay | Admitting: Internal Medicine

## 2018-04-05 ENCOUNTER — Telehealth: Payer: Self-pay | Admitting: Internal Medicine

## 2018-04-05 ENCOUNTER — Inpatient Hospital Stay: Payer: PPO | Attending: Internal Medicine | Admitting: Internal Medicine

## 2018-04-05 VITALS — BP 116/63 | HR 56 | Temp 97.7°F | Resp 20 | Ht 72.0 in | Wt 150.1 lb

## 2018-04-05 DIAGNOSIS — Z7982 Long term (current) use of aspirin: Secondary | ICD-10-CM

## 2018-04-05 DIAGNOSIS — C3411 Malignant neoplasm of upper lobe, right bronchus or lung: Secondary | ICD-10-CM

## 2018-04-05 DIAGNOSIS — C3490 Malignant neoplasm of unspecified part of unspecified bronchus or lung: Secondary | ICD-10-CM

## 2018-04-05 DIAGNOSIS — Z79899 Other long term (current) drug therapy: Secondary | ICD-10-CM | POA: Diagnosis not present

## 2018-04-05 DIAGNOSIS — Z720 Tobacco use: Secondary | ICD-10-CM | POA: Insufficient documentation

## 2018-04-05 DIAGNOSIS — Z902 Acquired absence of lung [part of]: Secondary | ICD-10-CM | POA: Insufficient documentation

## 2018-04-05 DIAGNOSIS — R5383 Other fatigue: Secondary | ICD-10-CM

## 2018-04-05 DIAGNOSIS — I700 Atherosclerosis of aorta: Secondary | ICD-10-CM | POA: Diagnosis not present

## 2018-04-05 DIAGNOSIS — C3491 Malignant neoplasm of unspecified part of right bronchus or lung: Secondary | ICD-10-CM | POA: Diagnosis not present

## 2018-04-05 NOTE — Progress Notes (Signed)
[facility] [phone]   [phone]  OFFICE PROGRESS NOTE  [place], [place] [address]  DIAGNOSIS: Stage IA (T1b, N0, M0) non-small cell lung cancer, well-differentiated adenocarcinoma presented with right lower lobe lung nodule diagnosed in January 2018.  PRIOR THERAPY: Video bronchoscopy in addition to right upper lobectomy and wedge resection of the superior segment of the right lower lobe with lymph node dissection under the care of Dr. Servando Snare on 07/19/2016.  CURRENT THERAPY: Observation.  INTERVAL HISTORY: [name] 67 y.o. male returns to the clinic today for six-month follow-up visit.  The patient is feeling fine today with no concerning complaints except for mild fatigue.  Unfortunately he lost his wife to small cell lung cancer in August 2019.  He denied having any chest pain, shortness of breath, cough or hemoptysis.  He denied having any fever or chills.  He has no nausea, vomiting, diarrhea or constipation.  He was supposed to have repeat CT scan of the chest before this visit but unfortunately by mistake he had CT scan of the abdomen.  He is here today for evaluation and recommendation regarding his condition.  MEDICAL HISTORY: Past Medical History:  Diagnosis Date  . Adenocarcinoma of right lung, stage 1 ([place]) 09/26/2016  . Arthritis   . Cancer [facility])    pt states possible lung cancer to right lung  . COPD (chronic obstructive pulmonary disease) ([place])   . Dyspnea    with exertion  . Enlarged prostate   . Head injury    a. remotely with cranial plate in place.  Marland Kitchen Headache    neck cramps   . Lung mass    a. @ [facility] - 2.5 cm right upper lobe hypermetabolic mass highly suggestive of primary lung cancer.   . Pneumonia   . Tobacco abuse     ALLERGIES:  has No Known Allergies.  MEDICATIONS:  Current Outpatient Medications  Medication Sig Dispense Refill  . acetaminophen (EQL ARTHRITIS  PAIN RELIEF) 650 MG CR tablet Take 650 mg by mouth every 8 (eight) hours as needed for pain.    Marland Kitchen albuterol (PROVENTIL HFA;VENTOLIN HFA) 108 (90 Base) MCG/ACT inhaler Inhale 2 puffs into the lungs every 6 (six) hours as needed for wheezing or shortness of breath.    Marland Kitchen albuterol (PROVENTIL HFA;VENTOLIN HFA) 108 (90 Base) MCG/ACT inhaler Inhale 2 puffs into the lungs every 6 (six) hours as needed for wheezing or shortness of breath. 1 Inhaler 6  . albuterol (PROVENTIL) (2.5 MG/3ML) 0.083% nebulizer solution Take 2.5 mg by nebulization every 6 (six) hours as needed for wheezing or shortness of breath.    Marland Kitchen aspirin EC 81 MG tablet Take 81 mg by mouth daily.    Marland Kitchen atorvastatin (LIPITOR) 10 MG tablet Take 10 mg by mouth daily.    . budesonide (PULMICORT) 0.25 MG/2ML nebulizer solution Take 2 mLs (0.25 mg total) by nebulization 2 (two) times daily. DX: J44.9 60 mL 6  . formoterol (PERFOROMIST) 20 MCG/2ML nebulizer solution Take 2 mLs (20 mcg total) by nebulization 2 (two) times daily. DX: J44.9 60 mL 6  . levalbuterol (XOPENEX HFA) 45 MCG/ACT inhaler Inhale 2 puffs into the lungs every 4 (four) hours as needed for shortness of breath. 1 Inhaler 1  . lisinopril (PRINIVIL,ZESTRIL) 10 MG tablet Take 10 mg by mouth daily.    Marland Kitchen Spacer/Aero Chamber Mouthpiece MISC 1 Device by Does not apply route 2 (two) times  daily. 1 each 0  . tamsulosin (FLOMAX) 0.4 MG CAPS capsule TAKE ONE CAPSULE BY MOUTH DAILY AFTER BREAKFAST 30 capsule 0  . traMADol (ULTRAM) 50 MG tablet Take 1 tablet (50 mg total) by mouth every 12 (twelve) hours as needed for severe pain. 30 tablet 0  . umeclidinium bromide (INCRUSE ELLIPTA) 62.5 MCG/INH AEPB Inhale 1 puff into the lungs daily. 2 each 0  . umeclidinium bromide (INCRUSE ELLIPTA) 62.5 MCG/INH AEPB Inhale 1 puff into the lungs daily. 30 each 5   No current facility-administered medications for this visit.     SURGICAL HISTORY:  Past Surgical History:  Procedure Laterality Date  .  CARPAL TUNNEL RELEASE Right   . COLONOSCOPY    . ELBOW SURGERY     surgery x2 on right elbow  . EYE SURGERY     eye socket fracture, metal plate on left side of face  . HAND SURGERY     multiple hand surgeries on right hand  . LOBECTOMY Right 07/19/2016   Procedure: LOBECTOMY RIGHT UPPER LOBE WITH WEDGE RESECTION SUPERIOR SEGMENT RIGHT LOWER LOBE WITH PLACEMENT OF ON-Q;  Surgeon: [name], MD;  Location: [facility];  Service: Thoracic;  Laterality: Right;  . LYMPH NODE DISSECTION Right 07/19/2016   Procedure: LYMPH NODE DISSECTION;  Surgeon: [name], MD;  Location: [facility];  Service: Thoracic;  Laterality: Right;  . SKIN GRAFT     to right hand   . VIDEO ASSISTED THORACOSCOPY Right 08/04/2016   Procedure: RIGHT VIDEO ASSISTED THORACOSCOPY WITH SUTURE CLOSURE OF AIR LEAK;  Surgeon: [name], MD;  Location: [facility];  Service: Thoracic;  Laterality: Right;  Marland Kitchen VIDEO ASSISTED THORACOSCOPY (VATS)/WEDGE RESECTION Right 07/19/2016   Procedure: VIDEO ASSISTED THORACOSCOPY (VATS);  Surgeon: [name], MD;  Location: [facility];  Service: Thoracic;  Laterality: Right;  Marland Kitchen VIDEO BRONCHOSCOPY N/A 07/19/2016   Procedure: VIDEO BRONCHOSCOPY;  Surgeon: [name], MD;  Location: [place];  Service: Thoracic;  Laterality: N/A;  . VIDEO BRONCHOSCOPY N/A 09/28/2016   Procedure: VIDEO BRONCHOSCOPY, with general anesthesia for removal of interbronchial valves x 3;  Surgeon: [name], MD;  Location: [facility];  Service: Thoracic;  Laterality: N/A;  . VIDEO BRONCHOSCOPY WITH INSERTION OF INTERBRONCHIAL VALVE (IBV) Right 08/01/2016   Procedure: VIDEO BRONCHOSCOPY WITH INSERTION OF INTERBRONCHIAL VALVE (IBV);  Surgeon: [name], MD;  Location: [place] [facility];  Service: Thoracic;  Laterality: Right;  . WEDGE RESECTION Right 07/19/2016   Procedure: WEDGE  RESECTION RIGHT UPPER LOBE;  Surgeon: [name], MD;  Location: [facility];  Service: Thoracic;  Laterality: Right;    REVIEW OF SYSTEMS:   A comprehensive review of systems was negative.   PHYSICAL EXAMINATION: General appearance: alert, cooperative and no distress Head: Normocephalic, without obvious abnormality, atraumatic Neck: no adenopathy, no JVD, supple, symmetrical, trachea midline and thyroid not enlarged, symmetric, no tenderness/mass/nodules Lymph nodes: Cervical, supraclavicular, and axillary nodes normal. Resp: clear to auscultation bilaterally Back: symmetric, no curvature. ROM normal. No CVA tenderness. Cardio: regular rate and rhythm, S1, S2 normal, no murmur, click, rub or gallop GI: soft, non-tender; bowel sounds normal; no masses,  no organomegaly Extremities: extremities normal, atraumatic, no cyanosis or edema  ECOG PERFORMANCE STATUS: 1 - Symptomatic but completely ambulatory  Blood pressure 116/63, pulse (!) 56, temperature 97.7 F (36.5 C), temperature source Oral, resp. rate 20, height 6' (1.829 m), weight 150 lb 1.6 oz (68.1 kg), SpO2 96 %.  LABORATORY DATA:  Lab Results  Component Value Date   WBC 6.4 04/02/2018   HGB 14.1 04/02/2018   HCT 43.2 04/02/2018   MCV 94.0 04/02/2018   PLT 146 04/02/2018      Chemistry      Component Value Date/Time   NA 140 04/02/2018 1226   NA 137 03/27/2017 0944   K 4.0 04/02/2018 1226   K 4.3 03/27/2017 0944   CL 105 04/02/2018 1226   CO2 29 04/02/2018 1226   CO2 25 03/27/2017 0944   BUN 9 04/02/2018 1226   BUN 8.3 03/27/2017 0944   CREATININE 0.81 04/02/2018 1226   CREATININE 0.9 03/27/2017 0944      Component Value Date/Time   CALCIUM 9.1 04/02/2018 1226   CALCIUM 9.7 03/27/2017 0944   ALKPHOS 91 04/02/2018 1226   ALKPHOS 103 03/27/2017 0944   AST 13 (L) 04/02/2018 1226   AST 13 03/27/2017 0944   ALT 10 04/02/2018 1226   ALT <6 03/27/2017 0944   BILITOT 0.4 04/02/2018 1226   BILITOT 0.53 03/27/2017 0944       RADIOGRAPHIC STUDIES: Ct Abdomen W Contrast  Result Date: 04/02/2018 CLINICAL DATA:  Right lung cancer.  Follow-up EXAM: CT  ABDOMEN WITH CONTRAST TECHNIQUE: Multidetector CT imaging of the abdomen was performed using the standard protocol following bolus administration of intravenous contrast. CONTRAST:  <See Chart> OMNIPAQUE IOHEXOL 300 MG/ML SOLN, 177mL ISOVUE-300 IOPAMIDOL (ISOVUE-300) INJECTION 61% COMPARISON:  06/22/2016 FINDINGS: Lower chest: No acute abnormality. Hepatobiliary: Small focal area of low attenuation within caudate lobe measures 5 mm, unchanged. Likely small cysts. No additional focal liver abnormalities. No gallstones, gallbladder wall thickening, or biliary dilatation. Pancreas: Unremarkable. No pancreatic ductal dilatation or surrounding inflammatory changes. Spleen: Normal in size without focal abnormality. Adrenals/Urinary Tract: Adrenal glands are unremarkable. Kidneys are normal, without renal calculi, focal lesion, or hydronephrosis. Bladder is unremarkable. Stomach/Bowel: Stomach is within normal limits. No evidence of bowel wall thickening, distention, or inflammatory changes. Vascular/Lymphatic: Aortic atherosclerosis without aneurysm. No adenopathy. Other: No abdominal wall hernia or abnormality. No abdominopelvic ascites. Musculoskeletal: No acute or significant osseous findings. IMPRESSION: No mass or adenopathy identified within the abdomen. Aortic Atherosclerosis (ICD10-I70.0). Electronically Signed   By: Kerby Moors M.D.   On: 04/02/2018 14:45    ASSESSMENT AND PLAN: this is a very pleasant 67 years old white male with history of stage IA non-small cell lung cancer status post right upper lobectomy with lymph node dissection in January 2018. Patient has been in observation since that time.  He is feeling fine with no concerning complaints.  CT of the abdomen performed earlier showed no concerning findings in the abdomen. I recommended for the patient to have repeat CT scan of the chest in the next few days and if there is no evidence for disease recurrence, I would see him back for follow-up  visit in 6 months with repeat blood work and CT scan of the chest. He was advised to call immediately if he has any concerning symptoms in the interval. The patient voices understanding of current disease status and treatment options and is in agreement with the current care plan. All questions were answered. The patient knows to call the clinic with any problems, questions or concerns. We can certainly see the patient much sooner if necessary.  I spent 10 minutes counseling the patient face to face. The total time spent in the appointment was 15 minutes.  Disclaimer: This note was dictated with voice recognition software. Similar sounding words can inadvertently  be transcribed and may not be corrected upon review.

## 2018-04-05 NOTE — Telephone Encounter (Signed)
Gave pt avs and calendar  °

## 2018-04-06 ENCOUNTER — Telehealth: Payer: Self-pay | Admitting: *Deleted

## 2018-04-06 NOTE — Telephone Encounter (Signed)
"  [name] Centralized radiology Scheduling calling for orders for CT scan.  Patient says he is to have CT chest w/contrast however order is without.  Need new orders for CT chest with contrast."

## 2018-04-09 ENCOUNTER — Other Ambulatory Visit: Payer: Self-pay | Admitting: *Deleted

## 2018-04-12 ENCOUNTER — Ambulatory Visit (HOSPITAL_COMMUNITY)
Admission: RE | Admit: 2018-04-12 | Discharge: 2018-04-12 | Disposition: A | Discharge status: Home / Self Care | Payer: PPO | Source: Ambulatory Visit | Attending: Internal Medicine | Admitting: Internal Medicine

## 2018-04-12 DIAGNOSIS — R918 Other nonspecific abnormal finding of lung field: Secondary | ICD-10-CM | POA: Insufficient documentation

## 2018-04-12 DIAGNOSIS — J439 Emphysema, unspecified: Secondary | ICD-10-CM | POA: Insufficient documentation

## 2018-04-12 DIAGNOSIS — C3490 Malignant neoplasm of unspecified part of unspecified bronchus or lung: Secondary | ICD-10-CM | POA: Diagnosis not present

## 2018-04-12 DIAGNOSIS — C3491 Malignant neoplasm of unspecified part of right bronchus or lung: Secondary | ICD-10-CM | POA: Diagnosis not present

## 2018-04-14 DIAGNOSIS — E782 Mixed hyperlipidemia: Secondary | ICD-10-CM | POA: Diagnosis not present

## 2018-04-14 DIAGNOSIS — N401 Enlarged prostate with lower urinary tract symptoms: Secondary | ICD-10-CM | POA: Diagnosis not present

## 2018-04-14 DIAGNOSIS — Z125 Encounter for screening for malignant neoplasm of prostate: Secondary | ICD-10-CM | POA: Diagnosis not present

## 2018-04-14 DIAGNOSIS — I10 Essential (primary) hypertension: Secondary | ICD-10-CM | POA: Diagnosis not present

## 2018-04-14 DIAGNOSIS — N138 Other obstructive and reflux uropathy: Secondary | ICD-10-CM | POA: Diagnosis not present

## 2018-05-24 ENCOUNTER — Encounter: Payer: Self-pay | Admitting: Pulmonary Disease

## 2018-05-24 ENCOUNTER — Ambulatory Visit: Payer: PPO | Admitting: Pulmonary Disease

## 2018-05-24 VITALS — BP 104/60 | HR 80 | Wt 152.8 lb

## 2018-05-24 DIAGNOSIS — J449 Chronic obstructive pulmonary disease, unspecified: Secondary | ICD-10-CM | POA: Diagnosis not present

## 2018-05-24 DIAGNOSIS — R911 Solitary pulmonary nodule: Secondary | ICD-10-CM

## 2018-05-24 DIAGNOSIS — C3491 Malignant neoplasm of unspecified part of right bronchus or lung: Secondary | ICD-10-CM | POA: Diagnosis not present

## 2018-05-24 MED ORDER — REVEFENACIN 175 MCG/3ML IN SOLN: 3.0000 mL | Freq: Every day | RESPIRATORY_TRACT | 0 refills | Status: DC

## 2018-05-24 MED ORDER — BUDESONIDE 180 MCG/ACT IN AEPB: 2.0000 | INHALATION_SPRAY | Freq: Two times a day (BID) | RESPIRATORY_TRACT | 0 refills | Status: DC

## 2018-05-24 MED ORDER — FORMOTEROL FUMARATE 20 MCG/2ML IN NEBU: 20.0000 ug | INHALATION_SOLUTION | Freq: Two times a day (BID) | RESPIRATORY_TRACT | 0 refills | Status: DC

## 2018-05-24 NOTE — Progress Notes (Signed)
Synopsis:Former patient of Dr. Ashok Cordia with COPD, lung cancer and asbestos exposure Quit smoking January 2018  Subjective:   PATIENT ID: [name] GENDER: male DOB: [birth year], MRN: [id number]   HPI  Chief Complaint  Patient presents with  . Follow-up    increased shortness of breath, wheezing at night   [name] has been struggling since the last visit.  He has not had bronchitis or pneumonia and he has been staying active with walking every day.  So while his lungs have not given him much trouble he is struggled financially.  His wife died of lung cancer, and he is continues to struggle to pay off medical bills for both her and him.  Because of this he has not been taking his medications regularly because he cannot afford them.  He also has questions for me today regarding a CT scan of his chest which was performed a month ago after his visit with the oncology clinic.  Past Medical History:  Diagnosis Date  . Adenocarcinoma of right lung, stage 1 ([place]) 09/26/2016  . Arthritis   . Cancer [facility])    pt states possible lung cancer to right lung  . COPD (chronic obstructive pulmonary disease) ([place])   . Dyspnea    with exertion  . Enlarged prostate   . Head injury    a. remotely with cranial plate in place.  Marland Kitchen Headache    neck cramps   . Lung mass    a. @ [facility] - 2.5 cm right upper lobe hypermetabolic mass highly suggestive of primary lung cancer.   . Pneumonia   . Tobacco abuse       Review of Systems  Constitutional: Negative for diaphoresis, fever, malaise/fatigue and weight loss.  HENT: Positive for congestion. Negative for nosebleeds and sore throat.   Respiratory: Positive for cough. Negative for sputum production, shortness of breath and wheezing.   Cardiovascular: Negative for chest pain and leg swelling.      Objective:  Physical Exam   Vitals:   05/24/18 0854  BP: 104/60  Pulse: 80  SpO2: 95%  Weight: 152 lb 12.8 oz (69.3 kg)    Gen: chronically ill  appearing HENT: OP clear, TM's clear, neck supple PULM: CTA B, normal percussion CV: RRR, no mgr, trace edema GI: BS+, soft, nontender Derm: no cyanosis or rash Psyche: normal mood and affect   CBC    Component Value Date/Time   WBC 6.4 04/02/2018 1226   WBC 9.7 09/29/2017 1105   RBC 4.59 04/02/2018 1226   HGB 14.1 04/02/2018 1226   HGB 15.9 03/27/2017 0944   HCT 43.2 04/02/2018 1226   HCT 47.3 03/27/2017 0944   PLT 146 04/02/2018 1226   PLT 187 03/27/2017 0944   PLT 194 07/08/2016 0944   MCV 94.0 04/02/2018 1226   MCV 93.1 03/27/2017 0944   MCH 30.8 04/02/2018 1226   MCHC 32.8 04/02/2018 1226   RDW 15.2 (H) 04/02/2018 1226   RDW 14.6 03/27/2017 0944   LYMPHSABS 1.5 04/02/2018 1226   LYMPHSABS 1.5 03/27/2017 0944   MONOABS 0.5 04/02/2018 1226   MONOABS 0.7 03/27/2017 0944   EOSABS 0.1 04/02/2018 1226   EOSABS 0.1 03/27/2017 0944   EOSABS 0.1 07/08/2016 0944   BASOSABS 0.1 04/02/2018 1226   BASOSABS 0.0 03/27/2017 0944     PFT 01/26/17: FVC 3.52 L (71%) FEV1 1.70 L (46%) FEV1/FVC 0.48 FEF 25-75 0.67 L (23%) positive bronchodilator response TLC 7.15 L (96%) RV 144% ERV 89%  DLC corrected 28% 06/15/16: FVC 4.37 L (98%) FEV1 1.71 L (49%) FEV1/FVC 0.40 FEF 25-75 0.39 L (11%) negative bronchodilator response TLC 8.26 L (121%) RV 133% DLCO uncorrected 55%  6MWT 02/02/17:  Walked 435 meters / Baseline Sat 100% on RA / Nadir Sat 96% on RA  IMAGING CT chest October 2019: 2 new pulmonary nodule seen in the left lower lobe, the largest is 8 mm, centrilobular emphysema noted, images personally reviewed.  Right upper lobectomy changes. CT CHEST W/ CONTRAST 03/27/17: IMPRESSION: 1. Interval right upper lobe resection without demonstrated complication. There is a small amount of loculated pleural fluid on the right. 2. No evidence of local recurrence or metastatic disease. No suspicious pulmonary nodules. 3. Aortic Atherosclerosis (ICD10-I70.0) and Emphysema  (ICD10-J43.9).  CXR PA/LAT 11/10/16:  Small right pleural effusion with tenting of the diaphragm and volume loss on the right. No parenchymal mass or opacity appreciated. Heart normal in size & mediastinum normal in contour.  PATHOLOGY RUL WEDGE/LOBECTOMY BIOPSY (07/19/16):  Invasive Adenocarcinoma measuring 2.5cm w/ Calcified Pleural Plaque  Lymph Nodes 4R/10R/11R/12R (07/19/16):  Negative for malignancy  LABS 12/22/16 Alpha-1 antitrypsin: MM (197)   Records from his visit with Dr. Earlie Server reviewed from 04/2018 showing no evidence of disease recurrence but he had a CT chest ordered for surveillance for recurrent lung cancer.     Assessment & Plan:   COPD, severe ([place])  Pulmonary nodule  Non-small cell cancer of right lung ([facility])  Discussion: Though he has not had an exacerbation of his COPD he has struggled to pay for his medications and there has been some mild dyspnea associated with this.  Specifically when he ran out of his [place] he started to have a bit more shortness of breath.  Despite severe stress in his life he is remaining physically active and intentionally walking on a daily basis for exercise.  Plan: Pulmonary nodules: There were 2 pulmonary nodule seen on the CT scan of your chest in October.  I believe the next best step is to get another CT scan in April, however I will discuss this with Dr. Earlie Server.  Centrilobular emphysema with severe COPD: Continue Perforomist Continue budesonide In the lieu of [place], I will give you samples of you pill re-to use on a daily basis Use albuterol as needed for chest tightness wheezing or shortness of breath Practice good hand hygiene If you change your mind about the flu shot let us know  We will see you back in April 2020 or sooner if needed   Current Outpatient Medications:  .  acetaminophen (EQL ARTHRITIS PAIN RELIEF) 650 MG CR tablet, Take 650 mg by mouth every 8 (eight) hours as needed for pain., Disp: , Rfl:  .   albuterol (PROVENTIL) (2.5 MG/3ML) 0.083% nebulizer solution, Take 2.5 mg by nebulization every 6 (six) hours as needed for wheezing or shortness of breath., Disp: , Rfl:  .  aspirin EC 81 MG tablet, Take 81 mg by mouth daily., Disp: , Rfl:  .  budesonide (PULMICORT) 0.25 MG/2ML nebulizer solution, Take 2 mLs (0.25 mg total) by nebulization 2 (two) times daily. DX: J44.9, Disp: 60 mL, Rfl: 6 .  formoterol (PERFOROMIST) 20 MCG/2ML nebulizer solution, Take 2 mLs (20 mcg total) by nebulization 2 (two) times daily. DX: J44.9, Disp: 200 mL, Rfl: 0 .  tamsulosin (FLOMAX) 0.4 MG CAPS capsule, TAKE ONE CAPSULE BY MOUTH DAILY AFTER BREAKFAST, Disp: 30 capsule, Rfl: 0 .  traMADol (ULTRAM) 50 MG tablet, Take 1 tablet (50  mg total) by mouth every 12 (twelve) hours as needed for severe pain., Disp: 30 tablet, Rfl: 0 .  albuterol (PROVENTIL HFA;VENTOLIN HFA) 108 (90 Base) MCG/ACT inhaler, Inhale 2 puffs into the lungs every 6 (six) hours as needed for wheezing or shortness of breath. (Patient not taking: Reported on 05/24/2018), Disp: 1 Inhaler, Rfl: 6 .  atorvastatin (LIPITOR) 10 MG tablet, Take 10 mg by mouth daily., Disp: , Rfl:  .  budesonide (PULMICORT FLEXHALER) 180 MCG/ACT inhaler, Inhale 2 puffs into the lungs 2 (two) times daily., Disp: 2 Inhaler, Rfl: 0 .  levalbuterol (XOPENEX HFA) 45 MCG/ACT inhaler, Inhale 2 puffs into the lungs every 4 (four) hours as needed for shortness of breath. (Patient not taking: Reported on 05/24/2018), Disp: 1 Inhaler, Rfl: 1 .  lisinopril (PRINIVIL,ZESTRIL) 10 MG tablet, Take 10 mg by mouth daily., Disp: , Rfl:  .  Revefenacin (YUPELRI) 175 MCG/3ML SOLN, Inhale 3 mLs into the lungs daily., Disp: 90 mL, Rfl: 0 .  Spacer/Aero Chamber Mouthpiece MISC, 1 Device by Does not apply route 2 (two) times daily., Disp: 1 each, Rfl: 0 .  umeclidinium bromide (INCRUSE ELLIPTA) 62.5 MCG/INH AEPB, Inhale 1 puff into the lungs daily. (Patient not taking: Reported on 05/24/2018), Disp: 30  each, Rfl: 5

## 2018-05-24 NOTE — Patient Instructions (Signed)
Pulmonary nodules: There were 2 pulmonary nodule seen on the CT scan of your chest in October.  I believe the next best step is to get another CT scan in April, however I will discuss this with Dr. Earlie Server.  Centrilobular emphysema with severe COPD: Continue Perforomist Continue budesonide In the lieu of [place], I will give you samples of you pill re-to use on a daily basis Use albuterol as needed for chest tightness wheezing or shortness of breath Practice good hand hygiene If you change your mind about the flu shot let us know  We will see you back in April 2020 or sooner if needed

## 2018-05-24 NOTE — Progress Notes (Signed)
thanks

## 2018-06-19 DIAGNOSIS — E782 Mixed hyperlipidemia: Secondary | ICD-10-CM | POA: Diagnosis not present

## 2018-06-19 DIAGNOSIS — M5030 Other cervical disc degeneration, unspecified cervical region: Secondary | ICD-10-CM | POA: Diagnosis not present

## 2018-06-19 DIAGNOSIS — D229 Melanocytic nevi, unspecified: Secondary | ICD-10-CM | POA: Diagnosis not present

## 2018-06-19 DIAGNOSIS — I10 Essential (primary) hypertension: Secondary | ICD-10-CM | POA: Diagnosis not present

## 2018-06-19 DIAGNOSIS — Z79899 Other long term (current) drug therapy: Secondary | ICD-10-CM | POA: Diagnosis not present

## 2018-07-19 ENCOUNTER — Other Ambulatory Visit: Payer: Self-pay

## 2018-07-19 ENCOUNTER — Ambulatory Visit: Payer: PPO | Admitting: Cardiothoracic Surgery

## 2018-07-20 NOTE — Progress Notes (Unsigned)
This encounter was created in error - please disregard.  This encounter was created in error - please disregard.

## 2018-07-22 DIAGNOSIS — I10 Essential (primary) hypertension: Secondary | ICD-10-CM | POA: Diagnosis not present

## 2018-07-22 DIAGNOSIS — G8929 Other chronic pain: Secondary | ICD-10-CM | POA: Diagnosis not present

## 2018-07-22 DIAGNOSIS — Z79899 Other long term (current) drug therapy: Secondary | ICD-10-CM | POA: Diagnosis not present

## 2018-07-22 DIAGNOSIS — M542 Cervicalgia: Secondary | ICD-10-CM | POA: Diagnosis not present

## 2018-07-26 ENCOUNTER — Other Ambulatory Visit: Payer: Self-pay | Admitting: Internal Medicine

## 2018-07-26 DIAGNOSIS — C3490 Malignant neoplasm of unspecified part of unspecified bronchus or lung: Secondary | ICD-10-CM

## 2018-08-06 DIAGNOSIS — D489 Neoplasm of uncertain behavior, unspecified: Secondary | ICD-10-CM | POA: Diagnosis not present

## 2018-08-06 DIAGNOSIS — L821 Other seborrheic keratosis: Secondary | ICD-10-CM | POA: Diagnosis not present

## 2018-08-06 DIAGNOSIS — Z85118 Personal history of other malignant neoplasm of bronchus and lung: Secondary | ICD-10-CM | POA: Diagnosis not present

## 2018-08-06 DIAGNOSIS — R238 Other skin changes: Secondary | ICD-10-CM | POA: Diagnosis not present

## 2018-08-09 DIAGNOSIS — D099 Carcinoma in situ, unspecified: Secondary | ICD-10-CM | POA: Diagnosis not present

## 2018-08-22 DIAGNOSIS — I10 Essential (primary) hypertension: Secondary | ICD-10-CM | POA: Diagnosis not present

## 2018-08-22 DIAGNOSIS — E782 Mixed hyperlipidemia: Secondary | ICD-10-CM | POA: Diagnosis not present

## 2018-08-22 DIAGNOSIS — G8929 Other chronic pain: Secondary | ICD-10-CM | POA: Diagnosis not present

## 2018-08-22 DIAGNOSIS — Z79899 Other long term (current) drug therapy: Secondary | ICD-10-CM | POA: Diagnosis not present

## 2018-08-22 DIAGNOSIS — M542 Cervicalgia: Secondary | ICD-10-CM | POA: Diagnosis not present

## 2018-08-23 ENCOUNTER — Encounter: Payer: Self-pay | Admitting: Cardiothoracic Surgery

## 2018-08-23 ENCOUNTER — Ambulatory Visit: Payer: PPO | Admitting: Cardiothoracic Surgery

## 2018-08-23 ENCOUNTER — Other Ambulatory Visit: Payer: Self-pay

## 2018-08-23 VITALS — BP 124/76 | HR 57 | Resp 18 | Ht 72.0 in | Wt 161.4 lb

## 2018-08-23 DIAGNOSIS — C3491 Malignant neoplasm of unspecified part of right bronchus or lung: Secondary | ICD-10-CM | POA: Diagnosis not present

## 2018-08-23 DIAGNOSIS — C3411 Malignant neoplasm of upper lobe, right bronchus or lung: Secondary | ICD-10-CM

## 2018-08-23 NOTE — Progress Notes (Signed)
[address]       [place] [address]             [phone]      [facility] [place] Medical Record #[id number] te of Birth: 1950-11-06 Da Referring: [name], MD Primary Care: [facility], [place]  Chief Complaint:   POST OP FOLLOW UP  07/19/2016  OPERATIVE REPORT  PREOPERATIVE DIAGNOSIS:  Right upper lobe lung mass. POSTOPERATIVE DIAGNOSIS:  Right upper lobe lung mass. SURGICAL PROCEDURE:  Video bronchoscopy, right video-assisted thoracoscopy, mini thoracotomy, wedge resection of right upper lobe and superior segment of right lower lobe with completion right upper lobectomy, lymph node dissection, and placement of On-Q device. SURGEON:  [name], MD.  08/01/2016 DATE OF DISCHARGE:  OPERATIVE REPORT PREOPERATIVE DIAGNOSIS:  Persistent air leak following right upper lobectomy. POSTOP DIAGNOSIS:  Persistent air leak following right upper lobectomy. SURGICAL PROCEDURE:  Bronchoscopy and placement of Spiration intrabronchial valves x3 in the superior segment of the right lower lobe, in the medial and lateral branches of the right middle lobe. SURGEON:  [name], MD.  08/04/2016 PERATIVE REPORT PREOPERATIVE DIAGNOSIS:  Persistent postoperative air leak. POSTOPERATIVE DIAGNOSIS:  Persistent postoperative air leak. SURGICAL PROCEDURE:  Reoperation with right video-assisted thoracoscopy for persistent air leak with oversewing of the right lower lobe air leak. SURGEON:  [name], MD.  Cancer Staging Adenocarcinoma of right lung, stage 1 [facility]) Staging form: Lung, AJCC 8th Edition - Clinical stage from 09/26/2016: Stage IA2 (cT1b, cN0, cM0) - Signed by Curt Bears, MD on 09/26/2016  Lung cancer [facility]) Staging form: Lung, AJCC 8th Edition - Pathologic stage from 07/22/2016: Stage IA3 (pT1c, pN0, cM0) - Signed by [name], MD on 07/22/2016  History of Present Illness:      Patient returns to the  office today for follow-up after his lung resection January 2018.  Since last seen he notes that his wife was diagnosed with advanced stage small cell carcinoma and declined rapidly and died.   In addition he notes that in October he went for follow-up CT scan of the chest .  At the cancer center, a CT with contrast was done of the abdomen the patient says that he told the x-ray techs that he was to have a CT of the chest, they did a CT of the abdomen anyway.  He is concerned that he is been charged for this and has had to pay for it.   Subsequently a CT of the chest was done in October, he has a follow-up scan arranged for April.     Past Medical History:  Diagnosis Date  . Adenocarcinoma of right lung, stage 1 ([place]) 09/26/2016  . Arthritis   . Cancer [facility])    pt states possible lung cancer to right lung  . COPD (chronic obstructive pulmonary disease) ([place])   . Dyspnea    with exertion  . Enlarged prostate   . Head injury    a. remotely with cranial plate in place.  Marland Kitchen Headache    neck cramps   . Lung mass    a. @ [facility] - 2.5 cm right upper lobe hypermetabolic mass highly suggestive of primary lung cancer.   . Pneumonia   . Tobacco abuse      Social History   Tobacco Use  Smoking Status Former Smoker  . Packs/day: 1.00  . Years: 50.00  . Pack years: 50.00  . Types: Cigarettes  . Last attempt  to quit: 07/19/2016  . Years since quitting: 2.0  Smokeless Tobacco Never Used    Social History   Substance and Sexual Activity  Alcohol Use No     No Known Allergies  Current Outpatient Medications  Medication Sig Dispense Refill  . acetaminophen (EQL ARTHRITIS PAIN RELIEF) 650 MG CR tablet Take 650 mg by mouth every 8 (eight) hours as needed for pain.    Marland Kitchen albuterol (PROVENTIL HFA;VENTOLIN HFA) 108 (90 Base) MCG/ACT inhaler Inhale 2 puffs into the lungs every 6 (six) hours as needed for wheezing or shortness of breath. 1 Inhaler 6  . albuterol (PROVENTIL) (2.5 MG/3ML) 0.083%  nebulizer solution Take 2.5 mg by nebulization every 6 (six) hours as needed for wheezing or shortness of breath.    Marland Kitchen aspirin EC 81 MG tablet Take 81 mg by mouth daily.    Marland Kitchen atorvastatin (LIPITOR) 10 MG tablet Take 10 mg by mouth daily.    . budesonide (PULMICORT FLEXHALER) 180 MCG/ACT inhaler Inhale 2 puffs into the lungs 2 (two) times daily. 2 Inhaler 0  . budesonide (PULMICORT) 0.25 MG/2ML nebulizer solution Take 2 mLs (0.25 mg total) by nebulization 2 (two) times daily. DX: J44.9 60 mL 6  . formoterol (PERFOROMIST) 20 MCG/2ML nebulizer solution Take 2 mLs (20 mcg total) by nebulization 2 (two) times daily. DX: J44.9 200 mL 0  . levalbuterol (XOPENEX HFA) 45 MCG/ACT inhaler Inhale 2 puffs into the lungs every 4 (four) hours as needed for shortness of breath. 1 Inhaler 1  . lisinopril (PRINIVIL,ZESTRIL) 10 MG tablet Take 10 mg by mouth daily.    . Revefenacin (YUPELRI) 175 MCG/3ML SOLN Inhale 3 mLs into the lungs daily. 90 mL 0  . Spacer/Aero Chamber Mouthpiece MISC 1 Device by Does not apply route 2 (two) times daily. 1 each 0  . tamsulosin (FLOMAX) 0.4 MG CAPS capsule TAKE ONE CAPSULE BY MOUTH DAILY AFTER BREAKFAST 30 capsule 0  . traMADol (ULTRAM) 50 MG tablet Take 1 tablet (50 mg total) by mouth every 12 (twelve) hours as needed for severe pain. 30 tablet 0  . umeclidinium bromide (INCRUSE ELLIPTA) 62.5 MCG/INH AEPB Inhale 1 puff into the lungs daily. 30 each 5   No current facility-administered medications for this visit.        Physical Exam: BP 124/76 (BP Location: Left Arm, Patient Position: Sitting, Cuff Size: Large)   Pulse (!) 57   Resp 18   Ht 6' (1.829 m)   Wt 161 lb 6.4 oz (73.2 kg)   SpO2 95% Comment: RA  BMI 21.89 kg/m  General appearance: alert and cooperative Head: Normocephalic, without obvious abnormality, atraumatic Neck: no adenopathy, no carotid bruit, no JVD, supple, symmetrical, trachea midline and thyroid not enlarged, symmetric, no  tenderness/mass/nodules Lymph nodes: Cervical, supraclavicular, and axillary nodes normal. Resp: clear to auscultation bilaterally Back: symmetric, no curvature. ROM normal. No CVA tenderness. Cardio: regular rate and rhythm, S1, S2 normal, no murmur, click, rub or gallop GI: soft, non-tender; bowel sounds normal; no masses,  no organomegaly Extremities: extremities normal, atraumatic, no cyanosis or edema and Homans sign is negative, no sign of DVT Neurologic: Grossly normal   Diagnostic Studies & Laboratory data:     Recent Radiology Findings:  CLINICAL DATA:  Non-small-cell lung cancer of the right lung. Status post right upper lobectomy and wedge resection superior segment right lower lobe.  EXAM: CT CHEST WITHOUT CONTRAST  TECHNIQUE: Multidetector CT imaging of the chest was performed following the standard protocol without IV  contrast.  COMPARISON:  09/29/2017  FINDINGS: Cardiovascular: The heart size is normal. No substantial pericardial effusion. Coronary artery calcification is evident. Atherosclerotic calcification is noted in the wall of the thoracic aorta.  Mediastinum/Nodes: No mediastinal lymphadenopathy. No evidence for gross hilar lymphadenopathy although assessment is limited by the lack of intravenous contrast on today's study. The esophagus has normal imaging features. There is no axillary lymphadenopathy.  Lungs/Pleura: The central tracheobronchial airways are patent. Lungs are hyperexpanded with changes of centrilobular and paraseptal emphysema noted. Status post right upper lobectomy. No suspicious pulmonary nodule or mass in the right lung. Left lung demonstrates a new 6 mm lower lobe nodule (88/7). 8 mm nodule in the posterior left lower lobe (126/7) has a suggestion of branching pattern and may represent impaction of distal bronchiectatic airways. No pleural effusion. No pulmonary edema.  Upper Abdomen: Unremarkable  Musculoskeletal: No  worrisome lytic or sclerotic osseous abnormality.  IMPRESSION: 1. Interval development of 2 tiny nodules in the left lower lobe. These are likely infectious/inflammatory but close attention on follow-up recommended. 2. Otherwise stable exam. 3.  Aortic Atherosclerois (ICD10-170.0) 4.  Emphysema. (GXQ11-H41.9)   Electronically Signed   By: Misty Stanley M.D.   On: 04/13/2018 07:59  CLINICAL DATA:  Right lung cancer. Status post right upper lobectomy. Chronic cough and shortness of breath.  EXAM: CT CHEST WITH CONTRAST  TECHNIQUE: Multidetector CT imaging of the chest was performed during intravenous contrast administration.  CONTRAST:  49mL ISOVUE-300 IOPAMIDOL (ISOVUE-300) INJECTION 61%  COMPARISON:  03/27/2017  FINDINGS: Cardiovascular: The heart size is normal. No pericardial effusion. Coronary artery calcification is evident. Atherosclerotic calcification is noted in the wall of the thoracic aorta.  Mediastinum/Nodes: No mediastinal lymphadenopathy. There is no hilar lymphadenopathy. The esophagus has normal imaging features. There is no axillary lymphadenopathy.  Lungs/Pleura: Centrilobular emphysema noted. Surgical changes in the right hilum are compatible with the patient's reported history of right upper lobectomy. No suspicious pulmonary nodule or mass. No focal airspace consolidation. No pulmonary edema pleural effusion.  Upper Abdomen: Unremarkable.  Musculoskeletal: Bone windows reveal no worrisome lytic or sclerotic osseous lesions.  IMPRESSION: 1. No new or progressive interval findings to suggest recurrent or metastatic disease in the chest. 2. Interval resolution of right pleural effusion. 3.  Emphysema. (ICD10-J43.9) 4.  Aortic Atherosclerois (ICD10-170.0)   Electronically Signed   By: Misty Stanley M.D.   On: 09/29/2017 14:19  I have independently reviewed the above radiology studies  and reviewed the findings with the  patient.     Ct Chest W Contrast  Result Date: 03/27/2017 CLINICAL DATA:  Right lung cancer diagnosed in January 2018 status post partial right lung resection. EXAM: CT CHEST WITH CONTRAST TECHNIQUE: Multidetector CT imaging of the chest was performed during intravenous contrast administration. CONTRAST:  46mL ISOVUE-300 IOPAMIDOL (ISOVUE-300) INJECTION 61% COMPARISON:  Chest radiographs 01/12/2017. PET-CT 06/22/2016. Outside chest CT 06/08/2016. FINDINGS: Cardiovascular: There is atherosclerosis of the aorta, great vessels and coronary arteries. No acute vascular findings are demonstrated. The heart size is normal. There is a small amount of fluid in the superior pericardial recess. Mediastinum/Nodes: There are no enlarged mediastinal, hilar or axillary lymph nodes. The thyroid gland, trachea and esophagus demonstrate no significant findings. Lungs/Pleura: Status post right upper lobectomy. There is a small amount of loculated fluid at the right costophrenic angle. There is no pneumothorax. Moderate centrilobular emphysema is noted. There is mild scarring at the right lung base. No confluent airspace opacity or suspicious pulmonary nodule. Upper abdomen: No  acute or suspicious findings are seen within the visualized upper abdomen. Oval low-density anterior to the pancreatic body appears to reflect opacified small bowel as correlated with previous CT. There is no evidence of adrenal mass. Musculoskeletal/Chest wall: There is no chest wall mass or suspicious osseous finding. Right-sided thoracotomy defect noted. IMPRESSION: 1. Interval right upper lobe resection without demonstrated complication. There is a small amount of loculated pleural fluid on the right. 2. No evidence of local recurrence or metastatic disease. No suspicious pulmonary nodules. 3. Aortic Atherosclerosis (ICD10-I70.0) and Emphysema (ICD10-J43.9). Electronically Signed   By: Richardean Sale M.D.   On: 03/27/2017 13:22  Ct Cervical Spine Wo  Contrast  Result Date: 10/14/2016 CLINICAL DATA:  Back pain. Lesion at C2 may represent a bone metastasis. EXAM: CT CERVICAL SPINE WITHOUT CONTRAST TECHNIQUE: Multidetector CT imaging of the cervical spine was performed without intravenous contrast. Multiplanar CT image reconstructions were also generated. COMPARISON:  MRI of the cervical spine 10/10/2016 CT scan 06/22/2016 FINDINGS: Alignment: Slight anterolisthesis at C7-T1 is stable. AP alignment is otherwise anatomic. Skull base and vertebrae: Marked degenerate changes are present at C1-2. The craniocervical junction is otherwise within normal limits. There is a well-defined hypodense lesion on the right at C2 which appears to communicate with the joint space. Inflammatory enhancement was noted in the joint space at C1-2 on the MRI scan. This likely represents a bone erosion or Schmorl's node) a metastatic deposit. No other focal lytic or blastic lesions are present. Soft tissues and spinal canal: Atherosclerotic calcifications are present at the carotid bifurcations bilaterally. There is some heterogeneity of the thyroid without a dominant lesion. Disc levels: Uncovertebral spurring contributes to left greater than right foraminal narrowing at C4-5. There is asymmetric facet hypertrophy on the left as well. Facet spurring contributes to left foraminal narrowing at C5-6. Upper chest: Centrilobular emphysema is noted. IMPRESSION: 1. Well-defined 6 mm hypodense lesion on the right at CT appears communicate with the joint. Given inflammatory changes of the joint on the previous MRI, this likely represents erosion or Schmorl's node. The metastatic disease is not entirely excluded, but considered much slightly in the absence of other lesions. Recent PET scan does not demonstrate other osseous lesions. 2. Multilevel spondylosis of the cervical spine as described, left greater than right. 3. Centrilobular emphysema. The patient's known lung nodule was not imaged.  Electronically Signed   By: San Morelle M.D.   On: 10/14/2016 17:04   Mr Cervical Spine W Wo Contrast  Result Date: 10/10/2016 CLINICAL DATA:  Neck pain and stiffness.  Lung cancer. EXAM: MRI CERVICAL SPINE WITHOUT AND WITH CONTRAST TECHNIQUE: Multiplanar and multiecho pulse sequences of the cervical spine, to include the craniocervical junction and cervicothoracic junction, were obtained without and with intravenous contrast. CONTRAST:  23mL MULTIHANCE GADOBENATE DIMEGLUMINE 529 MG/ML IV SOLN COMPARISON:  PET CT 06/22/2016 FINDINGS: Alignment: Grade 1 C4-C5 anterolisthesis. Vertebrae: There is a low T1/high T2 weighted signal within the superior aspect of the dens, with associated enhancement on postcontrast images, with a linear inferior margin. The aspect to the right of midline within the C2 body is round with more peripheral contrast enhancement. This is visualized on sagittal images only. There is no other focal marrow signal abnormality or contrast enhancement. Cord: Normal signal and caliber. Posterior Fossa, vertebral arteries, paraspinal tissues: Visualized posterior fossa is normal. Vertebral artery flow voids are preserved. Normal visualized paraspinal soft tissues. Disc levels: C1-C2: Normal. C2-C3: Normal disc space and facets. No spinal canal or neuroforaminal stenosis.  C3-C4: Normal disc space and facets. No spinal canal or neuroforaminal stenosis. C4-C5: Left-greater-than-right facet hypertrophy and grade 1 anterolisthesis. No spinal canal stenosis. No nerve root impingement. C5-C6: Left-greater-than-right facet and uncovertebral hypertrophy without spinal canal or neural foraminal stenosis. C6-C7: Normal disc space and facets. No spinal canal or neuroforaminal stenosis. C7-T1: Normal disc space and facets. No spinal canal or neuroforaminal stenosis. IMPRESSION: 1. T2 hyperintense, contrast-enhancing area within the superior aspect of the C2 vertebral body. In the context of a patient  with known lung cancer, this is most consistent with a focus of metastatic disease. However, the distribution of the enhancement is somewhat atypical. A superimposed pathologic fracture would be difficult to exclude. CT of the cervical spine without contrast is recommended for more complete assessment of the vertebral body cortex. 2. No spinal canal or neural foraminal stenosis. 3. Grade 1 anterolisthesis at C4-C5 secondary to facet hypertrophy. Electronically Signed   By: Ulyses Jarred M.D.   On: 10/10/2016 22:22     Recent Lab Findings: Lab Results  Component Value Date   WBC 6.4 04/02/2018   HGB 14.1 04/02/2018   HCT 43.2 04/02/2018   PLT 146 04/02/2018   GLUCOSE 89 04/02/2018   CHOL 243 (H) 07/11/2016   TRIG 102 07/11/2016   HDL 75 07/11/2016   LDLCALC 148 (H) 07/11/2016   ALT 10 04/02/2018   AST 13 (L) 04/02/2018   NA 140 04/02/2018   K 4.0 04/02/2018   CL 105 04/02/2018   CREATININE 0.81 04/02/2018   BUN 9 04/02/2018   CO2 29 04/02/2018   INR 1.04 09/27/2016      Assessment / Plan:   #1 follow-up for resected stage Ia 3 carcinoma of the right upper lobe without evidence of recurrence on CT scan-small left lower lobe lung nodules are appreciated and are being followed on CT, next follow-up CT is scheduled for April 3 #2 patient continues to be symptomatic with shortness of breath with exertion and is followed in the pulmonary clinic. #3 recent death of his wife from rapidly progressing small cell carcinoma of the lung.  Plan to see him back in 6 months  [name] MD      [address] [place] [address] Office [phone]   Beeper [phone]  08/23/2018 9:19 AM

## 2018-09-14 DIAGNOSIS — D099 Carcinoma in situ, unspecified: Secondary | ICD-10-CM | POA: Diagnosis not present

## 2018-09-14 DIAGNOSIS — C4491 Basal cell carcinoma of skin, unspecified: Secondary | ICD-10-CM | POA: Diagnosis not present

## 2018-09-19 DIAGNOSIS — G8929 Other chronic pain: Secondary | ICD-10-CM | POA: Diagnosis not present

## 2018-09-19 DIAGNOSIS — F329 Major depressive disorder, single episode, unspecified: Secondary | ICD-10-CM | POA: Diagnosis not present

## 2018-09-19 DIAGNOSIS — Z79899 Other long term (current) drug therapy: Secondary | ICD-10-CM | POA: Diagnosis not present

## 2018-09-19 DIAGNOSIS — I10 Essential (primary) hypertension: Secondary | ICD-10-CM | POA: Diagnosis not present

## 2018-09-19 DIAGNOSIS — M542 Cervicalgia: Secondary | ICD-10-CM | POA: Diagnosis not present

## 2018-10-05 ENCOUNTER — Inpatient Hospital Stay: Payer: PPO | Attending: Internal Medicine

## 2018-10-05 ENCOUNTER — Other Ambulatory Visit: Payer: Self-pay

## 2018-10-05 ENCOUNTER — Ambulatory Visit (HOSPITAL_COMMUNITY)
Admission: RE | Admit: 2018-10-05 | Discharge: 2018-10-05 | Disposition: A | Discharge status: Home / Self Care | Payer: PPO | Source: Ambulatory Visit | Attending: Internal Medicine | Admitting: Internal Medicine

## 2018-10-05 ENCOUNTER — Encounter (HOSPITAL_COMMUNITY): Payer: Self-pay

## 2018-10-05 DIAGNOSIS — C3431 Malignant neoplasm of lower lobe, right bronchus or lung: Secondary | ICD-10-CM | POA: Insufficient documentation

## 2018-10-05 DIAGNOSIS — Z7982 Long term (current) use of aspirin: Secondary | ICD-10-CM | POA: Insufficient documentation

## 2018-10-05 DIAGNOSIS — M129 Arthropathy, unspecified: Secondary | ICD-10-CM | POA: Diagnosis not present

## 2018-10-05 DIAGNOSIS — R0602 Shortness of breath: Secondary | ICD-10-CM | POA: Diagnosis not present

## 2018-10-05 DIAGNOSIS — Z96642 Presence of left artificial hip joint: Secondary | ICD-10-CM | POA: Diagnosis not present

## 2018-10-05 DIAGNOSIS — J449 Chronic obstructive pulmonary disease, unspecified: Secondary | ICD-10-CM | POA: Diagnosis not present

## 2018-10-05 DIAGNOSIS — R51 Headache: Secondary | ICD-10-CM | POA: Diagnosis not present

## 2018-10-05 DIAGNOSIS — F17210 Nicotine dependence, cigarettes, uncomplicated: Secondary | ICD-10-CM | POA: Diagnosis not present

## 2018-10-05 DIAGNOSIS — N400 Enlarged prostate without lower urinary tract symptoms: Secondary | ICD-10-CM | POA: Insufficient documentation

## 2018-10-05 DIAGNOSIS — Z8701 Personal history of pneumonia (recurrent): Secondary | ICD-10-CM | POA: Diagnosis not present

## 2018-10-05 DIAGNOSIS — I700 Atherosclerosis of aorta: Secondary | ICD-10-CM | POA: Insufficient documentation

## 2018-10-05 DIAGNOSIS — Z79899 Other long term (current) drug therapy: Secondary | ICD-10-CM | POA: Diagnosis not present

## 2018-10-05 DIAGNOSIS — C3490 Malignant neoplasm of unspecified part of unspecified bronchus or lung: Secondary | ICD-10-CM | POA: Insufficient documentation

## 2018-10-05 DIAGNOSIS — Z471 Aftercare following joint replacement surgery: Secondary | ICD-10-CM | POA: Diagnosis not present

## 2018-10-05 DIAGNOSIS — M170 Bilateral primary osteoarthritis of knee: Secondary | ICD-10-CM | POA: Diagnosis not present

## 2018-10-05 LAB — CBC WITH DIFFERENTIAL (CANCER CENTER ONLY)
Abs Immature Granulocytes: 0.03 10*3/uL (ref 0.00–0.07)
Basophils Absolute: 0.1 10*3/uL (ref 0.0–0.1)
Basophils Relative: 1 %
Eosinophils Absolute: 0.1 10*3/uL (ref 0.0–0.5)
Eosinophils Relative: 2 %
HCT: 48.1 % (ref 39.0–52.0)
Hemoglobin: 15.4 g/dL (ref 13.0–17.0)
Immature Granulocytes: 1 %
Lymphocytes Relative: 27 %
Lymphs Abs: 1.7 10*3/uL (ref 0.7–4.0)
MCH: 31.4 pg (ref 26.0–34.0)
MCHC: 32 g/dL (ref 30.0–36.0)
MCV: 98 fL (ref 80.0–100.0)
Monocytes Absolute: 0.7 10*3/uL (ref 0.1–1.0)
Monocytes Relative: 10 %
Neutro Abs: 3.9 10*3/uL (ref 1.7–7.7)
Neutrophils Relative %: 59 %
Platelet Count: 181 10*3/uL (ref 150–400)
RBC: 4.91 MIL/uL (ref 4.22–5.81)
RDW: 13.7 % (ref 11.5–15.5)
WBC Count: 6.5 10*3/uL (ref 4.0–10.5)
nRBC: 0 % (ref 0.0–0.2)

## 2018-10-05 LAB — CMP (CANCER CENTER ONLY)
ALT: 11 U/L (ref 0–44)
AST: 15 U/L (ref 15–41)
Albumin: 3.9 g/dL (ref 3.5–5.0)
Alkaline Phosphatase: 91 U/L (ref 38–126)
Anion gap: 10 (ref 5–15)
BUN: 8 mg/dL (ref 8–23)
CO2: 26 mmol/L (ref 22–32)
Calcium: 9.3 mg/dL (ref 8.9–10.3)
Chloride: 105 mmol/L (ref 98–111)
Creatinine: 0.89 mg/dL (ref 0.61–1.24)
GFR, Est AFR Am: >60 mL/min (ref >60)
GFR, Estimated: >60 mL/min (ref >60)
Glucose, Bld: 105 mg/dL — ABNORMAL HIGH (ref 70–99)
Potassium: 4.1 mmol/L (ref 3.5–5.1)
Sodium: 141 mmol/L (ref 135–145)
Total Bilirubin: 0.3 mg/dL (ref 0.3–1.2)
Total Protein: 6.8 g/dL (ref 6.5–8.1)

## 2018-10-05 MED ORDER — IOHEXOL 300 MG/ML  SOLN
75.0000 mL | Freq: Once | INTRAMUSCULAR | Status: AC | PRN
  Administered 2018-10-05: 75 mL via INTRAVENOUS

## 2018-10-05 MED ORDER — SODIUM CHLORIDE (PF) 0.9 % IJ SOLN
INTRAMUSCULAR | Status: AC
  Filled 2018-10-05: qty 50

## 2018-10-08 ENCOUNTER — Telehealth: Payer: Self-pay | Admitting: *Deleted

## 2018-10-08 NOTE — Telephone Encounter (Signed)
TCT patient to offer telephone visit  In lieu of offcie visit. Patient prefers to come in. Denies Cough, SOB , fever.

## 2018-10-09 ENCOUNTER — Inpatient Hospital Stay (HOSPITAL_BASED_OUTPATIENT_CLINIC_OR_DEPARTMENT_OTHER): Payer: PPO | Admitting: Internal Medicine

## 2018-10-09 ENCOUNTER — Encounter: Payer: Self-pay | Admitting: Internal Medicine

## 2018-10-09 ENCOUNTER — Other Ambulatory Visit: Payer: Self-pay

## 2018-10-09 VITALS — BP 115/66 | HR 54 | Temp 97.8°F | Resp 16 | Wt 153.9 lb

## 2018-10-09 DIAGNOSIS — F17210 Nicotine dependence, cigarettes, uncomplicated: Secondary | ICD-10-CM

## 2018-10-09 DIAGNOSIS — J449 Chronic obstructive pulmonary disease, unspecified: Secondary | ICD-10-CM

## 2018-10-09 DIAGNOSIS — C3431 Malignant neoplasm of lower lobe, right bronchus or lung: Secondary | ICD-10-CM

## 2018-10-09 DIAGNOSIS — R0602 Shortness of breath: Secondary | ICD-10-CM | POA: Diagnosis not present

## 2018-10-09 DIAGNOSIS — M129 Arthropathy, unspecified: Secondary | ICD-10-CM

## 2018-10-09 DIAGNOSIS — Z7982 Long term (current) use of aspirin: Secondary | ICD-10-CM | POA: Diagnosis not present

## 2018-10-09 DIAGNOSIS — C3491 Malignant neoplasm of unspecified part of right bronchus or lung: Secondary | ICD-10-CM

## 2018-10-09 DIAGNOSIS — R51 Headache: Secondary | ICD-10-CM | POA: Diagnosis not present

## 2018-10-09 DIAGNOSIS — I700 Atherosclerosis of aorta: Secondary | ICD-10-CM

## 2018-10-09 DIAGNOSIS — Z8701 Personal history of pneumonia (recurrent): Secondary | ICD-10-CM

## 2018-10-09 DIAGNOSIS — Z79899 Other long term (current) drug therapy: Secondary | ICD-10-CM | POA: Diagnosis not present

## 2018-10-09 DIAGNOSIS — C3490 Malignant neoplasm of unspecified part of unspecified bronchus or lung: Secondary | ICD-10-CM

## 2018-10-09 DIAGNOSIS — N400 Enlarged prostate without lower urinary tract symptoms: Secondary | ICD-10-CM | POA: Diagnosis not present

## 2018-10-09 NOTE — Progress Notes (Signed)
[place] [phone]   Fax:([phone]) [phone]  OFFICE PROGRESS NOTE  [place], [place] [address]  DIAGNOSIS: Stage IA (T1b, N0, M0) non-small cell lung cancer, well-differentiated adenocarcinoma presented with right lower lobe lung nodule diagnosed in January 2018.  PRIOR THERAPY: Video bronchoscopy in addition to right upper lobectomy and wedge resection of the superior segment of the right lower lobe with lymph node dissection under the care of Dr. Servando Snare on 07/19/2016.  CURRENT THERAPY: Observation.  INTERVAL HISTORY: [name] 68 y.o. male returns to the clinic today for follow-up visit.  The patient is feeling fine today with no concerning complaints except for shortness of breath with exertion and intermittent right-sided pain from the surgical scar.  He denied having any fever or chills.  He has no nausea, vomiting, diarrhea or constipation.  He denied having any headache or visual changes.  The patient had repeat CT scan of the chest performed recently and he is here for evaluation and discussion of his scan results.   MEDICAL HISTORY: Past Medical History:  Diagnosis Date  . Adenocarcinoma of right lung, stage 1 ([place]) 09/26/2016  . Arthritis   . Cancer [facility])    pt states possible lung cancer to right lung  . COPD (chronic obstructive pulmonary disease) ([place])   . Dyspnea    with exertion  . Enlarged prostate   . Head injury    a. remotely with cranial plate in place.  Marland Kitchen Headache    neck cramps   . Lung mass    a. @ [facility] - 2.5 cm right upper lobe hypermetabolic mass highly suggestive of primary lung cancer.   . Pneumonia   . Tobacco abuse     ALLERGIES:  has No Known Allergies.  MEDICATIONS:  Current Outpatient Medications  Medication Sig Dispense Refill  . acetaminophen (EQL ARTHRITIS PAIN RELIEF) 650 MG CR tablet Take 650 mg by mouth every 8 (eight) hours as needed for pain.    Marland Kitchen albuterol  (PROVENTIL HFA;VENTOLIN HFA) 108 (90 Base) MCG/ACT inhaler Inhale 2 puffs into the lungs every 6 (six) hours as needed for wheezing or shortness of breath. 1 Inhaler 6  . albuterol (PROVENTIL) (2.5 MG/3ML) 0.083% nebulizer solution Take 2.5 mg by nebulization every 6 (six) hours as needed for wheezing or shortness of breath.    Marland Kitchen aspirin EC 81 MG tablet Take 81 mg by mouth daily.    Marland Kitchen atorvastatin (LIPITOR) 10 MG tablet Take 10 mg by mouth daily.    . budesonide (PULMICORT FLEXHALER) 180 MCG/ACT inhaler Inhale 2 puffs into the lungs 2 (two) times daily. 2 Inhaler 0  . budesonide (PULMICORT) 0.25 MG/2ML nebulizer solution Take 2 mLs (0.25 mg total) by nebulization 2 (two) times daily. DX: J44.9 60 mL 6  . formoterol (PERFOROMIST) 20 MCG/2ML nebulizer solution Take 2 mLs (20 mcg total) by nebulization 2 (two) times daily. DX: J44.9 200 mL 0  . levalbuterol (XOPENEX HFA) 45 MCG/ACT inhaler Inhale 2 puffs into the lungs every 4 (four) hours as needed for shortness of breath. 1 Inhaler 1  . lisinopril (PRINIVIL,ZESTRIL) 10 MG tablet Take 10 mg by mouth daily.    . Revefenacin (YUPELRI) 175 MCG/3ML SOLN Inhale 3 mLs into the lungs daily. 90 mL 0  . Spacer/Aero Chamber Mouthpiece MISC 1 Device by Does not apply route 2 (two) times daily. 1 each 0  . tamsulosin (FLOMAX) 0.4 MG CAPS capsule TAKE ONE  CAPSULE BY MOUTH DAILY AFTER BREAKFAST 30 capsule 0  . traMADol (ULTRAM) 50 MG tablet Take 1 tablet (50 mg total) by mouth every 12 (twelve) hours as needed for severe pain. 30 tablet 0  . umeclidinium bromide (INCRUSE ELLIPTA) 62.5 MCG/INH AEPB Inhale 1 puff into the lungs daily. 30 each 5   No current facility-administered medications for this visit.     SURGICAL HISTORY:  Past Surgical History:  Procedure Laterality Date  . CARPAL TUNNEL RELEASE Right   . COLONOSCOPY    . ELBOW SURGERY     surgery x2 on right elbow  . EYE SURGERY     eye socket fracture, metal plate on left side of face  . HAND  SURGERY     multiple hand surgeries on right hand  . LOBECTOMY Right 07/19/2016   Procedure: LOBECTOMY RIGHT UPPER LOBE WITH WEDGE RESECTION SUPERIOR SEGMENT RIGHT LOWER LOBE WITH PLACEMENT OF ON-Q;  Surgeon: [name], MD;  Location: [place];  Service: Thoracic;  Laterality: Right;  . LYMPH NODE DISSECTION Right 07/19/2016   Procedure: LYMPH NODE DISSECTION;  Surgeon: [name], MD;  Location: [place];  Service: Thoracic;  Laterality: Right;  . SKIN GRAFT     to right hand   . VIDEO ASSISTED THORACOSCOPY Right 08/04/2016   Procedure: RIGHT VIDEO ASSISTED THORACOSCOPY WITH SUTURE CLOSURE OF AIR LEAK;  Surgeon: [name], MD;  Location: [place];  Service: Thoracic;  Laterality: Right;  Marland Kitchen VIDEO ASSISTED THORACOSCOPY (VATS)/WEDGE RESECTION Right 07/19/2016   Procedure: VIDEO ASSISTED THORACOSCOPY (VATS);  Surgeon: [name], MD;  Location: [place];  Service: Thoracic;  Laterality: Right;  Marland Kitchen VIDEO BRONCHOSCOPY N/A 07/19/2016   Procedure: VIDEO BRONCHOSCOPY;  Surgeon: [name], MD;  Location: [place];  Service: Thoracic;  Laterality: N/A;  . VIDEO BRONCHOSCOPY N/A 09/28/2016   Procedure: VIDEO BRONCHOSCOPY, with general anesthesia for removal of interbronchial valves x 3;  Surgeon: [name], MD;  Location: [place];  Service: Thoracic;  Laterality: N/A;  . VIDEO BRONCHOSCOPY WITH INSERTION OF INTERBRONCHIAL VALVE (IBV) Right 08/01/2016   Procedure: VIDEO BRONCHOSCOPY WITH INSERTION OF INTERBRONCHIAL VALVE (IBV);  Surgeon: [name], MD;  Location: [place];  Service: Thoracic;  Laterality: Right;  . WEDGE RESECTION Right 07/19/2016   Procedure: WEDGE  RESECTION RIGHT UPPER LOBE;  Surgeon: [name], MD;  Location: [facility];  Service: Thoracic;  Laterality: Right;    REVIEW OF SYSTEMS:  A comprehensive review of systems was negative except for: Respiratory: positive for dyspnea on exertion and pleurisy/chest pain   PHYSICAL EXAMINATION: General appearance:  alert, cooperative and no distress Head: Normocephalic, without obvious abnormality, atraumatic Neck: no adenopathy, no JVD, supple, symmetrical, trachea midline and thyroid not enlarged, symmetric, no tenderness/mass/nodules Lymph nodes: Cervical, supraclavicular, and axillary nodes normal. Resp: clear to auscultation bilaterally Back: symmetric, no curvature. ROM normal. No CVA tenderness. Cardio: regular rate and rhythm, S1, S2 normal, no murmur, click, rub or gallop GI: soft, non-tender; bowel sounds normal; no masses,  no organomegaly Extremities: extremities normal, atraumatic, no cyanosis or edema  ECOG PERFORMANCE STATUS: 1 - Symptomatic but completely ambulatory  Blood pressure 115/66, pulse (!) 54, temperature 97.8 F (36.6 C), temperature source Oral, resp. rate 16, weight 153 lb 14.4 oz (69.8 kg), SpO2 99 %.  LABORATORY DATA: Lab Results  Component Value Date   WBC 6.5 10/05/2018   HGB 15.4 10/05/2018   HCT 48.1 10/05/2018   MCV 98.0 10/05/2018  PLT 181 10/05/2018      Chemistry      Component Value Date/Time   NA 141 10/05/2018 0942   NA 137 03/27/2017 0944   K 4.1 10/05/2018 0942   K 4.3 03/27/2017 0944   CL 105 10/05/2018 0942   CO2 26 10/05/2018 0942   CO2 25 03/27/2017 0944   BUN 8 10/05/2018 0942   BUN 8.3 03/27/2017 0944   CREATININE 0.89 10/05/2018 0942   CREATININE 0.9 03/27/2017 0944      Component Value Date/Time   CALCIUM 9.3 10/05/2018 0942   CALCIUM 9.7 03/27/2017 0944   ALKPHOS 91 10/05/2018 0942   ALKPHOS 103 03/27/2017 0944   AST 15 10/05/2018 0942   AST 13 03/27/2017 0944   ALT 11 10/05/2018 0942   ALT <6 03/27/2017 0944   BILITOT 0.3 10/05/2018 0942   BILITOT 0.53 03/27/2017 0944       RADIOGRAPHIC STUDIES: Ct Chest W Contrast  Result Date: 10/05/2018 CLINICAL DATA:  Follow-up non-small cell lung cancer. RIGHT lung resection January 2018. EXAM: CT CHEST WITH CONTRAST TECHNIQUE: Multidetector CT imaging of the chest was performed  during intravenous contrast administration. CONTRAST:  22mL OMNIPAQUE IOHEXOL 300 MG/ML  SOLN COMPARISON:  CT 04/12/2018 FINDINGS: Cardiovascular: Coronary artery calcification and aortic atherosclerotic calcification. Mediastinum/Nodes: No axillary supraclavicular adenopathy. No mediastinal hilar adenopathy. No pericardial effusion. Esophagus normal. Lungs/Pleura: Centrilobular emphysema the upper lobes. Postsurgical change in the RIGHT hemithorax with upper lobe resection. No nodularity along the resection margin. Lungs are hyperinflated. There is pleural-parenchymal thickening at the RIGHT lung base without measurable nodularity. Upper Abdomen: Limited view of the liver, kidneys, pancreas are unremarkable. Normal adrenal glands. Musculoskeletal: No aggressive osseous lesion. IMPRESSION: 1. No evidence of lung cancer recurrence at the RIGHT lung resection site. 2. No evidence of lymphadenopathy or metastatic disease. 3. Hyperinflated lungs with centrilobular emphysema. 4. Aortic Atherosclerosis (ICD10-I70.0) and Emphysema (ICD10-J43.9). Electronically Signed   By: Suzy Bouchard M.D.   On: 10/05/2018 13:01    ASSESSMENT AND PLAN: This is a very pleasant 68 years old white male with history of stage IA non-small cell lung cancer status post right upper lobectomy with lymph node dissection in January 2018. The patient is currently on observation and he is feeling fine. He had repeat CT scan of the chest performed recently.  I personally and independently reviewed the scans.  His scan showed no concerning findings for disease recurrence or progression. I recommended for the patient to continue on observation. I will see him back for follow-up visit in 1 year for evaluation with repeat CT scan of the chest. He was advised to call immediately if he has any concerning symptoms in the interval. The patient voices understanding of current disease status and treatment options and is in agreement with the current  care plan. All questions were answered. The patient knows to call the clinic with any problems, questions or concerns. We can certainly see the patient much sooner if necessary.  I spent 10 minutes counseling the patient face to face. The total time spent in the appointment was 15 minutes.  Disclaimer: This note was dictated with voice recognition software. Similar sounding words can inadvertently be transcribed and may not be corrected upon review.

## 2018-10-11 ENCOUNTER — Telehealth: Payer: Self-pay | Admitting: Internal Medicine

## 2018-10-11 NOTE — Telephone Encounter (Signed)
Called regarding schedule °

## 2018-10-30 DIAGNOSIS — I10 Essential (primary) hypertension: Secondary | ICD-10-CM | POA: Diagnosis not present

## 2018-10-30 DIAGNOSIS — Z79899 Other long term (current) drug therapy: Secondary | ICD-10-CM | POA: Diagnosis not present

## 2018-10-30 DIAGNOSIS — F329 Major depressive disorder, single episode, unspecified: Secondary | ICD-10-CM | POA: Diagnosis not present

## 2018-10-30 DIAGNOSIS — E782 Mixed hyperlipidemia: Secondary | ICD-10-CM | POA: Diagnosis not present

## 2018-10-31 DIAGNOSIS — D489 Neoplasm of uncertain behavior, unspecified: Secondary | ICD-10-CM | POA: Diagnosis not present

## 2018-10-31 DIAGNOSIS — L820 Inflamed seborrheic keratosis: Secondary | ICD-10-CM | POA: Diagnosis not present

## 2018-10-31 DIAGNOSIS — D099 Carcinoma in situ, unspecified: Secondary | ICD-10-CM | POA: Diagnosis not present

## 2018-10-31 DIAGNOSIS — Z85828 Personal history of other malignant neoplasm of skin: Secondary | ICD-10-CM | POA: Diagnosis not present

## 2018-11-29 DIAGNOSIS — Z79899 Other long term (current) drug therapy: Secondary | ICD-10-CM | POA: Diagnosis not present

## 2018-11-29 DIAGNOSIS — I10 Essential (primary) hypertension: Secondary | ICD-10-CM | POA: Diagnosis not present

## 2018-11-29 DIAGNOSIS — C3490 Malignant neoplasm of unspecified part of unspecified bronchus or lung: Secondary | ICD-10-CM | POA: Diagnosis not present

## 2018-11-29 DIAGNOSIS — J449 Chronic obstructive pulmonary disease, unspecified: Secondary | ICD-10-CM | POA: Diagnosis not present

## 2018-11-29 DIAGNOSIS — E559 Vitamin D deficiency, unspecified: Secondary | ICD-10-CM | POA: Diagnosis not present

## 2018-12-12 DIAGNOSIS — D489 Neoplasm of uncertain behavior, unspecified: Secondary | ICD-10-CM | POA: Diagnosis not present

## 2018-12-18 DIAGNOSIS — C4491 Basal cell carcinoma of skin, unspecified: Secondary | ICD-10-CM | POA: Diagnosis not present

## 2018-12-31 DIAGNOSIS — J449 Chronic obstructive pulmonary disease, unspecified: Secondary | ICD-10-CM | POA: Diagnosis not present

## 2018-12-31 DIAGNOSIS — Z79899 Other long term (current) drug therapy: Secondary | ICD-10-CM | POA: Diagnosis not present

## 2018-12-31 DIAGNOSIS — Z0000 Encounter for general adult medical examination without abnormal findings: Secondary | ICD-10-CM | POA: Diagnosis not present

## 2018-12-31 DIAGNOSIS — E559 Vitamin D deficiency, unspecified: Secondary | ICD-10-CM | POA: Diagnosis not present

## 2018-12-31 DIAGNOSIS — I10 Essential (primary) hypertension: Secondary | ICD-10-CM | POA: Diagnosis not present

## 2019-01-21 DIAGNOSIS — C4491 Basal cell carcinoma of skin, unspecified: Secondary | ICD-10-CM | POA: Diagnosis not present

## 2019-01-30 DIAGNOSIS — J449 Chronic obstructive pulmonary disease, unspecified: Secondary | ICD-10-CM | POA: Diagnosis not present

## 2019-01-30 DIAGNOSIS — I10 Essential (primary) hypertension: Secondary | ICD-10-CM | POA: Diagnosis not present

## 2019-01-30 DIAGNOSIS — Z131 Encounter for screening for diabetes mellitus: Secondary | ICD-10-CM | POA: Diagnosis not present

## 2019-01-30 DIAGNOSIS — E559 Vitamin D deficiency, unspecified: Secondary | ICD-10-CM | POA: Diagnosis not present

## 2019-01-30 DIAGNOSIS — E7800 Pure hypercholesterolemia, unspecified: Secondary | ICD-10-CM | POA: Diagnosis not present

## 2019-01-30 DIAGNOSIS — R5383 Other fatigue: Secondary | ICD-10-CM | POA: Diagnosis not present

## 2019-01-30 DIAGNOSIS — M129 Arthropathy, unspecified: Secondary | ICD-10-CM | POA: Diagnosis not present

## 2019-01-30 DIAGNOSIS — Z79899 Other long term (current) drug therapy: Secondary | ICD-10-CM | POA: Diagnosis not present

## 2019-02-21 ENCOUNTER — Other Ambulatory Visit: Payer: Self-pay

## 2019-02-21 ENCOUNTER — Ambulatory Visit: Payer: PPO | Admitting: Cardiothoracic Surgery

## 2019-02-21 ENCOUNTER — Encounter: Payer: Self-pay | Admitting: Cardiothoracic Surgery

## 2019-02-21 VITALS — BP 132/78 | HR 58 | Temp 97.3°F | Resp 16 | Ht 72.0 in | Wt 162.0 lb

## 2019-02-21 DIAGNOSIS — J449 Chronic obstructive pulmonary disease, unspecified: Secondary | ICD-10-CM

## 2019-02-21 DIAGNOSIS — Z902 Acquired absence of lung [part of]: Secondary | ICD-10-CM | POA: Diagnosis not present

## 2019-02-21 DIAGNOSIS — C3491 Malignant neoplasm of unspecified part of right bronchus or lung: Secondary | ICD-10-CM | POA: Diagnosis not present

## 2019-02-21 DIAGNOSIS — C3411 Malignant neoplasm of upper lobe, right bronchus or lung: Secondary | ICD-10-CM | POA: Diagnosis not present

## 2019-02-21 NOTE — Progress Notes (Signed)
[address]       [place] [address]             [phone]      [name] [place] Medical Record #[id number] te of Birth: 17-Mar-1951 Da Referring: [name], MD Primary Care: [facility], [place]  Chief Complaint:   POST OP FOLLOW UP  07/19/2016  OPERATIVE REPORT  PREOPERATIVE DIAGNOSIS:  Right upper lobe lung mass. POSTOPERATIVE DIAGNOSIS:  Right upper lobe lung mass. SURGICAL PROCEDURE:  Video bronchoscopy, right video-assisted thoracoscopy, mini thoracotomy, wedge resection of right upper lobe and superior segment of right lower lobe with completion right upper lobectomy, lymph node dissection, and placement of On-Q device. SURGEON:  [name], MD.  08/01/2016 DATE OF DISCHARGE:  OPERATIVE REPORT PREOPERATIVE DIAGNOSIS:  Persistent air leak following right upper lobectomy. POSTOP DIAGNOSIS:  Persistent air leak following right upper lobectomy. SURGICAL PROCEDURE:  Bronchoscopy and placement of Spiration intrabronchial valves x3 in the superior segment of the right lower lobe, in the medial and lateral branches of the right middle lobe. SURGEON:  [name], MD.  08/04/2016 PERATIVE REPORT PREOPERATIVE DIAGNOSIS:  Persistent postoperative air leak. POSTOPERATIVE DIAGNOSIS:  Persistent postoperative air leak. SURGICAL PROCEDURE:  Reoperation with right video-assisted thoracoscopy for persistent air leak with oversewing of the right lower lobe air leak. SURGEON:  [name], MD.  Cancer Staging Adenocarcinoma of right lung, stage 1 [facility]) Staging form: Lung, AJCC 8th Edition - Clinical stage from 09/26/2016: Stage IA2 (cT1b, cN0, cM0) - Signed by Curt Bears, MD on 09/26/2016  Lung cancer [facility]) Staging form: Lung, AJCC 8th Edition - Pathologic stage from 07/22/2016: Stage IA3 (pT1c, pN0, cM0) - Signed by [name], MD on 07/22/2016  History of Present Illness:     Patient returns office today  in follow-up after his lung resection January 2018.  Since last seen he notes that he continues to have some level of shortness of breath, wearing masks and has made this worse.  But he is able to cut his grass has built a deck and painted his garage.  Approximately 8 months ago his his wife died of advanced lung cancer, after very short illness.   He was also told recently by his primary care doctor about borderline glucose elevation And is being evaluated for diabetes  CT scan of the chest was done in April with no evidence of recurrence    Past Medical History:  Diagnosis Date   Adenocarcinoma of right lung, stage 1 ([place]) 09/26/2016   Arthritis    Cancer ([place])    pt states possible lung cancer to right lung   COPD (chronic obstructive pulmonary disease) ([place])    Dyspnea    with exertion   Enlarged prostate    Head injury    a. remotely with cranial plate in place.   Headache    neck cramps    Lung mass    a. @ [facility] - 2.5 cm right upper lobe hypermetabolic mass highly suggestive of primary lung cancer.    Pneumonia    Tobacco abuse      Social History   Tobacco Use  Smoking Status Former Smoker   Packs/day: 1.00   Years: 50.00   Pack years: 50.00   Types: Cigarettes   Quit date: 07/19/2016   Years since quitting: 2.5  Smokeless Tobacco Never Used    Social History   Substance and Sexual Activity  Alcohol Use No  No Known Allergies  Current Outpatient Medications  Medication Sig Dispense Refill   acetaminophen (EQL ARTHRITIS PAIN RELIEF) 650 MG CR tablet Take 650 mg by mouth every 8 (eight) hours as needed for pain.     albuterol (PROVENTIL HFA;VENTOLIN HFA) 108 (90 Base) MCG/ACT inhaler Inhale 2 puffs into the lungs every 6 (six) hours as needed for wheezing or shortness of breath. 1 Inhaler 6   albuterol (PROVENTIL) (2.5 MG/3ML) 0.083% nebulizer solution Take 2.5 mg by nebulization every 6 (six) hours as needed for wheezing or shortness of  breath.     aspirin EC 81 MG tablet Take 81 mg by mouth daily.     atorvastatin (LIPITOR) 10 MG tablet Take 10 mg by mouth daily.     budesonide (PULMICORT FLEXHALER) 180 MCG/ACT inhaler Inhale 2 puffs into the lungs 2 (two) times daily. 2 Inhaler 0   budesonide (PULMICORT) 0.25 MG/2ML nebulizer solution Take 2 mLs (0.25 mg total) by nebulization 2 (two) times daily. DX: J44.9 60 mL 6   formoterol (PERFOROMIST) 20 MCG/2ML nebulizer solution Take 2 mLs (20 mcg total) by nebulization 2 (two) times daily. DX: J44.9 200 mL 0   levalbuterol (XOPENEX HFA) 45 MCG/ACT inhaler Inhale 2 puffs into the lungs every 4 (four) hours as needed for shortness of breath. 1 Inhaler 1   lisinopril (PRINIVIL,ZESTRIL) 10 MG tablet Take 10 mg by mouth daily.     Revefenacin (YUPELRI) 175 MCG/3ML SOLN Inhale 3 mLs into the lungs daily. 90 mL 0   Spacer/Aero Chamber Mouthpiece MISC 1 Device by Does not apply route 2 (two) times daily. 1 each 0   tamsulosin (FLOMAX) 0.4 MG CAPS capsule TAKE ONE CAPSULE BY MOUTH DAILY AFTER BREAKFAST 30 capsule 0   traMADol (ULTRAM) 50 MG tablet Take 1 tablet (50 mg total) by mouth every 12 (twelve) hours as needed for severe pain. 30 tablet 0   umeclidinium bromide (INCRUSE ELLIPTA) 62.5 MCG/INH AEPB Inhale 1 puff into the lungs daily. 30 each 5   No current facility-administered medications for this visit.        Physical Exam: BP 132/78 (BP Location: Right Arm, Patient Position: Sitting, Cuff Size: Normal)    Pulse (!) 58    Temp (!) 97.3 F (36.3 C)    Resp 16    Ht 6' (1.829 m)    Wt 162 lb (73.5 kg)    SpO2 93% Comment: RA   BMI 21.97 kg/m  General appearance: alert, cooperative and no distress Head: Normocephalic, without obvious abnormality, atraumatic Neck: no adenopathy, no carotid bruit, no JVD, supple, symmetrical, trachea midline and thyroid not enlarged, symmetric, no tenderness/mass/nodules Lymph nodes: Cervical, supraclavicular, and axillary nodes  normal. Resp: clear to auscultation bilaterally Cardio: regular rate and rhythm, S1, S2 normal, no murmur, click, rub or gallop GI: soft, non-tender; bowel sounds normal; no masses,  no organomegaly Extremities: extremities normal, atraumatic, no cyanosis or edema and Homans sign is negative, no sign of DVT Neurologic: Grossly normal  Diagnostic Studies & Laboratory data:     Recent Radiology Findings:  CLINICAL DATA:  Follow-up non-small cell lung cancer. RIGHT lung resection January 2018.  EXAM: CT CHEST WITH CONTRAST  TECHNIQUE: Multidetector CT imaging of the chest was performed during intravenous contrast administration.  CONTRAST:  32mL OMNIPAQUE IOHEXOL 300 MG/ML  SOLN  COMPARISON:  CT 04/12/2018  FINDINGS: Cardiovascular: Coronary artery calcification and aortic atherosclerotic calcification.  Mediastinum/Nodes: No axillary supraclavicular adenopathy. No mediastinal hilar adenopathy. No pericardial effusion. Esophagus normal.  Lungs/Pleura: Centrilobular emphysema the upper lobes. Postsurgical change in the RIGHT hemithorax with upper lobe resection. No nodularity along the resection margin. Lungs are hyperinflated. There is pleural-parenchymal thickening at the RIGHT lung base without measurable nodularity.  Upper Abdomen: Limited view of the liver, kidneys, pancreas are unremarkable. Normal adrenal glands.  Musculoskeletal: No aggressive osseous lesion.  IMPRESSION: 1. No evidence of lung cancer recurrence at the RIGHT lung resection site. 2. No evidence of lymphadenopathy or metastatic disease. 3. Hyperinflated lungs with centrilobular emphysema. 4. Aortic Atherosclerosis (ICD10-I70.0) and Emphysema (ICD10-J43.9).   Electronically Signed   By: Suzy Bouchard M.D.   On: 10/05/2018 13:01  I have independently reviewed the above radiology studies  and reviewed the findings with the patient.    CLINICAL DATA:  Non-small-cell lung cancer  of the right lung. Status post right upper lobectomy and wedge resection superior segment right lower lobe.  EXAM: CT CHEST WITHOUT CONTRAST  TECHNIQUE: Multidetector CT imaging of the chest was performed following the standard protocol without IV contrast.  COMPARISON:  09/29/2017  FINDINGS: Cardiovascular: The heart size is normal. No substantial pericardial effusion. Coronary artery calcification is evident. Atherosclerotic calcification is noted in the wall of the thoracic aorta.  Mediastinum/Nodes: No mediastinal lymphadenopathy. No evidence for gross hilar lymphadenopathy although assessment is limited by the lack of intravenous contrast on today's study. The esophagus has normal imaging features. There is no axillary lymphadenopathy.  Lungs/Pleura: The central tracheobronchial airways are patent. Lungs are hyperexpanded with changes of centrilobular and paraseptal emphysema noted. Status post right upper lobectomy. No suspicious pulmonary nodule or mass in the right lung. Left lung demonstrates a new 6 mm lower lobe nodule (88/7). 8 mm nodule in the posterior left lower lobe (126/7) has a suggestion of branching pattern and may represent impaction of distal bronchiectatic airways. No pleural effusion. No pulmonary edema.  Upper Abdomen: Unremarkable  Musculoskeletal: No worrisome lytic or sclerotic osseous abnormality.  IMPRESSION: 1. Interval development of 2 tiny nodules in the left lower lobe. These are likely infectious/inflammatory but close attention on follow-up recommended. 2. Otherwise stable exam. 3.  Aortic Atherosclerois (ICD10-170.0) 4.  Emphysema. (VVO16-W73.9)   Electronically Signed   By: Misty Stanley M.D.   On: 04/13/2018 07:59  CLINICAL DATA:  Right lung cancer. Status post right upper lobectomy. Chronic cough and shortness of breath.  EXAM: CT CHEST WITH CONTRAST  TECHNIQUE: Multidetector CT imaging of the chest was  performed during intravenous contrast administration.  CONTRAST:  21mL ISOVUE-300 IOPAMIDOL (ISOVUE-300) INJECTION 61%  COMPARISON:  03/27/2017  FINDINGS: Cardiovascular: The heart size is normal. No pericardial effusion. Coronary artery calcification is evident. Atherosclerotic calcification is noted in the wall of the thoracic aorta.  Mediastinum/Nodes: No mediastinal lymphadenopathy. There is no hilar lymphadenopathy. The esophagus has normal imaging features. There is no axillary lymphadenopathy.  Lungs/Pleura: Centrilobular emphysema noted. Surgical changes in the right hilum are compatible with the patient's reported history of right upper lobectomy. No suspicious pulmonary nodule or mass. No focal airspace consolidation. No pulmonary edema pleural effusion.  Upper Abdomen: Unremarkable.  Musculoskeletal: Bone windows reveal no worrisome lytic or sclerotic osseous lesions.  IMPRESSION: 1. No new or progressive interval findings to suggest recurrent or metastatic disease in the chest. 2. Interval resolution of right pleural effusion. 3.  Emphysema. (ICD10-J43.9) 4.  Aortic Atherosclerois (ICD10-170.0)   Electronically Signed   By: Misty Stanley M.D.   On: 09/29/2017 14:19  I have independently reviewed the above radiology studies  and reviewed  the findings with the patient.     Ct Chest W Contrast  Result Date: 03/27/2017 CLINICAL DATA:  Right lung cancer diagnosed in January 2018 status post partial right lung resection. EXAM: CT CHEST WITH CONTRAST TECHNIQUE: Multidetector CT imaging of the chest was performed during intravenous contrast administration. CONTRAST:  51mL ISOVUE-300 IOPAMIDOL (ISOVUE-300) INJECTION 61% COMPARISON:  Chest radiographs 01/12/2017. PET-CT 06/22/2016. Outside chest CT 06/08/2016. FINDINGS: Cardiovascular: There is atherosclerosis of the aorta, great vessels and coronary arteries. No acute vascular findings are demonstrated. The  heart size is normal. There is a small amount of fluid in the superior pericardial recess. Mediastinum/Nodes: There are no enlarged mediastinal, hilar or axillary lymph nodes. The thyroid gland, trachea and esophagus demonstrate no significant findings. Lungs/Pleura: Status post right upper lobectomy. There is a small amount of loculated fluid at the right costophrenic angle. There is no pneumothorax. Moderate centrilobular emphysema is noted. There is mild scarring at the right lung base. No confluent airspace opacity or suspicious pulmonary nodule. Upper abdomen: No acute or suspicious findings are seen within the visualized upper abdomen. Oval low-density anterior to the pancreatic body appears to reflect opacified small bowel as correlated with previous CT. There is no evidence of adrenal mass. Musculoskeletal/Chest wall: There is no chest wall mass or suspicious osseous finding. Right-sided thoracotomy defect noted. IMPRESSION: 1. Interval right upper lobe resection without demonstrated complication. There is a small amount of loculated pleural fluid on the right. 2. No evidence of local recurrence or metastatic disease. No suspicious pulmonary nodules. 3. Aortic Atherosclerosis (ICD10-I70.0) and Emphysema (ICD10-J43.9). Electronically Signed   By: Richardean Sale M.D.   On: 03/27/2017 13:22  Ct Cervical Spine Wo Contrast  Result Date: 10/14/2016 CLINICAL DATA:  Back pain. Lesion at C2 may represent a bone metastasis. EXAM: CT CERVICAL SPINE WITHOUT CONTRAST TECHNIQUE: Multidetector CT imaging of the cervical spine was performed without intravenous contrast. Multiplanar CT image reconstructions were also generated. COMPARISON:  MRI of the cervical spine 10/10/2016 CT scan 06/22/2016 FINDINGS: Alignment: Slight anterolisthesis at C7-T1 is stable. AP alignment is otherwise anatomic. Skull base and vertebrae: Marked degenerate changes are present at C1-2. The craniocervical junction is otherwise within normal  limits. There is a well-defined hypodense lesion on the right at C2 which appears to communicate with the joint space. Inflammatory enhancement was noted in the joint space at C1-2 on the MRI scan. This likely represents a bone erosion or Schmorl's node) a metastatic deposit. No other focal lytic or blastic lesions are present. Soft tissues and spinal canal: Atherosclerotic calcifications are present at the carotid bifurcations bilaterally. There is some heterogeneity of the thyroid without a dominant lesion. Disc levels: Uncovertebral spurring contributes to left greater than right foraminal narrowing at C4-5. There is asymmetric facet hypertrophy on the left as well. Facet spurring contributes to left foraminal narrowing at C5-6. Upper chest: Centrilobular emphysema is noted. IMPRESSION: 1. Well-defined 6 mm hypodense lesion on the right at CT appears communicate with the joint. Given inflammatory changes of the joint on the previous MRI, this likely represents erosion or Schmorl's node. The metastatic disease is not entirely excluded, but considered much slightly in the absence of other lesions. Recent PET scan does not demonstrate other osseous lesions. 2. Multilevel spondylosis of the cervical spine as described, left greater than right. 3. Centrilobular emphysema. The patient's known lung nodule was not imaged. Electronically Signed   By: San Morelle M.D.   On: 10/14/2016 17:04   Mr Cervical Spine W Wo  Contrast  Result Date: 10/10/2016 CLINICAL DATA:  Neck pain and stiffness.  Lung cancer. EXAM: MRI CERVICAL SPINE WITHOUT AND WITH CONTRAST TECHNIQUE: Multiplanar and multiecho pulse sequences of the cervical spine, to include the craniocervical junction and cervicothoracic junction, were obtained without and with intravenous contrast. CONTRAST:  13mL MULTIHANCE GADOBENATE DIMEGLUMINE 529 MG/ML IV SOLN COMPARISON:  PET CT 06/22/2016 FINDINGS: Alignment: Grade 1 C4-C5 anterolisthesis. Vertebrae: There  is a low T1/high T2 weighted signal within the superior aspect of the dens, with associated enhancement on postcontrast images, with a linear inferior margin. The aspect to the right of midline within the C2 body is round with more peripheral contrast enhancement. This is visualized on sagittal images only. There is no other focal marrow signal abnormality or contrast enhancement. Cord: Normal signal and caliber. Posterior Fossa, vertebral arteries, paraspinal tissues: Visualized posterior fossa is normal. Vertebral artery flow voids are preserved. Normal visualized paraspinal soft tissues. Disc levels: C1-C2: Normal. C2-C3: Normal disc space and facets. No spinal canal or neuroforaminal stenosis. C3-C4: Normal disc space and facets. No spinal canal or neuroforaminal stenosis. C4-C5: Left-greater-than-right facet hypertrophy and grade 1 anterolisthesis. No spinal canal stenosis. No nerve root impingement. C5-C6: Left-greater-than-right facet and uncovertebral hypertrophy without spinal canal or neural foraminal stenosis. C6-C7: Normal disc space and facets. No spinal canal or neuroforaminal stenosis. C7-T1: Normal disc space and facets. No spinal canal or neuroforaminal stenosis. IMPRESSION: 1. T2 hyperintense, contrast-enhancing area within the superior aspect of the C2 vertebral body. In the context of a patient with known lung cancer, this is most consistent with a focus of metastatic disease. However, the distribution of the enhancement is somewhat atypical. A superimposed pathologic fracture would be difficult to exclude. CT of the cervical spine without contrast is recommended for more complete assessment of the vertebral body cortex. 2. No spinal canal or neural foraminal stenosis. 3. Grade 1 anterolisthesis at C4-C5 secondary to facet hypertrophy. Electronically Signed   By: Ulyses Jarred M.D.   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  He will continue to be followed in the cancer clinic.  He is agreeable to seeing me only as needed.   [name] MD      [address] [place] [address] Office [phone]   [facility]eeper [phone]  02/21/2019 10:08 AM

## 2019-02-25 ENCOUNTER — Other Ambulatory Visit: Payer: Self-pay

## 2019-02-25 ENCOUNTER — Encounter: Payer: Self-pay | Admitting: Primary Care

## 2019-02-25 ENCOUNTER — Ambulatory Visit: Payer: PPO | Admitting: Primary Care

## 2019-02-25 DIAGNOSIS — J449 Chronic obstructive pulmonary disease, unspecified: Secondary | ICD-10-CM | POA: Diagnosis not present

## 2019-02-25 DIAGNOSIS — C3491 Malignant neoplasm of unspecified part of right bronchus or lung: Secondary | ICD-10-CM | POA: Diagnosis not present

## 2019-02-25 MED ORDER — SPIRIVA RESPIMAT 2.5 MCG/ACT IN AERS: 2.0000 | INHALATION_SPRAY | Freq: Every day | RESPIRATORY_TRACT | 5 refills | Status: DC

## 2019-02-25 NOTE — Assessment & Plan Note (Signed)
-   Followed by Dr. Servando Snare, s/p VATS in 2018 for RUL lung mass - CT chest in April showed no recurrence

## 2019-02-25 NOTE — Progress Notes (Signed)
@Patient  ID: [name], male    DOB: 1950-12-13, 68 y.o.   MRN: [id number]  Chief Complaint  Patient presents with  . Follow-up    still with some SOB and wheezing - using nebs and proair - helps - mostly non-prod cough    Referring provider: Milford Cage, PA  HPI: 68 year old male, former smoker quit in 2018 (50 pack year hx). PMH significant for severe COPD with centrilobular emphysema, adenocarcinoma right lung stage 1 s/p right VATS with Dr. Servando Snare on 07/19/2016. CT chest in April showed no evidence of recurrence. Maintained on Perforomist, budesonide and yupelri (prior incruse). He has struggled to pay for his medications in the past.   02/25/2019 Patient presents today for follow up visit. Breathing has been ok, he experiences some shortness of breath with activity. Occasional cough with clear mucus. He mows his lawn and reports fatigue. He wants to continue to stay active. Using Symbicort and proair currently. Not on LAMA. Prefers inhalers versus nebulized treatments. Recently lost his wife to lung cancer 1 year ago and his mother. Having a hard time coping with losses. He is not interested in attending counseling. He has a bad experience and does not trust the process. Likes wood working and makes metal crosses.    No Known Allergies   There is no immunization history on file for this patient.  Past Medical History:  Diagnosis Date  . Adenocarcinoma of right lung, stage 1 ([place]) 09/26/2016  . Arthritis   . Cancer [facility])    pt states possible lung cancer to right lung  . COPD (chronic obstructive pulmonary disease) ([place])   . Dyspnea    with exertion  . Enlarged prostate   . Head injury    a. remotely with cranial plate in place.  Marland Kitchen Headache    neck cramps   . Lung mass    a. @ [facility] - 2.5 cm right upper lobe hypermetabolic mass highly suggestive of primary lung cancer.   . Pneumonia   . Tobacco abuse     Tobacco History: Social History   Tobacco Use   Smoking Status Former Smoker  . Packs/day: 1.00  . Years: 50.00  . Pack years: 50.00  . Types: Cigarettes  . Quit date: 07/19/2016  . Years since quitting: 2.6  Smokeless Tobacco Never Used   Counseling given: Not Answered   Outpatient Medications Prior to Visit  Medication Sig Dispense Refill  . acetaminophen (EQL ARTHRITIS PAIN RELIEF) 650 MG CR tablet Take 650 mg by mouth every 8 (eight) hours as needed for pain.    Marland Kitchen albuterol (PROVENTIL HFA;VENTOLIN HFA) 108 (90 Base) MCG/ACT inhaler Inhale 2 puffs into the lungs every 6 (six) hours as needed for wheezing or shortness of breath. 1 Inhaler 6  . albuterol (PROVENTIL) (2.5 MG/3ML) 0.083% nebulizer solution Take 2.5 mg by nebulization every 6 (six) hours as needed for wheezing or shortness of breath.    Marland Kitchen aspirin EC 81 MG tablet Take 81 mg by mouth daily.    Marland Kitchen atorvastatin (LIPITOR) 10 MG tablet Take 10 mg by mouth daily.    . budesonide-formoterol (SYMBICORT) 160-4.5 MCG/ACT inhaler Inhale 2 puffs into the lungs 2 (two) times daily.    Marland Kitchen lisinopril (PRINIVIL,ZESTRIL) 10 MG tablet Take 10 mg by mouth daily.    Marland Kitchen Spacer/Aero Chamber Mouthpiece MISC 1 Device by Does not apply route 2 (two) times daily. 1 each 0  . tamsulosin (FLOMAX) 0.4 MG CAPS capsule TAKE  ONE CAPSULE BY MOUTH DAILY AFTER BREAKFAST 30 capsule 0  . traMADol (ULTRAM) 50 MG tablet Take 1 tablet (50 mg total) by mouth every 12 (twelve) hours as needed for severe pain. 30 tablet 0  . budesonide (PULMICORT FLEXHALER) 180 MCG/ACT inhaler Inhale 2 puffs into the lungs 2 (two) times daily. 2 Inhaler 0  . budesonide (PULMICORT) 0.25 MG/2ML nebulizer solution Take 2 mLs (0.25 mg total) by nebulization 2 (two) times daily. DX: J44.9 60 mL 6  . formoterol (PERFOROMIST) 20 MCG/2ML nebulizer solution Take 2 mLs (20 mcg total) by nebulization 2 (two) times daily. DX: J44.9 200 mL 0  . levalbuterol (XOPENEX HFA) 45 MCG/ACT inhaler Inhale 2 puffs into the lungs every 4 (four) hours as  needed for shortness of breath. 1 Inhaler 1  . Revefenacin (YUPELRI) 175 MCG/3ML SOLN Inhale 3 mLs into the lungs daily. 90 mL 0  . umeclidinium bromide (INCRUSE ELLIPTA) 62.5 MCG/INH AEPB Inhale 1 puff into the lungs daily. (Patient not taking: Reported on 02/25/2019) 30 each 5   No facility-administered medications prior to visit.     Review of Systems  Review of Systems  Constitutional: Positive for fatigue.  HENT: Negative.   Respiratory: Positive for cough and shortness of breath.   Cardiovascular: Negative.    Physical Exam  BP 122/76 (BP Location: Right Arm, Patient Position: Sitting, Cuff Size: Normal)   Pulse 64   Temp 97.7 F (36.5 C)   Ht 6' (1.829 m)   Wt 156 lb 3.2 oz (70.9 kg)   SpO2 96%   BMI 21.18 kg/m  Physical Exam Constitutional:      Appearance: Normal appearance. He is normal weight.  HENT:     Head: Normocephalic and atraumatic.     Mouth/Throat:     Mouth: Mucous membranes are moist.     Pharynx: Oropharynx is clear.  Cardiovascular:     Rate and Rhythm: Normal rate and regular rhythm.  Pulmonary:     Effort: Pulmonary effort is normal. No respiratory distress.     Breath sounds: Normal breath sounds. No wheezing or rhonchi.     Comments: CTA Musculoskeletal: Normal range of motion.  Skin:    General: Skin is warm and dry.  Neurological:     General: No focal deficit present.     Mental Status: He is alert and oriented to person, place, and time. Mental status is at baseline.  Psychiatric:        Behavior: Behavior normal.        Thought Content: Thought content normal.        Judgment: Judgment normal.     Comments: Flat affect, pleasant       Lab Results:  CBC    Component Value Date/Time   WBC 6.5 10/05/2018 0942   WBC 9.7 09/29/2017 1105   RBC 4.91 10/05/2018 0942   HGB 15.4 10/05/2018 0942   HGB 15.9 03/27/2017 0944   HCT 48.1 10/05/2018 0942   HCT 47.3 03/27/2017 0944   PLT 181 10/05/2018 0942   PLT 187 03/27/2017 0944    PLT 194 07/08/2016 0944   MCV 98.0 10/05/2018 0942   MCV 93.1 03/27/2017 0944   MCH 31.4 10/05/2018 0942   MCHC 32.0 10/05/2018 0942   RDW 13.7 10/05/2018 0942   RDW 14.6 03/27/2017 0944   LYMPHSABS 1.7 10/05/2018 0942   LYMPHSABS 1.5 03/27/2017 0944   MONOABS 0.7 10/05/2018 0942   MONOABS 0.7 03/27/2017 0944   EOSABS 0.1 10/05/2018 0942  EOSABS 0.1 03/27/2017 0944   EOSABS 0.1 07/08/2016 0944   BASOSABS 0.1 10/05/2018 0942   BASOSABS 0.0 03/27/2017 0944    BMET    Component Value Date/Time   NA 141 10/05/2018 0942   NA 137 03/27/2017 0944   K 4.1 10/05/2018 0942   K 4.3 03/27/2017 0944   CL 105 10/05/2018 0942   CO2 26 10/05/2018 0942   CO2 25 03/27/2017 0944   GLUCOSE 105 (H) 10/05/2018 0942   GLUCOSE 95 03/27/2017 0944   BUN 8 10/05/2018 0942   BUN 8.3 03/27/2017 0944   CREATININE 0.89 10/05/2018 0942   CREATININE 0.9 03/27/2017 0944   CALCIUM 9.3 10/05/2018 0942   CALCIUM 9.7 03/27/2017 0944   GFRNONAA >60 10/05/2018 0942   GFRAA >60 10/05/2018 0942    BNP No results found for: BNP  ProBNP No results found for: PROBNP  Imaging: No results found.   Assessment & Plan:   COPD, severe ([place]) - Continue Symbicort 160 two puffs twice daily - Adding back Spiriva respimat - two puffs once daily (samples given) - Continue proair/albuterol every 4-6 hours as needed for shortness of breath/wheezing - REMOVED Pulmicort/Budesonide/Yupelri/Incruse from medication list - Referral to [facility] for COPD/medication management - Follow-up in 6-8 months with Dr. Lake Bells or NP/ Sooner if you develop bronchitis symptoms or worsening shortness of breath    Adenocarcinoma of right lung, stage 1 ([place]) - Followed by Dr. Servando Snare, s/p VATS in 2018 for RUL lung mass - CT chest in April showed no recurrence    Martyn Ehrich, NP 02/25/2019

## 2019-02-25 NOTE — Assessment & Plan Note (Signed)
-   Continue Symbicort 160 two puffs twice daily - Adding back Spiriva respimat - two puffs once daily (samples given) - Continue proair/albuterol every 4-6 hours as needed for shortness of breath/wheezing - REMOVED Pulmicort/Budesonide/Yupelri/Incruse from medication list - Referral to [facility] for COPD/medication management - Follow-up in 6-8 months with Dr. Lake Bells or NP/ Sooner if you develop bronchitis symptoms or worsening shortness of breath

## 2019-02-25 NOTE — Patient Instructions (Signed)
It was nice meeting you, thank you for the cross you made. Continue to find things you enjoy  Continue Symbicort two puff twice daily  Adding back Spiriva respimat - two puff once daily ( samples given)  Continue proair/albuterol every 4-6 hours as needed for shortness of breath/wheezing  Referral to [facility] for COPD/medication management  Follow-up in 6-8 months with Dr. Lake Bells or NP Sooner if you develop bronchitis symptoms or worsening shortness of breath      COPD and Physical Activity Chronic obstructive pulmonary disease (COPD) is a long-term (chronic) condition that affects the lungs. COPD is a general term that can be used to describe many different lung problems that cause lung swelling (inflammation) and limit airflow, including chronic bronchitis and emphysema. The main symptom of COPD is shortness of breath, which makes it harder to do even simple tasks. This can also make it harder to exercise and be active. Talk with your health care provider about treatments to help you breathe better and actions you can take to prevent breathing problems during physical activity. What are the benefits of exercising with COPD? Exercising regularly is an important part of a healthy lifestyle. You can still exercise and do physical activities even though you have COPD. Exercise and physical activity improve your shortness of breath by increasing blood flow (circulation). This causes your heart to pump more oxygen through your body. Moderate exercise can improve your:  Oxygen use.  Energy level.  Shortness of breath.  Strength in your breathing muscles.  Heart health.  Sleep.  Self-esteem and feelings of self-worth.  Depression, stress, and anxiety levels. Exercise can benefit everyone with COPD. The severity of your disease may affect how hard you can exercise, especially at first, but everyone can benefit. Talk with your health care provider about how much exercise is safe for you, and  which activities and exercises are safe for you. What actions can I take to prevent breathing problems during physical activity?  Sign up for a pulmonary rehabilitation program. This type of program may include: ? Education about lung diseases. ? Exercise classes that teach you how to exercise and be more active while improving your breathing. This usually involves:  Exercise using your lower extremities, such as a stationary bicycle.  About 30 minutes of exercise, 2 to 5 times per week, for 6 to 12 weeks  Strength training, such as push ups or leg lifts. ? Nutrition education. ? Group classes in which you can talk with others who also have COPD and learn ways to manage stress.  If you use an oxygen tank, you should use it while you exercise. Work with your health care provider to adjust your oxygen for your physical activity. Your resting flow rate is different from your flow rate during physical activity.  While you are exercising: ? Take slow breaths. ? Pace yourself and do not try to go too fast. ? Purse your lips while breathing out. Pursing your lips is similar to a kissing or whistling position. ? If doing exercise that uses a quick burst of effort, such as weight lifting:  Breathe in before starting the exercise.  Breathe out during the hardest part of the exercise (such as raising the weights). Where to find support You can find support for exercising with COPD from:  Your health care provider.  A pulmonary rehabilitation program.  Your local health department or community health programs.  Support groups, online or in-person. Your health care provider may be able to  recommend support groups. Where to find more information You can find more information about exercising with COPD from:  American Lung Association: ClassInsider.se.  COPD Foundation: https://www.rivera.net/. Contact a health care provider if:  Your symptoms get worse.  You have chest pain.  You have nausea.   You have a fever.  You have trouble talking or catching your breath.  You want to start a new exercise program or a new activity. Summary  COPD is a general term that can be used to describe many different lung problems that cause lung swelling (inflammation) and limit airflow. This includes chronic bronchitis and emphysema.  Exercise and physical activity improve your shortness of breath by increasing blood flow (circulation). This causes your heart to provide more oxygen to your body.  Contact your health care provider before starting any exercise program or new activity. Ask your health care provider what exercises and activities are safe for you. This information is not intended to replace advice given to you by your health care provider. Make sure you discuss any questions you have with your health care provider. Document Released: 07/13/2017 Document Revised: 10/10/2018 Document Reviewed: 07/13/2017 Elsevier Patient Education  2020 [employer].

## 2019-03-01 DIAGNOSIS — I10 Essential (primary) hypertension: Secondary | ICD-10-CM | POA: Diagnosis not present

## 2019-03-01 DIAGNOSIS — R768 Other specified abnormal immunological findings in serum: Secondary | ICD-10-CM | POA: Diagnosis not present

## 2019-03-01 DIAGNOSIS — M5030 Other cervical disc degeneration, unspecified cervical region: Secondary | ICD-10-CM | POA: Diagnosis not present

## 2019-03-01 DIAGNOSIS — Z79899 Other long term (current) drug therapy: Secondary | ICD-10-CM | POA: Diagnosis not present

## 2019-03-01 DIAGNOSIS — J449 Chronic obstructive pulmonary disease, unspecified: Secondary | ICD-10-CM | POA: Diagnosis not present

## 2019-03-04 NOTE — Progress Notes (Signed)
Reviewed, agree 

## 2019-03-14 DIAGNOSIS — L820 Inflamed seborrheic keratosis: Secondary | ICD-10-CM | POA: Diagnosis not present

## 2019-03-14 DIAGNOSIS — L821 Other seborrheic keratosis: Secondary | ICD-10-CM | POA: Diagnosis not present

## 2019-03-14 DIAGNOSIS — Z85828 Personal history of other malignant neoplasm of skin: Secondary | ICD-10-CM | POA: Diagnosis not present

## 2019-03-14 DIAGNOSIS — L570 Actinic keratosis: Secondary | ICD-10-CM | POA: Diagnosis not present

## 2019-04-01 DIAGNOSIS — Z79899 Other long term (current) drug therapy: Secondary | ICD-10-CM | POA: Diagnosis not present

## 2019-04-01 DIAGNOSIS — I10 Essential (primary) hypertension: Secondary | ICD-10-CM | POA: Diagnosis not present

## 2019-04-01 DIAGNOSIS — N401 Enlarged prostate with lower urinary tract symptoms: Secondary | ICD-10-CM | POA: Diagnosis not present

## 2019-04-01 DIAGNOSIS — J449 Chronic obstructive pulmonary disease, unspecified: Secondary | ICD-10-CM | POA: Diagnosis not present

## 2019-04-30 DIAGNOSIS — E7800 Pure hypercholesterolemia, unspecified: Secondary | ICD-10-CM | POA: Diagnosis not present

## 2019-04-30 DIAGNOSIS — Z79899 Other long term (current) drug therapy: Secondary | ICD-10-CM | POA: Diagnosis not present

## 2019-04-30 DIAGNOSIS — M25551 Pain in right hip: Secondary | ICD-10-CM | POA: Diagnosis not present

## 2019-05-31 DIAGNOSIS — Z79899 Other long term (current) drug therapy: Secondary | ICD-10-CM | POA: Diagnosis not present

## 2019-05-31 DIAGNOSIS — I10 Essential (primary) hypertension: Secondary | ICD-10-CM | POA: Diagnosis not present

## 2019-05-31 DIAGNOSIS — E7800 Pure hypercholesterolemia, unspecified: Secondary | ICD-10-CM | POA: Diagnosis not present

## 2019-05-31 DIAGNOSIS — R7303 Prediabetes: Secondary | ICD-10-CM | POA: Diagnosis not present

## 2019-05-31 DIAGNOSIS — Z1159 Encounter for screening for other viral diseases: Secondary | ICD-10-CM | POA: Diagnosis not present

## 2019-05-31 DIAGNOSIS — Z23 Encounter for immunization: Secondary | ICD-10-CM | POA: Diagnosis not present

## 2019-05-31 DIAGNOSIS — Z03818 Encounter for observation for suspected exposure to other biological agents ruled out: Secondary | ICD-10-CM | POA: Diagnosis not present

## 2019-07-01 DIAGNOSIS — Z79899 Other long term (current) drug therapy: Secondary | ICD-10-CM | POA: Diagnosis not present

## 2019-07-01 DIAGNOSIS — E7800 Pure hypercholesterolemia, unspecified: Secondary | ICD-10-CM | POA: Diagnosis not present

## 2019-07-01 DIAGNOSIS — I10 Essential (primary) hypertension: Secondary | ICD-10-CM | POA: Diagnosis not present

## 2019-08-01 DIAGNOSIS — Z79899 Other long term (current) drug therapy: Secondary | ICD-10-CM | POA: Diagnosis not present

## 2019-08-01 DIAGNOSIS — D1721 Benign lipomatous neoplasm of skin and subcutaneous tissue of right arm: Secondary | ICD-10-CM | POA: Diagnosis not present

## 2019-08-01 DIAGNOSIS — Z23 Encounter for immunization: Secondary | ICD-10-CM | POA: Diagnosis not present

## 2019-08-01 DIAGNOSIS — E7800 Pure hypercholesterolemia, unspecified: Secondary | ICD-10-CM | POA: Diagnosis not present

## 2019-08-28 ENCOUNTER — Encounter: Payer: Self-pay | Admitting: Primary Care

## 2019-08-28 ENCOUNTER — Ambulatory Visit: Payer: PPO | Admitting: Primary Care

## 2019-08-28 ENCOUNTER — Other Ambulatory Visit: Payer: Self-pay

## 2019-08-28 DIAGNOSIS — Z902 Acquired absence of lung [part of]: Secondary | ICD-10-CM | POA: Diagnosis not present

## 2019-08-28 DIAGNOSIS — J449 Chronic obstructive pulmonary disease, unspecified: Secondary | ICD-10-CM

## 2019-08-28 MED ORDER — TIOTROPIUM BROMIDE MONOHYDRATE 18 MCG IN CAPS: 18.0000 ug | ORAL_CAPSULE | Freq: Every day | RESPIRATORY_TRACT | 6 refills | Status: DC

## 2019-08-28 NOTE — Assessment & Plan Note (Addendum)
-   Adenocarcinoma right lung stage 1 s/p right VATS with Dr. Servando Snare on 07/19/2016.  - CT chest in April 2020 showed no evidence of reoccurrence.  - Following with Dr. Earlie Server - Consider LDCT program

## 2019-08-28 NOTE — Progress Notes (Signed)
@Patient  ID: [name], male    DOB: 25-Dec-1950, 69 y.o.   MRN: [id number]  Chief Complaint  Patient presents with  . Follow-up    Referring provider: Milford Cage, PA  HPI: 69 year old male, former smoker quit in 2018 (50 pack year hx). PMH significant for severe COPD with centrilobular emphysema, adenocarcinoma right lung stage 1 s/p right VATS with Dr. Servando Snare on 07/19/2016. Previous patient of Dr. Lake Bells. CT chest in April 2020 showed no evidence of recurrence. Maintained on Symbicort + Spiriva, prn albuterol.   Previous [facility] pulmonary encounter: 02/25/2019 Patient presents today for follow up visit. Breathing has been ok, he experiences some shortness of breath with activity. Occasional cough with clear mucus. He mows his lawn and reports fatigue. He wants to continue to stay active. Using Symbicort and proair currently. Not on LAMA. Prefers inhalers versus nebulized treatments. Recently lost his wife to lung cancer 1 year ago and his mother. Having a hard time coping with losses. He is not interested in attending counseling. He has a bad experience and does not trust the process. Likes wood working and makes metal crosses.   08/28/2019 Patient presents today for 6 month regular follow-up. He is doing well, no acute complaints. He is using Symbicort and Spiriva. These are more convenient and he is able to afford them. Still reports that Spiriva respimat cost >$100 dollars. No recent exacerbations requiring abx. He has a cough first this in the morning. Some fatigue with activity but recovers with rest. Denies fever, chills, acute shortness of breath, chest tightness, wheezing, N/V/D.   No Known Allergies   There is no immunization history on file for this patient.  Past Medical History:  Diagnosis Date  . Adenocarcinoma of right lung, stage 1 ([place]) 09/26/2016  . Arthritis   . Cancer [facility])    pt states possible lung cancer to right lung  . COPD (chronic obstructive  pulmonary disease) ([place])   . Dyspnea    with exertion  . Enlarged prostate   . Head injury    a. remotely with cranial plate in place.  Marland Kitchen Headache    neck cramps   . Lung mass    a. @ [facility] - 2.5 cm right upper lobe hypermetabolic mass highly suggestive of primary lung cancer.   . Pneumonia   . Tobacco abuse     Tobacco History: Social History   Tobacco Use  Smoking Status Former Smoker  . Packs/day: 1.00  . Years: 50.00  . Pack years: 50.00  . Types: Cigarettes  . Quit date: 07/19/2016  . Years since quitting: 3.1  Smokeless Tobacco Never Used   Counseling given: Not Answered   Outpatient Medications Prior to Visit  Medication Sig Dispense Refill  . acetaminophen (EQL ARTHRITIS PAIN RELIEF) 650 MG CR tablet Take 650 mg by mouth every 8 (eight) hours as needed for pain.    Marland Kitchen albuterol (PROVENTIL HFA;VENTOLIN HFA) 108 (90 Base) MCG/ACT inhaler Inhale 2 puffs into the lungs every 6 (six) hours as needed for wheezing or shortness of breath. 1 Inhaler 6  . albuterol (PROVENTIL) (2.5 MG/3ML) 0.083% nebulizer solution Take 2.5 mg by nebulization every 6 (six) hours as needed for wheezing or shortness of breath.    Marland Kitchen aspirin EC 81 MG tablet Take 81 mg by mouth daily.    Marland Kitchen atorvastatin (LIPITOR) 10 MG tablet Take 10 mg by mouth daily.    . budesonide-formoterol (SYMBICORT) 160-4.5 MCG/ACT inhaler Inhale 2 puffs  into the lungs 2 (two) times daily.    Marland Kitchen lisinopril (PRINIVIL,ZESTRIL) 10 MG tablet Take 10 mg by mouth daily.    Marland Kitchen Spacer/Aero Chamber Mouthpiece MISC 1 Device by Does not apply route 2 (two) times daily. 1 each 0  . tamsulosin (FLOMAX) 0.4 MG CAPS capsule TAKE ONE CAPSULE BY MOUTH DAILY AFTER BREAKFAST 30 capsule 0  . traMADol (ULTRAM) 50 MG tablet Take 1 tablet (50 mg total) by mouth every 12 (twelve) hours as needed for severe pain. 30 tablet 0  . Tiotropium Bromide Monohydrate (SPIRIVA RESPIMAT) 2.5 MCG/ACT AERS Inhale 2 puffs into the lungs daily. 4 g 5   No  facility-administered medications prior to visit.   Review of Systems  Review of Systems  Constitutional: Positive for fatigue.  HENT: Negative.   Respiratory: Positive for cough. Negative for choking, chest tightness, shortness of breath, wheezing and stridor.   Cardiovascular: Negative.    Physical Exam  BP 140/70 (BP Location: Left Arm, Cuff Size: Large)   Pulse 85   Temp 98 F (36.7 C) (Temporal)   Ht 6' (1.829 m)   Wt 157 lb 3.2 oz (71.3 kg)   SpO2 94%   BMI 21.32 kg/m  Physical Exam Constitutional:      Appearance: Normal appearance.  HENT:     Head: Normocephalic and atraumatic.     Mouth/Throat:     Mouth: Mucous membranes are moist.     Pharynx: Oropharynx is clear.  Cardiovascular:     Rate and Rhythm: Normal rate and regular rhythm.  Pulmonary:     Effort: Pulmonary effort is normal.     Breath sounds: Normal breath sounds. No wheezing or rhonchi.  Musculoskeletal:        General: Normal range of motion.  Skin:    General: Skin is warm and dry.  Neurological:     General: No focal deficit present.     Mental Status: He is alert and oriented to person, place, and time. Mental status is at baseline.  Psychiatric:        Mood and Affect: Mood normal.        Behavior: Behavior normal.        Thought Content: Thought content normal.        Judgment: Judgment normal.      Lab Results:  CBC    Component Value Date/Time   WBC 6.5 10/05/2018 0942   WBC 9.7 09/29/2017 1105   RBC 4.91 10/05/2018 0942   HGB 15.4 10/05/2018 0942   HGB 15.9 03/27/2017 0944   HCT 48.1 10/05/2018 0942   HCT 47.3 03/27/2017 0944   PLT 181 10/05/2018 0942   PLT 187 03/27/2017 0944   PLT 194 07/08/2016 0944   MCV 98.0 10/05/2018 0942   MCV 93.1 03/27/2017 0944   MCH 31.4 10/05/2018 0942   MCHC 32.0 10/05/2018 0942   RDW 13.7 10/05/2018 0942   RDW 14.6 03/27/2017 0944   LYMPHSABS 1.7 10/05/2018 0942   LYMPHSABS 1.5 03/27/2017 0944   MONOABS 0.7 10/05/2018 0942   MONOABS  0.7 03/27/2017 0944   EOSABS 0.1 10/05/2018 0942   EOSABS 0.1 03/27/2017 0944   EOSABS 0.1 07/08/2016 0944   BASOSABS 0.1 10/05/2018 0942   BASOSABS 0.0 03/27/2017 0944    BMET    Component Value Date/Time   NA 141 10/05/2018 0942   NA 137 03/27/2017 0944   K 4.1 10/05/2018 0942   K 4.3 03/27/2017 0944   CL 105 10/05/2018 0942  CO2 26 10/05/2018 0942   CO2 25 03/27/2017 0944   GLUCOSE 105 (H) 10/05/2018 0942   GLUCOSE 95 03/27/2017 0944   BUN 8 10/05/2018 0942   BUN 8.3 03/27/2017 0944   CREATININE 0.89 10/05/2018 0942   CREATININE 0.9 03/27/2017 0944   CALCIUM 9.3 10/05/2018 0942   CALCIUM 9.7 03/27/2017 0944   GFRNONAA >60 10/05/2018 0942   GFRAA >60 10/05/2018 0942    BNP No results found for: BNP  ProBNP No results found for: PROBNP  Imaging: No results found.   Assessment & Plan:   COPD, severe ([place]) - Stable, no recent exacerbations  - Continue Symbicort 160 two puffs twice daily - Change Spiriva respimat to Handihaler d/t cost  - Given information on COVID vaccine - Encouraged patient to stay active - FU in 6 months with new [facility] pulmonary provider (previous [facility] patient)  S/P lobectomy of lung - Adenocarcinoma right lung stage 1 s/p right VATS with Dr. Servando Snare on 07/19/2016.  - CT chest in April 2020 showed no evidence of reoccurrence.  - Following with Dr. Earlie Server - Consider LDCT program    Martyn Ehrich, NP 08/28/2019

## 2019-08-28 NOTE — Patient Instructions (Addendum)
  Recommendations: Continue Symbicort two puffs twice daily  Change Spiriva respimat to Handihaler (should be cheaper option for you) Check with Dr. Earlie Server about LDCT program  Stay active    Follow-up: 6 months with new [facility] pulmonary provider (former [facility] patient) If you develop worsening productive cough or breathing symptoms return sooner   COVID-19 Vaccine Information can be found at: ShippingScam.co.uk For questions related to vaccine distribution or appointments, please email vaccine@[email or web address] .com or call [phone].

## 2019-08-28 NOTE — Assessment & Plan Note (Signed)
-   Stable, no recent exacerbations  - Continue Symbicort 160 two puffs twice daily - Change Spiriva respimat to Handihaler d/t cost  - Given information on COVID vaccine - Encouraged patient to stay active - FU in 6 months with new [facility] pulmonary provider (previous [facility] patient)

## 2019-09-02 DIAGNOSIS — I10 Essential (primary) hypertension: Secondary | ICD-10-CM | POA: Diagnosis not present

## 2019-09-02 DIAGNOSIS — E7800 Pure hypercholesterolemia, unspecified: Secondary | ICD-10-CM | POA: Diagnosis not present

## 2019-09-02 DIAGNOSIS — Z79899 Other long term (current) drug therapy: Secondary | ICD-10-CM | POA: Diagnosis not present

## 2019-09-02 DIAGNOSIS — Z1159 Encounter for screening for other viral diseases: Secondary | ICD-10-CM | POA: Diagnosis not present

## 2019-10-03 DIAGNOSIS — Z79899 Other long term (current) drug therapy: Secondary | ICD-10-CM | POA: Diagnosis not present

## 2019-10-03 DIAGNOSIS — I10 Essential (primary) hypertension: Secondary | ICD-10-CM | POA: Diagnosis not present

## 2019-10-03 DIAGNOSIS — E7800 Pure hypercholesterolemia, unspecified: Secondary | ICD-10-CM | POA: Diagnosis not present

## 2019-10-04 ENCOUNTER — Inpatient Hospital Stay: Payer: PPO | Attending: Internal Medicine

## 2019-10-04 ENCOUNTER — Other Ambulatory Visit: Payer: Self-pay

## 2019-10-04 DIAGNOSIS — Z7982 Long term (current) use of aspirin: Secondary | ICD-10-CM | POA: Insufficient documentation

## 2019-10-04 DIAGNOSIS — Z7951 Long term (current) use of inhaled steroids: Secondary | ICD-10-CM | POA: Insufficient documentation

## 2019-10-04 DIAGNOSIS — Z902 Acquired absence of lung [part of]: Secondary | ICD-10-CM | POA: Insufficient documentation

## 2019-10-04 DIAGNOSIS — Z79899 Other long term (current) drug therapy: Secondary | ICD-10-CM | POA: Diagnosis not present

## 2019-10-04 DIAGNOSIS — J449 Chronic obstructive pulmonary disease, unspecified: Secondary | ICD-10-CM | POA: Diagnosis not present

## 2019-10-04 DIAGNOSIS — C3490 Malignant neoplasm of unspecified part of unspecified bronchus or lung: Secondary | ICD-10-CM

## 2019-10-04 DIAGNOSIS — N400 Enlarged prostate without lower urinary tract symptoms: Secondary | ICD-10-CM | POA: Insufficient documentation

## 2019-10-04 DIAGNOSIS — C3431 Malignant neoplasm of lower lobe, right bronchus or lung: Secondary | ICD-10-CM | POA: Insufficient documentation

## 2019-10-04 DIAGNOSIS — Z85828 Personal history of other malignant neoplasm of skin: Secondary | ICD-10-CM | POA: Diagnosis not present

## 2019-10-04 LAB — CMP (CANCER CENTER ONLY)
ALT: 13 U/L (ref 0–44)
AST: 15 U/L (ref 15–41)
Albumin: 4 g/dL (ref 3.5–5.0)
Alkaline Phosphatase: 90 U/L (ref 38–126)
Anion gap: 10 (ref 5–15)
BUN: 9 mg/dL (ref 8–23)
CO2: 25 mmol/L (ref 22–32)
Calcium: 9.2 mg/dL (ref 8.9–10.3)
Chloride: 103 mmol/L (ref 98–111)
Creatinine: 0.9 mg/dL (ref 0.61–1.24)
GFR, Est AFR Am: >60 mL/min (ref >60)
GFR, Estimated: >60 mL/min (ref >60)
Glucose, Bld: 100 mg/dL — ABNORMAL HIGH (ref 70–99)
Potassium: 4.3 mmol/L (ref 3.5–5.1)
Sodium: 138 mmol/L (ref 135–145)
Total Bilirubin: 0.4 mg/dL (ref 0.3–1.2)
Total Protein: 7.1 g/dL (ref 6.5–8.1)

## 2019-10-04 LAB — CBC WITH DIFFERENTIAL (CANCER CENTER ONLY)
Abs Immature Granulocytes: 0.03 10*3/uL (ref 0.00–0.07)
Basophils Absolute: 0.1 10*3/uL (ref 0.0–0.1)
Basophils Relative: 1 %
Eosinophils Absolute: 0.1 10*3/uL (ref 0.0–0.5)
Eosinophils Relative: 1 %
HCT: 48.4 % (ref 39.0–52.0)
Hemoglobin: 15.9 g/dL (ref 13.0–17.0)
Immature Granulocytes: 0 %
Lymphocytes Relative: 20 %
Lymphs Abs: 1.6 10*3/uL (ref 0.7–4.0)
MCH: 31.2 pg (ref 26.0–34.0)
MCHC: 32.9 g/dL (ref 30.0–36.0)
MCV: 94.9 fL (ref 80.0–100.0)
Monocytes Absolute: 0.8 10*3/uL (ref 0.1–1.0)
Monocytes Relative: 9 %
Neutro Abs: 5.9 10*3/uL (ref 1.7–7.7)
Neutrophils Relative %: 69 %
Platelet Count: 163 10*3/uL (ref 150–400)
RBC: 5.1 MIL/uL (ref 4.22–5.81)
RDW: 13.7 % (ref 11.5–15.5)
WBC Count: 8.4 10*3/uL (ref 4.0–10.5)
nRBC: 0 % (ref 0.0–0.2)

## 2019-10-07 ENCOUNTER — Telehealth: Payer: Self-pay | Admitting: Medical Oncology

## 2019-10-07 NOTE — Telephone Encounter (Signed)
moved f/u to next Monday.

## 2019-10-08 ENCOUNTER — Inpatient Hospital Stay: Payer: PPO | Admitting: Internal Medicine

## 2019-10-09 ENCOUNTER — Other Ambulatory Visit: Payer: Self-pay

## 2019-10-09 ENCOUNTER — Ambulatory Visit (HOSPITAL_COMMUNITY)
Admission: RE | Admit: 2019-10-09 | Discharge: 2019-10-09 | Disposition: A | Discharge status: Home / Self Care | Payer: PPO | Source: Ambulatory Visit | Attending: Internal Medicine | Admitting: Internal Medicine

## 2019-10-09 DIAGNOSIS — C3490 Malignant neoplasm of unspecified part of unspecified bronchus or lung: Secondary | ICD-10-CM | POA: Insufficient documentation

## 2019-10-09 MED ORDER — IOHEXOL 300 MG/ML  SOLN
75.0000 mL | Freq: Once | INTRAMUSCULAR | Status: AC | PRN
  Administered 2019-10-09: 12:00:00 75 mL via INTRAVENOUS

## 2019-10-09 MED ORDER — SODIUM CHLORIDE (PF) 0.9 % IJ SOLN
INTRAMUSCULAR | Status: AC
  Filled 2019-10-09: qty 50

## 2019-10-14 ENCOUNTER — Encounter: Payer: Self-pay | Admitting: Internal Medicine

## 2019-10-14 ENCOUNTER — Inpatient Hospital Stay: Payer: PPO | Admitting: Internal Medicine

## 2019-10-14 ENCOUNTER — Other Ambulatory Visit: Payer: Self-pay

## 2019-10-14 VITALS — BP 123/87 | HR 80 | Temp 98.2°F | Resp 18 | Ht 72.0 in | Wt 159.7 lb

## 2019-10-14 DIAGNOSIS — C3491 Malignant neoplasm of unspecified part of right bronchus or lung: Secondary | ICD-10-CM | POA: Diagnosis not present

## 2019-10-14 DIAGNOSIS — C3490 Malignant neoplasm of unspecified part of unspecified bronchus or lung: Secondary | ICD-10-CM

## 2019-10-14 DIAGNOSIS — C3411 Malignant neoplasm of upper lobe, right bronchus or lung: Secondary | ICD-10-CM

## 2019-10-14 DIAGNOSIS — C3431 Malignant neoplasm of lower lobe, right bronchus or lung: Secondary | ICD-10-CM | POA: Diagnosis not present

## 2019-10-14 NOTE — Progress Notes (Signed)
[place] Telephone:([phone]   Fax:(336) [phone]  OFFICE PROGRESS NOTE  [place], [place] [id number]  DIAGNOSIS: Stage IA (T1b, N0, M0) non-small cell lung cancer, well-differentiated adenocarcinoma presented with right lower lobe lung nodule diagnosed in January 2018.  PRIOR THERAPY: Video bronchoscopy in addition to right upper lobectomy and wedge resection of the superior segment of the right lower lobe with lymph node dissection under the care of Dr. Servando Snare on 07/19/2016.  CURRENT THERAPY: Observation.  INTERVAL HISTORY: [name] 69 y.o. male returns to the clinic today for follow-up visit.  The patient is feeling fine today with no concerning complaints.  He denied having any chest pain, shortness of breath, cough or hemoptysis.  He denied having any fever or chills.  He has no nausea, vomiting, diarrhea or constipation.  He has no recent weight loss or night sweats.  He has no headache or visual changes.  He is here today for evaluation with repeat CT scan of the chest for restaging of his disease.   MEDICAL HISTORY: Past Medical History:  Diagnosis Date  . Adenocarcinoma of right lung, stage 1 ([place]) 09/26/2016  . Arthritis   . Cancer [facility])    pt states possible lung cancer to right lung  . COPD (chronic obstructive pulmonary disease) ([place])   . Dyspnea    with exertion  . Enlarged prostate   . Head injury    a. remotely with cranial plate in place.  Marland Kitchen Headache    neck cramps   . Lung mass    a. @ [facility] - 2.5 cm right upper lobe hypermetabolic mass highly suggestive of primary lung cancer.   . Pneumonia   . Tobacco abuse     ALLERGIES:  has No Known Allergies.  MEDICATIONS:  Current Outpatient Medications  Medication Sig Dispense Refill  . acetaminophen (EQL ARTHRITIS PAIN RELIEF) 650 MG CR tablet Take 650 mg by mouth every 8 (eight) hours as needed for pain.    Marland Kitchen albuterol (PROVENTIL HFA;VENTOLIN HFA) 108  (90 Base) MCG/ACT inhaler Inhale 2 puffs into the lungs every 6 (six) hours as needed for wheezing or shortness of breath. 1 Inhaler 6  . albuterol (PROVENTIL) (2.5 MG/3ML) 0.083% nebulizer solution Take 2.5 mg by nebulization every 6 (six) hours as needed for wheezing or shortness of breath.    Marland Kitchen aspirin EC 81 MG tablet Take 81 mg by mouth daily.    Marland Kitchen atorvastatin (LIPITOR) 10 MG tablet Take 10 mg by mouth daily.    . budesonide-formoterol (SYMBICORT) 160-4.5 MCG/ACT inhaler Inhale 2 puffs into the lungs 2 (two) times daily.    Marland Kitchen lisinopril (PRINIVIL,ZESTRIL) 10 MG tablet Take 10 mg by mouth daily.    Marland Kitchen Spacer/Aero Chamber Mouthpiece MISC 1 Device by Does not apply route 2 (two) times daily. 1 each 0  . tamsulosin (FLOMAX) 0.4 MG CAPS capsule TAKE ONE CAPSULE BY MOUTH DAILY AFTER BREAKFAST 30 capsule 0  . tiotropium (SPIRIVA) 18 MCG inhalation capsule Place 1 capsule (18 mcg total) into inhaler and inhale daily. 30 capsule 6  . traMADol (ULTRAM) 50 MG tablet Take 1 tablet (50 mg total) by mouth every 12 (twelve) hours as needed for severe pain. 30 tablet 0   No current facility-administered medications for this visit.    SURGICAL HISTORY:  Past Surgical History:  Procedure Laterality Date  . CARPAL TUNNEL RELEASE Right   . COLONOSCOPY    . ELBOW  SURGERY     surgery x2 on right elbow  . EYE SURGERY     eye socket fracture, metal plate on left side of face  . HAND SURGERY     multiple hand surgeries on right hand  . LOBECTOMY Right 07/19/2016   Procedure: LOBECTOMY RIGHT UPPER LOBE WITH WEDGE RESECTION SUPERIOR SEGMENT RIGHT LOWER LOBE WITH PLACEMENT OF ON-Q;  Surgeon: [name], MD;  Location: [place];  Service: Thoracic;  Laterality: Right;  . LYMPH NODE DISSECTION Right 07/19/2016   Procedure: LYMPH NODE DISSECTION;  Surgeon: [name], MD;  Location: [place];  Service: Thoracic;  Laterality: Right;  . SKIN CANCER EXCISION    . SKIN GRAFT     to right hand   . VIDEO  ASSISTED THORACOSCOPY Right 08/04/2016   Procedure: RIGHT VIDEO ASSISTED THORACOSCOPY WITH SUTURE CLOSURE OF AIR LEAK;  Surgeon: [name], MD;  Location: [place];  Service: Thoracic;  Laterality: Right;  Marland Kitchen VIDEO ASSISTED THORACOSCOPY (VATS)/WEDGE RESECTION Right 07/19/2016   Procedure: VIDEO ASSISTED THORACOSCOPY (VATS);  Surgeon: [name], MD;  Location: [place];  Service: Thoracic;  Laterality: Right;  Marland Kitchen VIDEO BRONCHOSCOPY N/A 07/19/2016   Procedure: VIDEO BRONCHOSCOPY;  Surgeon: [name], MD;  Location: [place];  Service: Thoracic;  Laterality: N/A;  . VIDEO BRONCHOSCOPY N/A 09/28/2016   Procedure: VIDEO BRONCHOSCOPY, with general anesthesia for removal of interbronchial valves x 3;  Surgeon: [name], MD;  Location: [place];  Service: Thoracic;  Laterality: N/A;  . VIDEO BRONCHOSCOPY WITH INSERTION OF INTERBRONCHIAL VALVE (IBV) Right 08/01/2016   Procedure: VIDEO BRONCHOSCOPY WITH INSERTION OF INTERBRONCHIAL VALVE (IBV);  Surgeon: [name], MD;  Location: [place];  Service: Thoracic;  Laterality: Right;  . WEDGE RESECTION Right 07/19/2016   Procedure: WEDGE  RESECTION RIGHT UPPER LOBE;  Surgeon: [name], MD;  Location: [place];  Service: Thoracic;  Laterality: Right;    REVIEW OF SYSTEMS:  A comprehensive review of systems was negative.   PHYSICAL EXAMINATION: General appearance: alert, cooperative and no distress Head: Normocephalic, without obvious abnormality, atraumatic Neck: no adenopathy, no JVD, supple, symmetrical, trachea midline and thyroid not enlarged, symmetric, no tenderness/mass/nodules Lymph nodes: Cervical, supraclavicular, and axillary nodes normal. Resp: clear to auscultation bilaterally Back: symmetric, no curvature. ROM normal. No CVA tenderness. Cardio: regular rate and rhythm, S1, S2 normal, no murmur, click, rub or gallop GI: soft, non-tender; bowel sounds normal; no masses,  no organomegaly Extremities: extremities normal,  atraumatic, no cyanosis or edema  ECOG PERFORMANCE STATUS: 1 - Symptomatic but completely ambulatory  Blood pressure 123/87, pulse 80, temperature 98.2 F (36.8 C), temperature source Temporal, resp. rate 18, height 6' (1.829 m), weight 159 lb 11.2 oz (72.4 kg), SpO2 100 %.  LABORATORY DATA: Lab Results  Component Value Date   WBC 8.4 10/04/2019   HGB 15.9 10/04/2019   HCT 48.4 10/04/2019   MCV 94.9 10/04/2019   PLT 163 10/04/2019      Chemistry      Component Value Date/Time   NA 138 10/04/2019 0846   NA 137 03/27/2017 0944   K 4.3 10/04/2019 0846   K 4.3 03/27/2017 0944   CL 103 10/04/2019 0846   CO2 25 10/04/2019 0846   CO2 25 03/27/2017 0944   BUN 9 10/04/2019 0846   BUN 8.3 03/27/2017 0944   CREATININE 0.90 10/04/2019 0846   CREATININE 0.9 03/27/2017 0944      Component Value Date/Time  CALCIUM 9.2 10/04/2019 0846   CALCIUM 9.7 03/27/2017 0944   ALKPHOS 90 10/04/2019 0846   ALKPHOS 103 03/27/2017 0944   AST 15 10/04/2019 0846   AST 13 03/27/2017 0944   ALT 13 10/04/2019 0846   ALT <6 03/27/2017 0944   BILITOT 0.4 10/04/2019 0846   BILITOT 0.53 03/27/2017 0944       RADIOGRAPHIC STUDIES: CT Chest W Contrast  Result Date: 10/09/2019 CLINICAL DATA:  Lung cancer, staging. Right upper lobectomy. No chemotherapy or radiation therapy. EXAM: CT CHEST WITH CONTRAST TECHNIQUE: Multidetector CT imaging of the chest was performed during intravenous contrast administration. CONTRAST:  58mL OMNIPAQUE IOHEXOL 300 MG/ML  SOLN COMPARISON:  10/05/2018. FINDINGS: Cardiovascular: Atherosclerotic calcification of the aorta and coronary arteries. Heart size normal. No pericardial effusion. Mediastinum/Nodes: No pathologically enlarged mediastinal, hilar or axillary lymph nodes. Right infrahilar lymph node measures 7 mm, within normal limits. Esophagus is grossly unremarkable. Lungs/Pleura: Centrilobular emphysema. Biapical pleuroparenchymal scarring, left greater than right. Right  upper lobectomy. Lungs are otherwise clear. No pleural fluid. Airway is otherwise unremarkable. Upper Abdomen: Visualized portions of the liver, adrenal glands, kidneys, spleen, pancreas, stomach and bowel are grossly unremarkable. No upper abdominal adenopathy. Musculoskeletal: Degenerative changes in the spine. No worrisome lytic or sclerotic lesions. IMPRESSION: 1. No evidence of recurrent or metastatic disease. 2. Aortic atherosclerosis (ICD10-I70.0). Coronary artery calcification. 3.  Emphysema (ICD10-J43.9). Electronically Signed   By: Lorin Picket M.D.   On: 10/09/2019 14:43    ASSESSMENT AND PLAN: This is a very pleasant 69 years old white male with history of stage IA non-small cell lung cancer status post right upper lobectomy with lymph node dissection in January 2018. The patient is currently on observation and he is feeling fine today with no concerning complaints. He had repeat CT scan of the chest performed recently.  I personally and independently reviewed the scans and discussed the results with the patient today. His scan showed no concerning findings for disease recurrence or metastasis. I recommended for him to continue on observation with repeat CT scan of the chest in 1 year. The patient was advised to call immediately if he has any concerning symptoms in the interval. The patient voices understanding of current disease status and treatment options and is in agreement with the current care plan. All questions were answered. The patient knows to call the clinic with any problems, questions or concerns. We can certainly see the patient much sooner if necessary.  Disclaimer: This note was dictated with voice recognition software. Similar sounding words can inadvertently be transcribed and may not be corrected upon review.

## 2019-10-15 ENCOUNTER — Telehealth: Payer: Self-pay | Admitting: Internal Medicine

## 2019-10-15 NOTE — Telephone Encounter (Signed)
Scheduled per los. Called and left msg. Mailed printout  °

## 2019-11-04 DIAGNOSIS — Z79899 Other long term (current) drug therapy: Secondary | ICD-10-CM | POA: Diagnosis not present

## 2019-12-05 DIAGNOSIS — E7800 Pure hypercholesterolemia, unspecified: Secondary | ICD-10-CM | POA: Diagnosis not present

## 2019-12-05 DIAGNOSIS — Z1159 Encounter for screening for other viral diseases: Secondary | ICD-10-CM | POA: Diagnosis not present

## 2019-12-05 DIAGNOSIS — Z79899 Other long term (current) drug therapy: Secondary | ICD-10-CM | POA: Diagnosis not present

## 2019-12-05 DIAGNOSIS — I10 Essential (primary) hypertension: Secondary | ICD-10-CM | POA: Diagnosis not present

## 2020-01-02 DIAGNOSIS — I10 Essential (primary) hypertension: Secondary | ICD-10-CM | POA: Diagnosis not present

## 2020-01-02 DIAGNOSIS — R7303 Prediabetes: Secondary | ICD-10-CM | POA: Diagnosis not present

## 2020-01-02 DIAGNOSIS — E7800 Pure hypercholesterolemia, unspecified: Secondary | ICD-10-CM | POA: Diagnosis not present

## 2020-01-02 DIAGNOSIS — M5030 Other cervical disc degeneration, unspecified cervical region: Secondary | ICD-10-CM | POA: Diagnosis not present

## 2020-02-17 DIAGNOSIS — J449 Chronic obstructive pulmonary disease, unspecified: Secondary | ICD-10-CM | POA: Diagnosis not present

## 2020-02-17 DIAGNOSIS — N401 Enlarged prostate with lower urinary tract symptoms: Secondary | ICD-10-CM | POA: Diagnosis not present

## 2020-02-17 DIAGNOSIS — N138 Other obstructive and reflux uropathy: Secondary | ICD-10-CM | POA: Diagnosis not present

## 2020-02-17 DIAGNOSIS — I10 Essential (primary) hypertension: Secondary | ICD-10-CM | POA: Diagnosis not present

## 2020-03-18 DIAGNOSIS — J449 Chronic obstructive pulmonary disease, unspecified: Secondary | ICD-10-CM | POA: Diagnosis not present

## 2020-03-18 DIAGNOSIS — G894 Chronic pain syndrome: Secondary | ICD-10-CM | POA: Diagnosis not present

## 2020-03-18 DIAGNOSIS — R5383 Other fatigue: Secondary | ICD-10-CM | POA: Diagnosis not present

## 2020-03-18 DIAGNOSIS — E559 Vitamin D deficiency, unspecified: Secondary | ICD-10-CM | POA: Diagnosis not present

## 2020-03-18 DIAGNOSIS — Z0000 Encounter for general adult medical examination without abnormal findings: Secondary | ICD-10-CM | POA: Diagnosis not present

## 2020-03-18 DIAGNOSIS — I10 Essential (primary) hypertension: Secondary | ICD-10-CM | POA: Diagnosis not present

## 2020-03-18 DIAGNOSIS — Z1159 Encounter for screening for other viral diseases: Secondary | ICD-10-CM | POA: Diagnosis not present

## 2020-03-18 DIAGNOSIS — Z79899 Other long term (current) drug therapy: Secondary | ICD-10-CM | POA: Diagnosis not present

## 2020-03-18 DIAGNOSIS — M129 Arthropathy, unspecified: Secondary | ICD-10-CM | POA: Diagnosis not present

## 2020-03-18 DIAGNOSIS — E7800 Pure hypercholesterolemia, unspecified: Secondary | ICD-10-CM | POA: Diagnosis not present

## 2020-04-30 DIAGNOSIS — G894 Chronic pain syndrome: Secondary | ICD-10-CM | POA: Diagnosis not present

## 2020-04-30 DIAGNOSIS — N138 Other obstructive and reflux uropathy: Secondary | ICD-10-CM | POA: Diagnosis not present

## 2020-04-30 DIAGNOSIS — N401 Enlarged prostate with lower urinary tract symptoms: Secondary | ICD-10-CM | POA: Diagnosis not present

## 2020-06-12 DIAGNOSIS — E559 Vitamin D deficiency, unspecified: Secondary | ICD-10-CM | POA: Diagnosis not present

## 2020-06-12 DIAGNOSIS — Z5320 Procedure and treatment not carried out because of patient's decision for unspecified reasons: Secondary | ICD-10-CM | POA: Diagnosis not present

## 2020-06-12 DIAGNOSIS — R634 Abnormal weight loss: Secondary | ICD-10-CM | POA: Diagnosis not present

## 2020-06-12 DIAGNOSIS — I10 Essential (primary) hypertension: Secondary | ICD-10-CM | POA: Diagnosis not present

## 2020-07-10 DIAGNOSIS — G894 Chronic pain syndrome: Secondary | ICD-10-CM | POA: Diagnosis not present

## 2020-07-10 DIAGNOSIS — E7800 Pure hypercholesterolemia, unspecified: Secondary | ICD-10-CM | POA: Diagnosis not present

## 2020-07-10 DIAGNOSIS — E559 Vitamin D deficiency, unspecified: Secondary | ICD-10-CM | POA: Diagnosis not present

## 2020-07-10 DIAGNOSIS — I10 Essential (primary) hypertension: Secondary | ICD-10-CM | POA: Diagnosis not present

## 2020-07-10 DIAGNOSIS — R634 Abnormal weight loss: Secondary | ICD-10-CM | POA: Diagnosis not present

## 2020-07-28 DIAGNOSIS — J449 Chronic obstructive pulmonary disease, unspecified: Secondary | ICD-10-CM | POA: Diagnosis not present

## 2020-07-28 DIAGNOSIS — C3490 Malignant neoplasm of unspecified part of unspecified bronchus or lung: Secondary | ICD-10-CM | POA: Diagnosis not present

## 2020-07-28 DIAGNOSIS — G894 Chronic pain syndrome: Secondary | ICD-10-CM | POA: Diagnosis not present

## 2020-08-14 DIAGNOSIS — G894 Chronic pain syndrome: Secondary | ICD-10-CM | POA: Diagnosis not present

## 2020-08-14 DIAGNOSIS — J449 Chronic obstructive pulmonary disease, unspecified: Secondary | ICD-10-CM | POA: Diagnosis not present

## 2020-08-14 DIAGNOSIS — E7800 Pure hypercholesterolemia, unspecified: Secondary | ICD-10-CM | POA: Diagnosis not present

## 2020-08-14 DIAGNOSIS — I10 Essential (primary) hypertension: Secondary | ICD-10-CM | POA: Diagnosis not present

## 2020-09-11 DIAGNOSIS — J449 Chronic obstructive pulmonary disease, unspecified: Secondary | ICD-10-CM | POA: Diagnosis not present

## 2020-09-11 DIAGNOSIS — E7800 Pure hypercholesterolemia, unspecified: Secondary | ICD-10-CM | POA: Diagnosis not present

## 2020-09-11 DIAGNOSIS — G894 Chronic pain syndrome: Secondary | ICD-10-CM | POA: Diagnosis not present

## 2020-09-11 DIAGNOSIS — I10 Essential (primary) hypertension: Secondary | ICD-10-CM | POA: Diagnosis not present

## 2020-10-06 ENCOUNTER — Telehealth: Payer: Self-pay | Admitting: Internal Medicine

## 2020-10-06 NOTE — Telephone Encounter (Signed)
Rescheduled lab appointment per 4/5 secure chat. Patient is aware of changes.

## 2020-10-08 ENCOUNTER — Ambulatory Visit (HOSPITAL_COMMUNITY)
Admission: RE | Admit: 2020-10-08 | Discharge: 2020-10-08 | Disposition: A | Discharge status: Home / Self Care | Payer: PPO | Source: Ambulatory Visit | Attending: Internal Medicine | Admitting: Internal Medicine

## 2020-10-08 ENCOUNTER — Other Ambulatory Visit: Payer: Self-pay

## 2020-10-08 ENCOUNTER — Inpatient Hospital Stay: Payer: PPO | Attending: Internal Medicine

## 2020-10-08 DIAGNOSIS — Z902 Acquired absence of lung [part of]: Secondary | ICD-10-CM | POA: Diagnosis not present

## 2020-10-08 DIAGNOSIS — M47814 Spondylosis without myelopathy or radiculopathy, thoracic region: Secondary | ICD-10-CM | POA: Diagnosis not present

## 2020-10-08 DIAGNOSIS — C3490 Malignant neoplasm of unspecified part of unspecified bronchus or lung: Secondary | ICD-10-CM | POA: Insufficient documentation

## 2020-10-08 DIAGNOSIS — Z85118 Personal history of other malignant neoplasm of bronchus and lung: Secondary | ICD-10-CM | POA: Insufficient documentation

## 2020-10-08 DIAGNOSIS — I2510 Atherosclerotic heart disease of native coronary artery without angina pectoris: Secondary | ICD-10-CM | POA: Diagnosis not present

## 2020-10-08 DIAGNOSIS — J432 Centrilobular emphysema: Secondary | ICD-10-CM | POA: Diagnosis not present

## 2020-10-08 LAB — CBC WITH DIFFERENTIAL (CANCER CENTER ONLY)
Abs Immature Granulocytes: 0.01 10*3/uL (ref 0.00–0.07)
Basophils Absolute: 0.1 10*3/uL (ref 0.0–0.1)
Basophils Relative: 1 %
Eosinophils Absolute: 0 10*3/uL (ref 0.0–0.5)
Eosinophils Relative: 1 %
HCT: 43.1 % (ref 39.0–52.0)
Hemoglobin: 14.2 g/dL (ref 13.0–17.0)
Immature Granulocytes: 0 %
Lymphocytes Relative: 27 %
Lymphs Abs: 1.8 10*3/uL (ref 0.7–4.0)
MCH: 31.5 pg (ref 26.0–34.0)
MCHC: 32.9 g/dL (ref 30.0–36.0)
MCV: 95.6 fL (ref 80.0–100.0)
Monocytes Absolute: 0.6 10*3/uL (ref 0.1–1.0)
Monocytes Relative: 9 %
Neutro Abs: 4.1 10*3/uL (ref 1.7–7.7)
Neutrophils Relative %: 62 %
Platelet Count: 169 10*3/uL (ref 150–400)
RBC: 4.51 MIL/uL (ref 4.22–5.81)
RDW: 13.7 % (ref 11.5–15.5)
WBC Count: 6.6 10*3/uL (ref 4.0–10.5)
nRBC: 0 % (ref 0.0–0.2)

## 2020-10-08 LAB — CMP (CANCER CENTER ONLY)
ALT: 12 U/L (ref 0–44)
AST: 16 U/L (ref 15–41)
Albumin: 4 g/dL (ref 3.5–5.0)
Alkaline Phosphatase: 85 U/L (ref 38–126)
Anion gap: 10 (ref 5–15)
BUN: 12 mg/dL (ref 8–23)
CO2: 25 mmol/L (ref 22–32)
Calcium: 8.6 mg/dL — ABNORMAL LOW (ref 8.9–10.3)
Chloride: 104 mmol/L (ref 98–111)
Creatinine: 0.83 mg/dL (ref 0.61–1.24)
GFR, Estimated: >60 mL/min (ref >60)
Glucose, Bld: 89 mg/dL (ref 70–99)
Potassium: 4 mmol/L (ref 3.5–5.1)
Sodium: 139 mmol/L (ref 135–145)
Total Bilirubin: 0.3 mg/dL (ref 0.3–1.2)
Total Protein: 6.7 g/dL (ref 6.5–8.1)

## 2020-10-08 MED ORDER — IOHEXOL 300 MG/ML  SOLN
75.0000 mL | Freq: Once | INTRAMUSCULAR | Status: AC | PRN
  Administered 2020-10-08: 75 mL via INTRAVENOUS

## 2020-10-09 ENCOUNTER — Inpatient Hospital Stay: Payer: PPO

## 2020-10-12 ENCOUNTER — Encounter: Payer: Self-pay | Admitting: Medical Oncology

## 2020-10-12 ENCOUNTER — Inpatient Hospital Stay: Payer: PPO | Admitting: Internal Medicine

## 2020-10-12 ENCOUNTER — Encounter: Payer: Self-pay | Admitting: Internal Medicine

## 2020-10-12 ENCOUNTER — Other Ambulatory Visit: Payer: Self-pay

## 2020-10-12 VITALS — BP 155/77 | HR 69 | Temp 96.6°F | Resp 19 | Ht 72.0 in | Wt 149.8 lb

## 2020-10-12 DIAGNOSIS — Z85118 Personal history of other malignant neoplasm of bronchus and lung: Secondary | ICD-10-CM | POA: Diagnosis not present

## 2020-10-12 DIAGNOSIS — C3490 Malignant neoplasm of unspecified part of unspecified bronchus or lung: Secondary | ICD-10-CM | POA: Diagnosis not present

## 2020-10-12 DIAGNOSIS — C3491 Malignant neoplasm of unspecified part of right bronchus or lung: Secondary | ICD-10-CM | POA: Diagnosis not present

## 2020-10-12 NOTE — Progress Notes (Signed)
[name] Telephone:(336) [phone]   Fax:(336) [phone]  OFFICE PROGRESS NOTE  Sandi Mariscal, MD [name] [id number]  DIAGNOSIS: Stage IA (T1b, N0, M0) non-small cell lung cancer, well-differentiated adenocarcinoma presented with right lower lobe lung nodule diagnosed in January 2018.  PRIOR THERAPY: Video bronchoscopy in addition to right upper lobectomy and wedge resection of the superior segment of the right lower lobe with lymph node dissection under the care of Dr. Servando Snare on 07/19/2016.  CURRENT THERAPY: Observation.  INTERVAL HISTORY: [name] 70 y.o. male returns to the clinic today for follow-up visit.  The patient is feeling fine today with no concerning complaints except for mild fatigue.  He also has some itching on the right side at the place of the surgical scar.  He returned back to work.  He denied having any current chest pain, shortness of breath, cough or hemoptysis.  He denied having any fever or chills.  He has no nausea, vomiting, diarrhea or constipation.  He is here today for evaluation with repeat CT scan of the chest for restaging of his disease.   MEDICAL HISTORY: Past Medical History:  Diagnosis Date  . Adenocarcinoma of right lung, stage 1 ([place]) 09/26/2016  . Arthritis   . Cancer [facility])    pt states possible lung cancer to right lung  . COPD (chronic obstructive pulmonary disease) ([place])   . Dyspnea    with exertion  . Enlarged prostate   . Head injury    a. remotely with cranial plate in place.  Marland Kitchen Headache    neck cramps   . Lung mass    a. @ [facility] - 2.5 cm right upper lobe hypermetabolic mass highly suggestive of primary lung cancer.   . Pneumonia   . Tobacco abuse     ALLERGIES:  has No Known Allergies.  MEDICATIONS:  Current Outpatient Medications  Medication Sig Dispense Refill  . acetaminophen (EQL ARTHRITIS PAIN RELIEF) 650 MG CR tablet Take 650 mg by mouth every 8 (eight) hours as needed for  pain.    Marland Kitchen albuterol (PROVENTIL HFA;VENTOLIN HFA) 108 (90 Base) MCG/ACT inhaler Inhale 2 puffs into the lungs every 6 (six) hours as needed for wheezing or shortness of breath. 1 Inhaler 6  . albuterol (PROVENTIL) (2.5 MG/3ML) 0.083% nebulizer solution Take 2.5 mg by nebulization every 6 (six) hours as needed for wheezing or shortness of breath.    Marland Kitchen aspirin EC 81 MG tablet Take 81 mg by mouth daily.    Marland Kitchen atorvastatin (LIPITOR) 10 MG tablet Take 10 mg by mouth daily.    . budesonide-formoterol (SYMBICORT) 160-4.5 MCG/ACT inhaler Inhale 2 puffs into the lungs 2 (two) times daily.    Marland Kitchen lisinopril (PRINIVIL,ZESTRIL) 10 MG tablet Take 10 mg by mouth daily.    Marland Kitchen Spacer/Aero Chamber Mouthpiece MISC 1 Device by Does not apply route 2 (two) times daily. 1 each 0  . tamsulosin (FLOMAX) 0.4 MG CAPS capsule TAKE ONE CAPSULE BY MOUTH DAILY AFTER BREAKFAST 30 capsule 0  . tiotropium (SPIRIVA) 18 MCG inhalation capsule Place 1 capsule (18 mcg total) into inhaler and inhale daily. 30 capsule 6  . traMADol (ULTRAM) 50 MG tablet Take 1 tablet (50 mg total) by mouth every 12 (twelve) hours as needed for severe pain. 30 tablet 0   No current facility-administered medications for this visit.    SURGICAL HISTORY:  Past Surgical History:  Procedure Laterality Date  . CARPAL TUNNEL RELEASE  Right   . COLONOSCOPY    . ELBOW SURGERY     surgery x2 on right elbow  . EYE SURGERY     eye socket fracture, metal plate on left side of face  . HAND SURGERY     multiple hand surgeries on right hand  . LOBECTOMY Right 07/19/2016   Procedure: LOBECTOMY RIGHT UPPER LOBE WITH WEDGE RESECTION SUPERIOR SEGMENT RIGHT LOWER LOBE WITH PLACEMENT OF ON-Q;  Surgeon: [name], MD;  Location: [place];  Service: Thoracic;  Laterality: Right;  . LYMPH NODE DISSECTION Right 07/19/2016   Procedure: LYMPH NODE DISSECTION;  Surgeon: [name], MD;  Location: [place];  Service: Thoracic;  Laterality: Right;  . SKIN CANCER  EXCISION    . SKIN GRAFT     to right hand   . VIDEO ASSISTED THORACOSCOPY Right 08/04/2016   Procedure: RIGHT VIDEO ASSISTED THORACOSCOPY WITH SUTURE CLOSURE OF AIR LEAK;  Surgeon: [name], MD;  Location: [place];  Service: Thoracic;  Laterality: Right;  Marland Kitchen VIDEO ASSISTED THORACOSCOPY (VATS)/WEDGE RESECTION Right 07/19/2016   Procedure: VIDEO ASSISTED THORACOSCOPY (VATS);  Surgeon: [name], MD;  Location: [place];  Service: Thoracic;  Laterality: Right;  Marland Kitchen VIDEO BRONCHOSCOPY N/A 07/19/2016   Procedure: VIDEO BRONCHOSCOPY;  Surgeon: [name], MD;  Location: [place];  Service: Thoracic;  Laterality: N/A;  . VIDEO BRONCHOSCOPY N/A 09/28/2016   Procedure: VIDEO BRONCHOSCOPY, with general anesthesia for removal of interbronchial valves x 3;  Surgeon: [name], MD;  Location: [place];  Service: Thoracic;  Laterality: N/A;  . VIDEO BRONCHOSCOPY WITH INSERTION OF INTERBRONCHIAL VALVE (IBV) Right 08/01/2016   Procedure: VIDEO BRONCHOSCOPY WITH INSERTION OF INTERBRONCHIAL VALVE (IBV);  Surgeon: [name], MD;  Location: [place];  Service: Thoracic;  Laterality: Right;  . WEDGE RESECTION Right 07/19/2016   Procedure: WEDGE  RESECTION RIGHT UPPER LOBE;  Surgeon: [name], MD;  Location: [place];  Service: Thoracic;  Laterality: Right;    REVIEW OF SYSTEMS:  A comprehensive review of systems was negative.   PHYSICAL EXAMINATION: General appearance: alert, cooperative and no distress Head: Normocephalic, without obvious abnormality, atraumatic Neck: no adenopathy, no JVD, supple, symmetrical, trachea midline and thyroid not enlarged, symmetric, no tenderness/mass/nodules Lymph nodes: Cervical, supraclavicular, and axillary nodes normal. Resp: clear to auscultation bilaterally Back: symmetric, no curvature. ROM normal. No CVA tenderness. Cardio: regular rate and rhythm, S1, S2 normal, no murmur, click, rub or gallop GI: soft, non-tender; bowel sounds normal; no masses,   no organomegaly Extremities: extremities normal, atraumatic, no cyanosis or edema  ECOG PERFORMANCE STATUS: 1 - Symptomatic but completely ambulatory  Blood pressure (!) 155/77, pulse 69, temperature (!) 96.6 F (35.9 C), temperature source Tympanic, resp. rate 19, height 6' (1.829 m), weight 149 lb 12.8 oz (67.9 kg), SpO2 100 %.  LABORATORY DATA: Lab Results  Component Value Date   WBC 6.6 10/08/2020   HGB 14.2 10/08/2020   HCT 43.1 10/08/2020   MCV 95.6 10/08/2020   PLT 169 10/08/2020      Chemistry      Component Value Date/Time   NA 139 10/08/2020 1330   NA 137 03/27/2017 0944   K 4.0 10/08/2020 1330   K 4.3 03/27/2017 0944   CL 104 10/08/2020 1330   CO2 25 10/08/2020 1330   CO2 25 03/27/2017 0944   BUN 12 10/08/2020 1330   BUN 8.3 03/27/2017 0944   CREATININE 0.83 10/08/2020 1330   CREATININE  0.9 03/27/2017 0944      Component Value Date/Time   CALCIUM 8.6 (L) 10/08/2020 1330   CALCIUM 9.7 03/27/2017 0944   ALKPHOS 85 10/08/2020 1330   ALKPHOS 103 03/27/2017 0944   AST 16 10/08/2020 1330   AST 13 03/27/2017 0944   ALT 12 10/08/2020 1330   ALT <6 03/27/2017 0944   BILITOT 0.3 10/08/2020 1330   BILITOT 0.53 03/27/2017 0944       RADIOGRAPHIC STUDIES: CT Chest W Contrast  Result Date: 10/09/2020 CLINICAL DATA:  Primary Cancer Type: Lung Imaging Indication: Routine surveillance Interval therapy since last imaging? No Initial Cancer Diagnosis Date: 07/19/2016; Established by: Biopsy-proven Detailed Pathology: Stage IA non-small cell lung cancer, well-differentiated adenocarcinoma. Primary Tumor location: Right upper lobe. Surgeries: Right upper lobectomy and wedge resection of the superior segment of the right lower lobe 07/19/2016. Chemotherapy: No Immunotherapy? No Radiation therapy? No EXAM: CT CHEST WITH CONTRAST TECHNIQUE: Multidetector CT imaging of the chest was performed during intravenous contrast administration. CONTRAST:  60mL OMNIPAQUE IOHEXOL 300 MG/ML   SOLN COMPARISON:  Most recent CT chest 10/09/2019. FINDINGS: Cardiovascular: Normal heart size. No significant pericardial effusion/thickening. Three-vessel coronary atherosclerosis. Atherosclerotic nonaneurysmal thoracic aorta. Normal caliber pulmonary arteries. No central pulmonary emboli. Mediastinum/Nodes: No discrete thyroid nodules. Unremarkable esophagus. No pathologically enlarged axillary, mediastinal or hilar lymph nodes. Lungs/Pleura: No pneumothorax. No pleural effusion. Status post right upper lobectomy. Severe centrilobular emphysema with mild diffuse bronchial wall thickening. Irregular focal curvilinear subpleural thickened bandlike opacity in the posterior apical left upper lobe (series 7/image 21) is stable since at least 04/12/2018 chest CT, compatible with a benign scar. Stable small parenchymal bands at the right lung base. No acute consolidative airspace disease, lung masses or new significant pulmonary nodules. Upper abdomen: No acute abnormality. Musculoskeletal: No aggressive appearing focal osseous lesions. Mild thoracic spondylosis. IMPRESSION: 1. No evidence of local tumor recurrence status post right upper lobectomy. 2. No evidence of metastatic disease in the chest. 3. Severe centrilobular emphysema with mild diffuse bronchial wall thickening, suggesting COPD. 4. Three-vessel coronary atherosclerosis. 5. Aortic Atherosclerosis (ICD10-I70.0) and Emphysema (ICD10-J43.9). Electronically Signed   By: Ilona Sorrel M.D.   On: 10/09/2020 10:28    ASSESSMENT AND PLAN: This is a very pleasant 70 years old white male with history of stage IA non-small cell lung cancer status post right upper lobectomy with lymph node dissection in January 2018. The patient is currently on observation and he is feeling fine today with no concerning complaints. He had repeat CT scan of the chest performed recently.  I personally and independently reviewed the scans and discussed the results with the patient  today. His scan showed no concerning findings for disease recurrence or metastasis. I recommended for him to continue on observation with repeat CT scan of the chest in 1 year. He was advised to call immediately if he has any concerning symptoms in the interval. The patient voices understanding of current disease status and treatment options and is in agreement with the current care plan. All questions were answered. The patient knows to call the clinic with any problems, questions or concerns. We can certainly see the patient much sooner if necessary.  Disclaimer: This note was dictated with voice recognition software. Similar sounding words can inadvertently be transcribed and may not be corrected upon review.

## 2020-10-12 NOTE — Progress Notes (Unsigned)
Letter for work given to pt.

## 2020-10-19 DIAGNOSIS — J3089 Other allergic rhinitis: Secondary | ICD-10-CM | POA: Diagnosis not present

## 2020-10-19 DIAGNOSIS — C3490 Malignant neoplasm of unspecified part of unspecified bronchus or lung: Secondary | ICD-10-CM | POA: Diagnosis not present

## 2020-10-19 DIAGNOSIS — G894 Chronic pain syndrome: Secondary | ICD-10-CM | POA: Diagnosis not present

## 2020-10-19 DIAGNOSIS — Z79899 Other long term (current) drug therapy: Secondary | ICD-10-CM | POA: Diagnosis not present

## 2020-10-19 DIAGNOSIS — E7800 Pure hypercholesterolemia, unspecified: Secondary | ICD-10-CM | POA: Diagnosis not present

## 2020-10-19 DIAGNOSIS — I10 Essential (primary) hypertension: Secondary | ICD-10-CM | POA: Diagnosis not present

## 2020-10-24 DIAGNOSIS — I10 Essential (primary) hypertension: Secondary | ICD-10-CM | POA: Diagnosis not present

## 2020-10-24 DIAGNOSIS — J22 Unspecified acute lower respiratory infection: Secondary | ICD-10-CM | POA: Diagnosis not present

## 2020-10-24 DIAGNOSIS — E7800 Pure hypercholesterolemia, unspecified: Secondary | ICD-10-CM | POA: Diagnosis not present

## 2020-11-06 DIAGNOSIS — E7800 Pure hypercholesterolemia, unspecified: Secondary | ICD-10-CM | POA: Diagnosis not present

## 2020-11-06 DIAGNOSIS — J449 Chronic obstructive pulmonary disease, unspecified: Secondary | ICD-10-CM | POA: Diagnosis not present

## 2020-11-06 DIAGNOSIS — I10 Essential (primary) hypertension: Secondary | ICD-10-CM | POA: Diagnosis not present

## 2020-11-20 DIAGNOSIS — E7800 Pure hypercholesterolemia, unspecified: Secondary | ICD-10-CM | POA: Diagnosis not present

## 2020-11-20 DIAGNOSIS — J449 Chronic obstructive pulmonary disease, unspecified: Secondary | ICD-10-CM | POA: Diagnosis not present

## 2020-11-20 DIAGNOSIS — I10 Essential (primary) hypertension: Secondary | ICD-10-CM | POA: Diagnosis not present

## 2020-11-20 DIAGNOSIS — G894 Chronic pain syndrome: Secondary | ICD-10-CM | POA: Diagnosis not present

## 2020-12-18 DIAGNOSIS — Z79899 Other long term (current) drug therapy: Secondary | ICD-10-CM | POA: Diagnosis not present

## 2020-12-18 DIAGNOSIS — M5030 Other cervical disc degeneration, unspecified cervical region: Secondary | ICD-10-CM | POA: Diagnosis not present

## 2021-01-15 DIAGNOSIS — Z79899 Other long term (current) drug therapy: Secondary | ICD-10-CM | POA: Diagnosis not present

## 2021-01-15 DIAGNOSIS — J449 Chronic obstructive pulmonary disease, unspecified: Secondary | ICD-10-CM | POA: Diagnosis not present

## 2021-01-15 DIAGNOSIS — M5030 Other cervical disc degeneration, unspecified cervical region: Secondary | ICD-10-CM | POA: Diagnosis not present

## 2021-02-15 DIAGNOSIS — B9689 Other specified bacterial agents as the cause of diseases classified elsewhere: Secondary | ICD-10-CM | POA: Diagnosis not present

## 2021-02-15 DIAGNOSIS — Z1152 Encounter for screening for COVID-19: Secondary | ICD-10-CM | POA: Diagnosis not present

## 2021-02-15 DIAGNOSIS — Z9189 Other specified personal risk factors, not elsewhere classified: Secondary | ICD-10-CM | POA: Diagnosis not present

## 2021-02-15 DIAGNOSIS — J329 Chronic sinusitis, unspecified: Secondary | ICD-10-CM | POA: Diagnosis not present

## 2021-02-19 DIAGNOSIS — M129 Arthropathy, unspecified: Secondary | ICD-10-CM | POA: Diagnosis not present

## 2021-02-19 DIAGNOSIS — M5030 Other cervical disc degeneration, unspecified cervical region: Secondary | ICD-10-CM | POA: Diagnosis not present

## 2021-02-19 DIAGNOSIS — E559 Vitamin D deficiency, unspecified: Secondary | ICD-10-CM | POA: Diagnosis not present

## 2021-02-19 DIAGNOSIS — J449 Chronic obstructive pulmonary disease, unspecified: Secondary | ICD-10-CM | POA: Diagnosis not present

## 2021-02-19 DIAGNOSIS — Z79899 Other long term (current) drug therapy: Secondary | ICD-10-CM | POA: Diagnosis not present

## 2021-02-19 DIAGNOSIS — R5383 Other fatigue: Secondary | ICD-10-CM | POA: Diagnosis not present

## 2021-02-19 DIAGNOSIS — E7800 Pure hypercholesterolemia, unspecified: Secondary | ICD-10-CM | POA: Diagnosis not present

## 2021-03-19 DIAGNOSIS — Z79899 Other long term (current) drug therapy: Secondary | ICD-10-CM | POA: Diagnosis not present

## 2021-03-19 DIAGNOSIS — M5030 Other cervical disc degeneration, unspecified cervical region: Secondary | ICD-10-CM | POA: Diagnosis not present

## 2021-03-19 DIAGNOSIS — J449 Chronic obstructive pulmonary disease, unspecified: Secondary | ICD-10-CM | POA: Diagnosis not present

## 2021-04-16 DIAGNOSIS — Z79899 Other long term (current) drug therapy: Secondary | ICD-10-CM | POA: Diagnosis not present

## 2021-04-16 DIAGNOSIS — R0989 Other specified symptoms and signs involving the circulatory and respiratory systems: Secondary | ICD-10-CM | POA: Diagnosis not present

## 2021-04-16 DIAGNOSIS — M5030 Other cervical disc degeneration, unspecified cervical region: Secondary | ICD-10-CM | POA: Diagnosis not present

## 2021-04-16 DIAGNOSIS — Z23 Encounter for immunization: Secondary | ICD-10-CM | POA: Diagnosis not present

## 2021-04-16 DIAGNOSIS — J449 Chronic obstructive pulmonary disease, unspecified: Secondary | ICD-10-CM | POA: Diagnosis not present

## 2021-04-23 DIAGNOSIS — M79604 Pain in right leg: Secondary | ICD-10-CM | POA: Diagnosis not present

## 2021-04-23 DIAGNOSIS — R0989 Other specified symptoms and signs involving the circulatory and respiratory systems: Secondary | ICD-10-CM | POA: Diagnosis not present

## 2021-04-23 DIAGNOSIS — M79605 Pain in left leg: Secondary | ICD-10-CM | POA: Diagnosis not present

## 2021-05-14 DIAGNOSIS — N401 Enlarged prostate with lower urinary tract symptoms: Secondary | ICD-10-CM | POA: Diagnosis not present

## 2021-05-14 DIAGNOSIS — J449 Chronic obstructive pulmonary disease, unspecified: Secondary | ICD-10-CM | POA: Diagnosis not present

## 2021-05-14 DIAGNOSIS — M5030 Other cervical disc degeneration, unspecified cervical region: Secondary | ICD-10-CM | POA: Diagnosis not present

## 2021-05-14 DIAGNOSIS — Z79899 Other long term (current) drug therapy: Secondary | ICD-10-CM | POA: Diagnosis not present

## 2021-05-21 DIAGNOSIS — Z79899 Other long term (current) drug therapy: Secondary | ICD-10-CM | POA: Diagnosis not present

## 2021-06-11 DIAGNOSIS — M129 Arthropathy, unspecified: Secondary | ICD-10-CM | POA: Diagnosis not present

## 2021-10-11 ENCOUNTER — Ambulatory Visit (HOSPITAL_COMMUNITY)
Admission: RE | Admit: 2021-10-11 | Discharge: 2021-10-11 | Disposition: A | Discharge status: Home / Self Care | Payer: PPO | Source: Ambulatory Visit | Attending: Internal Medicine | Admitting: Internal Medicine

## 2021-10-11 ENCOUNTER — Inpatient Hospital Stay: Payer: PPO | Attending: Internal Medicine

## 2021-10-11 ENCOUNTER — Other Ambulatory Visit: Payer: Self-pay

## 2021-10-11 DIAGNOSIS — Z7982 Long term (current) use of aspirin: Secondary | ICD-10-CM | POA: Insufficient documentation

## 2021-10-11 DIAGNOSIS — Z79899 Other long term (current) drug therapy: Secondary | ICD-10-CM | POA: Insufficient documentation

## 2021-10-11 DIAGNOSIS — C3490 Malignant neoplasm of unspecified part of unspecified bronchus or lung: Secondary | ICD-10-CM

## 2021-10-11 DIAGNOSIS — Z902 Acquired absence of lung [part of]: Secondary | ICD-10-CM | POA: Insufficient documentation

## 2021-10-11 DIAGNOSIS — Z85118 Personal history of other malignant neoplasm of bronchus and lung: Secondary | ICD-10-CM | POA: Insufficient documentation

## 2021-10-11 LAB — CBC WITH DIFFERENTIAL (CANCER CENTER ONLY)
Abs Immature Granulocytes: 0.01 10*3/uL (ref 0.00–0.07)
Basophils Absolute: 0 10*3/uL (ref 0.0–0.1)
Basophils Relative: 0 %
Eosinophils Absolute: 0.1 10*3/uL (ref 0.0–0.5)
Eosinophils Relative: 1 %
HCT: 44.8 % (ref 39.0–52.0)
Hemoglobin: 14.8 g/dL (ref 13.0–17.0)
Immature Granulocytes: 0 %
Lymphocytes Relative: 25 %
Lymphs Abs: 1.4 10*3/uL (ref 0.7–4.0)
MCH: 31.6 pg (ref 26.0–34.0)
MCHC: 33 g/dL (ref 30.0–36.0)
MCV: 95.7 fL (ref 80.0–100.0)
Monocytes Absolute: 0.5 10*3/uL (ref 0.1–1.0)
Monocytes Relative: 8 %
Neutro Abs: 3.7 10*3/uL (ref 1.7–7.7)
Neutrophils Relative %: 66 %
Platelet Count: 182 10*3/uL (ref 150–400)
RBC: 4.68 MIL/uL (ref 4.22–5.81)
RDW: 13.7 % (ref 11.5–15.5)
WBC Count: 5.7 10*3/uL (ref 4.0–10.5)
nRBC: 0 % (ref 0.0–0.2)

## 2021-10-11 LAB — CMP (CANCER CENTER ONLY)
ALT: 10 U/L (ref 0–44)
AST: 14 U/L — ABNORMAL LOW (ref 15–41)
Albumin: 3.8 g/dL (ref 3.5–5.0)
Alkaline Phosphatase: 73 U/L (ref 38–126)
Anion gap: 3 — ABNORMAL LOW (ref 5–15)
BUN: 15 mg/dL (ref 8–23)
CO2: 29 mmol/L (ref 22–32)
Calcium: 9.1 mg/dL (ref 8.9–10.3)
Chloride: 107 mmol/L (ref 98–111)
Creatinine: 0.89 mg/dL (ref 0.61–1.24)
GFR, Estimated: >60 mL/min (ref >60)
Glucose, Bld: 99 mg/dL (ref 70–99)
Potassium: 4.9 mmol/L (ref 3.5–5.1)
Sodium: 139 mmol/L (ref 135–145)
Total Bilirubin: 0.5 mg/dL (ref 0.3–1.2)
Total Protein: 6.3 g/dL — ABNORMAL LOW (ref 6.5–8.1)

## 2021-10-11 MED ORDER — IOHEXOL 300 MG/ML  SOLN
75.0000 mL | Freq: Once | INTRAMUSCULAR | Status: AC | PRN
  Administered 2021-10-11: 75 mL via INTRAVENOUS

## 2021-10-13 ENCOUNTER — Inpatient Hospital Stay (HOSPITAL_BASED_OUTPATIENT_CLINIC_OR_DEPARTMENT_OTHER): Payer: PPO | Admitting: Internal Medicine

## 2021-10-13 ENCOUNTER — Other Ambulatory Visit: Payer: Self-pay

## 2021-10-13 VITALS — BP 133/72 | HR 61 | Temp 97.0°F | Resp 19 | Ht 72.0 in | Wt 146.3 lb

## 2021-10-13 DIAGNOSIS — Z79899 Other long term (current) drug therapy: Secondary | ICD-10-CM | POA: Diagnosis not present

## 2021-10-13 DIAGNOSIS — C3490 Malignant neoplasm of unspecified part of unspecified bronchus or lung: Secondary | ICD-10-CM

## 2021-10-13 DIAGNOSIS — C3431 Malignant neoplasm of lower lobe, right bronchus or lung: Secondary | ICD-10-CM | POA: Diagnosis not present

## 2021-10-13 DIAGNOSIS — Z7982 Long term (current) use of aspirin: Secondary | ICD-10-CM | POA: Diagnosis not present

## 2021-10-13 DIAGNOSIS — Z85118 Personal history of other malignant neoplasm of bronchus and lung: Secondary | ICD-10-CM | POA: Diagnosis present

## 2021-10-13 DIAGNOSIS — Z902 Acquired absence of lung [part of]: Secondary | ICD-10-CM | POA: Diagnosis not present

## 2021-10-13 NOTE — Progress Notes (Signed)
?    [facility] ?[phone]   [phone] ? ?OFFICE PROGRESS NOTE ? ?Sandi Mariscal, MD ?[place] ?[place] [address] ? ?DIAGNOSIS: Stage IA (T1b, N0, M0) non-small cell lung cancer, well-differentiated adenocarcinoma presented with right lower lobe lung nodule diagnosed in January 2018. ? ?PRIOR THERAPY: Video bronchoscopy in addition to right upper lobectomy and wedge resection of the superior segment of the right lower lobe with lymph node dissection under the care of Dr. Servando Snare on 07/19/2016. ? ?CURRENT THERAPY: Observation. ? ?INTERVAL HISTORY: ?[name] 71 y.o. male returns to the clinic today for annual follow-up visit.  The patient is feeling fine today with no concerning complaints except for cough and shortness of breath that has been going on for a while especially the last few months after seasonal allergy.  He is currently being treated by his primary care physician.  He denied having any chest pain or hemoptysis.  He has no recent weight loss or night sweats.  He has no nausea, vomiting, diarrhea or constipation.  He denied having any headache or visual changes.  The patient is here today for evaluation with repeat CT scan of the chest for restaging of his disease. ? ? ?MEDICAL HISTORY: ?Past Medical History:  ?Diagnosis Date  ? Adenocarcinoma of right lung, stage 1 ([facility]) 09/26/2016  ? Arthritis   ? Cancer [facility])   ? pt states possible lung cancer to right lung  ? COPD (chronic obstructive pulmonary disease) ([place])   ? Dyspnea   ? with exertion  ? Enlarged prostate   ? Head injury   ? a. remotely with cranial plate in place.  ? Headache   ? neck cramps   ? Lung mass   ? a. @ [facility] - 2.5 cm right upper lobe hypermetabolic mass highly suggestive of primary lung cancer.   ? Pneumonia   ? Tobacco abuse   ? ? ?ALLERGIES:  has No Known Allergies. ? ?MEDICATIONS:  ?Current Outpatient Medications  ?Medication Sig Dispense Refill  ? acetaminophen (EQL ARTHRITIS  PAIN RELIEF) 650 MG CR tablet Take 650 mg by mouth every 8 (eight) hours as needed for pain.    ? albuterol (PROVENTIL HFA;VENTOLIN HFA) 108 (90 Base) MCG/ACT inhaler Inhale 2 puffs into the lungs every 6 (six) hours as needed for wheezing or shortness of breath. 1 Inhaler 6  ? albuterol (PROVENTIL) (2.5 MG/3ML) 0.083% nebulizer solution Take 2.5 mg by nebulization every 6 (six) hours as needed for wheezing or shortness of breath.    ? aspirin EC 81 MG tablet Take 81 mg by mouth daily.    ? budesonide-formoterol (SYMBICORT) 160-4.5 MCG/ACT inhaler Inhale 2 puffs into the lungs 2 (two) times daily.    ? lisinopril (PRINIVIL,ZESTRIL) 10 MG tablet Take 10 mg by mouth daily.    ? Spacer/Aero Chamber Mouthpiece MISC 1 Device by Does not apply route 2 (two) times daily. 1 each 0  ? tamsulosin (FLOMAX) 0.4 MG CAPS capsule TAKE ONE CAPSULE BY MOUTH DAILY AFTER BREAKFAST 30 capsule 0  ? tiotropium (SPIRIVA) 18 MCG inhalation capsule Place 1 capsule (18 mcg total) into inhaler and inhale daily. 30 capsule 6  ? traMADol (ULTRAM) 50 MG tablet Take 1 tablet (50 mg total) by mouth every 12 (twelve) hours as needed for severe pain. 30 tablet 0  ? ?No current facility-administered medications for this visit.  ? ? ?SURGICAL HISTORY:  ?Past Surgical History:  ?Procedure Laterality Date  ? CARPAL TUNNEL RELEASE  Right   ? COLONOSCOPY    ? ELBOW SURGERY    ? surgery x2 on right elbow  ? EYE SURGERY    ? eye socket fracture, metal plate on left side of face  ? HAND SURGERY    ? multiple hand surgeries on right hand  ? LOBECTOMY Right 07/19/2016  ? Procedure: LOBECTOMY RIGHT UPPER LOBE WITH WEDGE RESECTION SUPERIOR SEGMENT RIGHT LOWER LOBE WITH PLACEMENT OF ON-Q;  Surgeon: [name], MD;  Location: [facility];  Service: Thoracic;  Laterality: Right;  ? LYMPH NODE DISSECTION Right 07/19/2016  ? Procedure: LYMPH NODE DISSECTION;  Surgeon: [name], MD;  Location: [facility];  Service: Thoracic;  Laterality: Right;  ? SKIN CANCER  EXCISION    ? SKIN GRAFT    ? to right hand   ? VIDEO ASSISTED THORACOSCOPY Right 08/04/2016  ? Procedure: RIGHT VIDEO ASSISTED THORACOSCOPY WITH SUTURE CLOSURE OF AIR LEAK;  Surgeon: [name], MD;  Location: [facility];  Service: Thoracic;  Laterality: Right;  ? VIDEO ASSISTED THORACOSCOPY (VATS)/WEDGE RESECTION Right 07/19/2016  ? Procedure: VIDEO ASSISTED THORACOSCOPY (VATS);  Surgeon: [name], MD;  Location: [facility];  Service: Thoracic;  Laterality: Right;  ? VIDEO BRONCHOSCOPY N/A 07/19/2016  ? Procedure: VIDEO BRONCHOSCOPY;  Surgeon: [name], MD;  Location: [facility];  Service: Thoracic;  Laterality: N/A;  ? VIDEO BRONCHOSCOPY N/A 09/28/2016  ? Procedure: VIDEO BRONCHOSCOPY, with general anesthesia for removal of interbronchial valves x 3;  Surgeon: [name], MD;  Location[facility];  Service: Thoracic;  Laterality: N/A;  ? VIDEO BRONCHOSCOPY WITH INSERTION OF INTERBRONCHIAL VALVE (IBV) Right 08/01/2016  ? Procedure: VIDEO BRONCHOSCOPY WITH INSERTION OF INTERBRONCHIAL VALVE (IBV);  Surgeon: [name], MD;  Location: [facility];  Service: Thoracic;  Laterality: Right;  ? WEDGE RESECTION Right 07/19/2016  ? Procedure: WEDGE  RESECTION RIGHT UPPER LOBE;  Surgeon: [name], MD;  Location: [facility];  Service: Thoracic;  Laterality: Right;  ? ? ?REVIEW OF SYSTEMS:  A comprehensive review of systems was negative except for: Respiratory: positive for cough and dyspnea on exertion  ? ?PHYSICAL EXAMINATION: General appearance: alert, cooperative, and no distress ?Head: Normocephalic, without obvious abnormality, atraumatic ?Neck: no adenopathy, no JVD, supple, symmetrical, trachea midline, and thyroid not enlarged, symmetric, no tenderness/mass/nodules ?Lymph nodes: Cervical, supraclavicular, and axillary nodes normal. ?Resp: clear to auscultation bilaterally ?Back: symmetric, no curvature. ROM normal. No CVA tenderness. ?Cardio: regular rate and rhythm, S1, S2 normal, no murmur, click, rub or  gallop ?GI: soft, non-tender; bowel sounds normal; no masses,  no organomegaly ?Extremities: extremities normal, atraumatic, no cyanosis or edema ? ?ECOG PERFORMANCE STATUS: 1 - Symptomatic but completely ambulatory ? ?Blood pressure 133/72, pulse 61, temperature (!) 97 ?F (36.1 ?C), temperature source Tympanic, resp. rate 19, height 6' (1.829 m), weight 146 lb 4.8 oz (66.4 kg), SpO2 96 %. ? ?LABORATORY DATA: ?Lab Results  ?Component Value Date  ? WBC 5.7 10/11/2021  ? HGB 14.8 10/11/2021  ? HCT 44.8 10/11/2021  ? MCV 95.7 10/11/2021  ? PLT 182 10/11/2021  ? ? ?  Chemistry   ?   ?Component Value Date/Time  ? NA 139 10/11/2021 0917  ? NA 137 03/27/2017 0944  ? K 4.9 10/11/2021 0917  ? K 4.3 03/27/2017 0944  ? CL 107 10/11/2021 0917  ? CO2 29 10/11/2021 0917  ? CO2 25 03/27/2017 0944  ? BUN 15 10/11/2021 0917  ? BUN 8.3 03/27/2017 0944  ?  CREATININE 0.89 10/11/2021 0917  ? CREATININE 0.9 03/27/2017 0944  ?    ?Component Value Date/Time  ? CALCIUM 9.1 10/11/2021 0917  ? CALCIUM 9.7 03/27/2017 0944  ? ALKPHOS 73 10/11/2021 0917  ? ALKPHOS 103 03/27/2017 0944  ? AST 14 (L) 10/11/2021 0917  ? AST 13 03/27/2017 0944  ? ALT 10 10/11/2021 0917  ? ALT <6 03/27/2017 0944  ? BILITOT 0.5 10/11/2021 0917  ? BILITOT 0.53 03/27/2017 0944  ?  ? ? ? ?RADIOGRAPHIC STUDIES: ?CT Chest W Contrast ? ?Result Date: 10/11/2021 ?CLINICAL DATA:  Non-small-cell lung cancer.  Restaging. EXAM: CT CHEST WITH CONTRAST TECHNIQUE: Multidetector CT imaging of the chest was performed during intravenous contrast administration. RADIATION DOSE REDUCTION: This exam was performed according to the departmental dose-optimization program which includes automated exposure control, adjustment of the mA and/or kV according to patient size and/or use of iterative reconstruction technique. CONTRAST:  40mL OMNIPAQUE IOHEXOL 300 MG/ML  SOLN COMPARISON:  10/08/2020 FINDINGS: Cardiovascular: The heart size is normal. No substantial pericardial effusion. Coronary artery  calcification is evident. Mild atherosclerotic calcification is noted in the wall of the thoracic aorta. Mediastinum/Nodes: No mediastinal lymphadenopathy. There is no hilar lymphadenopathy. The esophagus ha

## 2022-01-31 ENCOUNTER — Other Ambulatory Visit: Payer: Self-pay

## 2022-01-31 ENCOUNTER — Emergency Department (HOSPITAL_COMMUNITY)
Admission: EM | Admit: 2022-01-31 | Discharge: 2022-01-31 | Disposition: A | Discharge status: Home / Self Care | Payer: PPO | Attending: Emergency Medicine | Admitting: Emergency Medicine

## 2022-01-31 ENCOUNTER — Encounter (HOSPITAL_COMMUNITY): Payer: Self-pay

## 2022-01-31 DIAGNOSIS — R3915 Urgency of urination: Secondary | ICD-10-CM | POA: Diagnosis not present

## 2022-01-31 DIAGNOSIS — R339 Retention of urine, unspecified: Secondary | ICD-10-CM | POA: Insufficient documentation

## 2022-01-31 DIAGNOSIS — Z7982 Long term (current) use of aspirin: Secondary | ICD-10-CM | POA: Diagnosis not present

## 2022-01-31 DIAGNOSIS — R338 Other retention of urine: Secondary | ICD-10-CM

## 2022-01-31 LAB — COMPREHENSIVE METABOLIC PANEL
ALT: 13 U/L (ref 0–44)
AST: 19 U/L (ref 15–41)
Albumin: 4.1 g/dL (ref 3.5–5.0)
Alkaline Phosphatase: 72 U/L (ref 38–126)
Anion gap: 9 (ref 5–15)
BUN: 10 mg/dL (ref 8–23)
CO2: 24 mmol/L (ref 22–32)
Calcium: 9.3 mg/dL (ref 8.9–10.3)
Chloride: 106 mmol/L (ref 98–111)
Creatinine, Ser: 0.79 mg/dL (ref 0.61–1.24)
GFR, Estimated: >60 mL/min (ref >60)
Glucose, Bld: 122 mg/dL — ABNORMAL HIGH (ref 70–99)
Potassium: 4.1 mmol/L (ref 3.5–5.1)
Sodium: 139 mmol/L (ref 135–145)
Total Bilirubin: 0.6 mg/dL (ref 0.3–1.2)
Total Protein: 7 g/dL (ref 6.5–8.1)

## 2022-01-31 LAB — CBC WITH DIFFERENTIAL/PLATELET
Abs Immature Granulocytes: 0.03 10*3/uL (ref 0.00–0.07)
Basophils Absolute: 0 10*3/uL (ref 0.0–0.1)
Basophils Relative: 0 %
Eosinophils Absolute: 0 10*3/uL (ref 0.0–0.5)
Eosinophils Relative: 0 %
HCT: 45 % (ref 39.0–52.0)
Hemoglobin: 15.1 g/dL (ref 13.0–17.0)
Immature Granulocytes: 0 %
Lymphocytes Relative: 11 %
Lymphs Abs: 0.8 10*3/uL (ref 0.7–4.0)
MCH: 32.1 pg (ref 26.0–34.0)
MCHC: 33.6 g/dL (ref 30.0–36.0)
MCV: 95.7 fL (ref 80.0–100.0)
Monocytes Absolute: 0.5 10*3/uL (ref 0.1–1.0)
Monocytes Relative: 6 %
Neutro Abs: 6.3 10*3/uL (ref 1.7–7.7)
Neutrophils Relative %: 83 %
Platelets: 169 10*3/uL (ref 150–400)
RBC: 4.7 MIL/uL (ref 4.22–5.81)
RDW: 13.8 % (ref 11.5–15.5)
WBC: 7.7 10*3/uL (ref 4.0–10.5)
nRBC: 0 % (ref 0.0–0.2)

## 2022-01-31 LAB — URINALYSIS, ROUTINE W REFLEX MICROSCOPIC
Bacteria, UA: NONE SEEN
Bilirubin Urine: NEGATIVE
Glucose, UA: NEGATIVE mg/dL
Ketones, ur: NEGATIVE mg/dL
Leukocytes,Ua: NEGATIVE
Nitrite: NEGATIVE
Protein, ur: NEGATIVE mg/dL
Specific Gravity, Urine: 1.012 (ref 1.005–1.030)
pH: 5 (ref 5.0–8.0)

## 2022-01-31 NOTE — ED Provider Notes (Signed)
[facility] DEPT Provider Note   CSN: [id number] Arrival date & time: 01/31/22  1117     History {Add pertinent medical, surgical, social history, OB history to HPI:1} Chief Complaint  Patient presents with  . Urinary Retention    [name] is a 71 y.o. male who presents emergency department with chief complaint of urinary retention.  Patient had urgency to urinate and was unable to pass urine since yesterday morning.  Presented with greater than 600 mL of urine in his bladder and a Foley catheter was placed.  Patient states he has a history of this in the past due to what he thinks is BPH.  He takes Flomax but has no established relationship with a urologist and has not had the catheter keep kept in place.  He denies burning frequency urgency of urination, flank pain or fevers.  HPI     Home Medications Prior to Admission medications   Medication Sig Start Date End Date Taking? Authorizing Provider  acetaminophen (EQL ARTHRITIS PAIN RELIEF) 650 MG CR tablet Take 650 mg by mouth every 8 (eight) hours as needed for pain.    [provider]  albuterol (PROVENTIL HFA;VENTOLIN HFA) 108 (90 Base) MCG/ACT inhaler Inhale 2 puffs into the lungs every 6 (six) hours as needed for wheezing or shortness of breath. 01/24/18   Juanito Doom, MD  albuterol (PROVENTIL) (2.5 MG/3ML) 0.083% nebulizer solution Take 2.5 mg by nebulization every 6 (six) hours as needed for wheezing or shortness of breath.    [provider]  aspirin EC 81 MG tablet Take 81 mg by mouth daily.    [provider]  budesonide-formoterol (SYMBICORT) 160-4.5 MCG/ACT inhaler Inhale 2 puffs into the lungs 2 (two) times daily.    [provider]  lisinopril (PRINIVIL,ZESTRIL) 10 MG tablet Take 10 mg by mouth daily.    [provider]  Spacer/Aero Chamber Mouthpiece MISC 1 Device by Does not apply route 2 (two) times daily. 02/02/17   Javier Glazier, MD  tamsulosin (FLOMAX) 0.4 MG CAPS capsule TAKE ONE CAPSULE BY MOUTH DAILY AFTER BREAKFAST 11/24/16   Elgie Collard, PA-C  tiotropium (SPIRIVA) 18 MCG inhalation capsule Place 1 capsule (18 mcg total) into inhaler and inhale daily. 08/28/19   Martyn Ehrich, NP  traMADol (ULTRAM) 50 MG tablet Take 1 tablet (50 mg total) by mouth every 12 (twelve) hours as needed for severe pain. 10/12/16   Elgie Collard, PA-C      Allergies    Patient has no known allergies.    Review of Systems   Review of Systems  Physical Exam Updated Vital Signs BP (!) 126/96 (BP Location: Left Arm)   Pulse 87   Temp 98.3 F (36.8 C) (Oral)   Resp 16   Ht 6' (1.829 m)   Wt 64.4 kg   SpO2 99%   BMI 19.26 kg/m  Physical Exam Vitals and nursing note reviewed.  Constitutional:      General: He is not in acute distress.    Appearance: He is well-developed. He is not diaphoretic.  HENT:     Head: Normocephalic and atraumatic.  Eyes:     General: No scleral icterus.    Conjunctiva/sclera: Conjunctivae normal.  Cardiovascular:     Rate and Rhythm: Normal rate and regular rhythm.     Heart sounds: Normal heart sounds.  Pulmonary:     Effort: Pulmonary effort is normal. No respiratory distress.  Breath sounds: Normal breath sounds.  Abdominal:     Palpations: Abdomen is soft.     Tenderness: There is no abdominal tenderness.  Musculoskeletal:     Cervical back: Normal range of motion and neck supple.  Skin:    General: Skin is warm and dry.  Neurological:     Mental Status: He is alert.  Psychiatric:        Behavior: Behavior normal.    ED Results / Procedures / Treatments   Labs (all labs ordered are listed, but only abnormal results are displayed) Labs Reviewed  COMPREHENSIVE METABOLIC PANEL - Abnormal; Notable for the following components:      Result Value   Glucose, Bld 122 (*)    All other components within normal limits  URINE CULTURE  CBC WITH DIFFERENTIAL/PLATELET   URINALYSIS, ROUTINE W REFLEX MICROSCOPIC    EKG None  Radiology No results found.  Procedures Procedures  {Document cardiac monitor, telemetry assessment procedure when appropriate:1}  Medications Ordered in ED Medications - No data to display  ED Course/ Medical Decision Making/ A&P                           Medical Decision Making  ***  {Document critical care time when appropriate:1} {Document review of labs and clinical decision tools ie heart score, Chads2Vasc2 etc:1}  {Document your independent review of radiology images, and any outside records:1} {Document your discussion with family members, caretakers, and with consultants:1} {Document social determinants of health affecting pt's care:1} {Document your decision making why or why not admission, treatments were needed:1} Final Clinical Impression(s) / ED Diagnoses Final diagnoses:  None    Rx / DC Orders ED Discharge Orders     None

## 2022-01-31 NOTE — ED Provider Triage Note (Signed)
Emergency Medicine Provider Triage Evaluation Note  [name] , a 71 y.o. male  was evaluated in triage.  Pt complains of urinary retention.  Patient states that he has not been able to urinate since yesterday evening.  He is complaining of pressure and pain sensation in the suprapubic region.  He was able to urinate just a small amount here in triage for a urine sample.  He was bladder scanned in triage was 600 and his bladder.  He states that he has BPH and takes Flomax.  He has had acute urinary retention once previously after hospital stay he states that there was no determination as to why that happened previously..  Denies fevers, nausea or vomiting  Review of Systems  Positive: See above Negative:   Physical Exam  BP (!) 126/96 (BP Location: Left Arm)   Pulse 87   Temp 98.3 F (36.8 C) (Oral)   Resp 16   Ht 6' (1.829 m)   Wt 64.4 kg   SpO2 99%   BMI 19.26 kg/m  Gen:   Awake, no distress   Resp:  Normal effort  MSK:   Moves extremities without difficulty  Other:  Suprapubic tenderness  Medical Decision Making  Medically screening exam initiated at 12:23 PM.  Appropriate orders placed.  [name] was informed that the remainder of the evaluation will be completed by another provider, this initial triage assessment does not replace that evaluation, and the importance of remaining in the ED until their evaluation is complete.     Mickie Hillier, PA-C 01/31/22 1224

## 2022-01-31 NOTE — Discharge Instructions (Addendum)
I discussed her case with Dr. Jeffie Pollock of urology.  He has assured me that he will get you in later this week or early next week for a voiding trial.  Most likely will be during the week.  You can also double your Flomax instead of taking Flomax in the morning you can take it twice daily during this time to help improve your urinary output.  It does not appear that you have a urinary tract infection.

## 2022-01-31 NOTE — ED Notes (Signed)
Leg bag applied before pt discharged.

## 2022-01-31 NOTE — ED Notes (Signed)
Called lab to check on UA result. Lab will release results shortly.

## 2022-01-31 NOTE — ED Notes (Signed)
RN called lab about UA results. Per lab, they are working to get UA results into epic shortly.

## 2022-01-31 NOTE — ED Triage Notes (Signed)
Patient states he has not been able to urinate since yesterday AM. Patient did have a small amount while in triage for a specimen. Patient states a history of an enlarged Prostate and he also takes flomax.

## 2022-02-01 LAB — URINE CULTURE: Culture: NO GROWTH

## 2022-03-12 ENCOUNTER — Encounter (HOSPITAL_COMMUNITY): Payer: Self-pay

## 2022-03-12 ENCOUNTER — Emergency Department (HOSPITAL_COMMUNITY)
Admission: EM | Admit: 2022-03-12 | Discharge: 2022-03-12 | Disposition: A | Discharge status: Home / Self Care | Payer: PPO | Attending: Emergency Medicine | Admitting: Emergency Medicine

## 2022-03-12 ENCOUNTER — Other Ambulatory Visit: Payer: Self-pay

## 2022-03-12 DIAGNOSIS — R339 Retention of urine, unspecified: Secondary | ICD-10-CM | POA: Insufficient documentation

## 2022-03-12 DIAGNOSIS — R1030 Lower abdominal pain, unspecified: Secondary | ICD-10-CM | POA: Insufficient documentation

## 2022-03-12 NOTE — ED Provider Notes (Signed)
16FR coude placed by nursing   Ripley Fraise, MD 03/12/22 [phone]

## 2022-03-12 NOTE — ED Provider Notes (Signed)
  [name] DEPT Provider Note   CSN: [id number] Arrival date & time: 03/12/22  0232     History  Chief Complaint  Patient presents with   Urinary Retention    [name] is a 71 y.o. male.  The history is provided by the patient.  Patient presents for urinary retention.  He reports he just had a Foley catheter removed yesterday by his urologist.  However over the past several hours he had increasing urinary retention and lower abdominal pain. No fevers or vomiting.  He takes Flomax.  He is already on antibiotics.  He is scheduled to see urology next week     Home Medications Prior to Admission medications   Medication Sig Start Date End Date Taking? Authorizing Provider  acetaminophen (TYLENOL) 650 MG CR tablet Take 650 mg by mouth 2 (two) times daily.    [provider]  aspirin EC 81 MG tablet Take 81 mg by mouth every morning.    [provider]  budesonide-formoterol (SYMBICORT) 160-4.5 MCG/ACT inhaler Inhale 2 puffs into the lungs 2 (two) times daily as needed (shortness of breath/wheezing).    [provider]  melatonin 5 MG TABS Take 5 mg by mouth See admin instructions. Take one tablet (5 mg) by mouth two hours before bedtime, take one tablet (5 mg) at bedtime    [provider]  Spacer/Aero Chamber Mouthpiece MISC 1 Device by Does not apply route 2 (two) times daily. 02/02/17   Javier Glazier, MD  tamsulosin (FLOMAX) 0.4 MG CAPS capsule TAKE ONE CAPSULE BY MOUTH DAILY AFTER BREAKFAST Patient taking differently: Take 0.4 mg by mouth every morning. 11/24/16   Elgie Collard, PA-C      Allergies    Patient has no known allergies.    Review of Systems   Review of Systems  Genitourinary:  Positive for difficulty urinating.    Physical Exam Updated Vital Signs BP 128/84   Pulse 79   Temp 97.9 F (36.6 C)   Resp 16   SpO2 96%  Physical Exam CONSTITUTIONAL: Well developed/well nourished HEAD:  Normocephalic/atraumatic EYES: EOMI ABDOMEN: soft, nontender GU exam deferred NEURO: Pt is awake/alert/appropriate, moves all extremitiesx4.  No facial droop.   EXTREMITIES: full ROM SKIN: warm, color normal PSYCH: no abnormalities of mood noted, alert and oriented to situation  ED Results / Procedures / Treatments   Labs (all labs ordered are listed, but only abnormal results are displayed) Labs Reviewed - No data to display  EKG None  Radiology No results found.  Procedures Procedures    Medications Ordered in ED Medications - No data to display  ED Course/ Medical Decision Making/ A&P                           Medical Decision Making  By the time of my evaluation patient already had a catheter in place and felt much improved.  He is already on antibiotics.  He is already on Flomax. He will be placed on a leg bag and discharged home with follow-up with urology next week No other acute complaint        Final Clinical Impression(s) / ED Diagnoses Final diagnoses:  Urinary retention    Rx / DC Orders ED Discharge Orders     None         Ripley Fraise, MD 03/12/22 0345

## 2022-03-12 NOTE — ED Triage Notes (Signed)
Pt reports with urinary retention since Friday morning. Pt states that he had a catheter in place but it was removed yesterday.

## 2022-05-05 ENCOUNTER — Other Ambulatory Visit: Payer: Self-pay | Admitting: Urology

## 2022-06-06 NOTE — Patient Instructions (Signed)
SURGICAL WAITING ROOM VISITATION Patients having surgery or a procedure may have no more than 2 support people in the waiting area - these visitors may rotate.   Children under the age of 59 must have an adult with them who is not the patient. If the patient needs to stay at the hospital during part of their recovery, the visitor guidelines for inpatient rooms apply. Pre-op nurse will coordinate an appropriate time for 1 support person to accompany patient in pre-op.  This support person may not rotate.    Please refer to the [facility] website for the visitor guidelines for Inpatients (after your surgery is over and you are in a regular room).       Your procedure is scheduled on:  06/15/22    Report to [facility] Main Entrance    Report to admitting at   1030AM   Call this number if you have problems the morning of surgery [phone]    cLEAR LQIUID DIET THE DAY BEFORE SURGERY.               MAGNESIUM CITRATE - ONE BOTTLE BY 12 NOON DAY BEFORE SURGERY.               nO FOOD OR DRINK AFTER MIDNITE THE NITE BEFORE SURGERY.   Water Non-Citrus Juices (without pulp, NO RED) Carbonated Beverages Black Coffee (NO MILK/CREAM OR CREAMERS, sugar ok)  Clear Tea (NO MILK/CREAM OR CREAMERS, sugar ok) regular and decaf                             Plain Jell-O (NO RED)                                           Fruit ices (not with fruit pulp, NO RED)                                     Popsicles (NO RED)                                                               Sports drinks like Gatorade (NO RED)                            If you have questions, please contact your surgeon's office.   FOLLOW BOWEL PREP AND ANY ADDITIONAL PRE OP INSTRUCTIONS YOU RECEIVED FROM YOUR SURGEON'S OFFICE!!!     Oral Hygiene is also important to reduce your risk of infection.                                    Remember - BRUSH YOUR TEETH THE MORNING OF SURGERY WITH YOUR REGULAR  TOOTHPASTE  DENTURES WILL BE REMOVED PRIOR TO SURGERY PLEASE DO NOT APPLY "Poly grip" OR ADHESIVES!!!   Do NOT smoke after Midnight   Take these medicines the morning of surgery with A SIP OF WATER:  INHALERS AS  USUAL AND BRING, PROSCASR, RAPAFLO, FLOMAX   DO NOT TAKE ANY ORAL DIABETIC MEDICATIONS DAY OF YOUR SURGERY  Bring CPAP mask and tubing day of surgery.                              You may not have any metal on your body including hair pins, jewelry, and body piercing             Do not wear make-up, lotions, powders, perfumes/cologne, or deodorant  Do not wear nail polish including gel and S&S, artificial/acrylic nails, or any other type of covering on natural nails including finger and toenails. If you have artificial nails, gel coating, etc. that needs to be removed by a nail salon please have this removed prior to surgery or surgery may need to be canceled/ delayed if the surgeon/ anesthesia feels like they are unable to be safely monitored.   Do not shave  48 hours prior to surgery.               Men may shave face and neck.   Do not bring valuables to the hospital. [place].   Contacts, glasses, dentures or bridgework may not be worn into surgery.   Bring small overnight bag day of surgery.   DO NOT [place]. PHARMACY WILL DISPENSE MEDICATIONS LISTED ON YOUR MEDICATION LIST TO YOU DURING YOUR ADMISSION [place]!    Patients discharged on the day of surgery will not be allowed to drive home.  Someone NEEDS to stay with you for the first 24 hours after anesthesia.   Special Instructions: Bring a copy of your healthcare power of attorney and living will documents the day of surgery if you haven't scanned them before.              Please read over the following fact sheets you were given: IF [place] [phone]   If you  received a COVID test during your pre-op visit  it is requested that you wear a mask when out in public, stay away from anyone that may not be feeling well and notify your surgeon if you develop symptoms. If you test positive for Covid or have been in contact with anyone that has tested positive in the last 10 days please notify you surgeon.    [place] - Preparing for Surgery Before surgery, you can play an important role.  Because skin is not sterile, your skin needs to be as free of germs as possible.  You can reduce the number of germs on your skin by washing with CHG (chlorahexidine gluconate) soap before surgery.  CHG is an antiseptic cleaner which kills germs and bonds with the skin to continue killing germs even after washing. Please DO NOT use if you have an allergy to CHG or antibacterial soaps.  If your skin becomes reddened/irritated stop using the CHG and inform your nurse when you arrive at Short Stay. Do not shave (including legs and underarms) for at least 48 hours prior to the first CHG shower.  You may shave your face/neck. Please follow these instructions carefully:  1.  Shower with CHG Soap the night before surgery and the  morning of Surgery.  2.  If you choose to wash  your hair, wash your hair first as usual with your  normal  shampoo.  3.  After you shampoo, rinse your hair and body thoroughly to remove the  shampoo.                           4.  Use CHG as you would any other liquid soap.  You can apply chg directly  to the skin and wash                       Gently with a scrungie or clean washcloth.  5.  Apply the CHG Soap to your body ONLY FROM THE NECK DOWN.   Do not use on face/ open                           Wound or open sores. Avoid contact with eyes, ears mouth and genitals (private parts).                       Wash face,  Genitals (private parts) with your normal soap.             6.  Wash thoroughly, paying special attention to the area where your surgery  will be  performed.  7.  Thoroughly rinse your body with warm water from the neck down.  8.  DO NOT shower/wash with your normal soap after using and rinsing off  the CHG Soap.                9.  Pat yourself dry with a clean towel.            10.  Wear clean pajamas.            11.  Place clean sheets on your bed the night of your first shower and do not  sleep with pets. Day of Surgery : Do not apply any lotions/deodorants the morning of surgery.  Please wear clean clothes to the hospital/surgery center.  FAILURE TO FOLLOW THESE INSTRUCTIONS MAY RESULT IN THE CANCELLATION OF YOUR SURGERY PATIENT SIGNATURE_________________________________  NURSE SIGNATURE__________________________________  ________________________________________________________________________

## 2022-06-06 NOTE — Progress Notes (Addendum)
Anesthesia Review:  PCP: DR Sandi Mariscal  Called and [facility] for [facility] and requested any clearance if available.   Cardiologist : none  Not followed by pulmonary md  Chest x-ray : CT chest- 10/11/21  EKG : 06/08/22  Echo : Stress test 09-22-2016  Cardiac Cath :  Activity level:  can do a flgiht of stairs without difficulty  Sleep Study/ CPAP : none  Fasting Blood Sugar :      / Checks Blood Sugar -- times a day:   Blood Thinner/ Instructions /Last Dose: ASA / Instructions/ Last Dose :  Prediabetes - does not check gluocse at home  Hgba1c-  06/08/22 - 5.8   81 mg aspirin  Pharmacy in to reconcile meds at preop appt.    PT with hx of lung cancer and surgeries x 4 per pt related to lung cancer.  Per DR Servando Snare per pt- pt woke up during surgery.  PT diagnosed in September 22, 2016 with lung cancer.   Not followed by pulmonary MD.  Wife died in 09/22/17 with lung cancer.   PT currently has catheter in place.

## 2022-06-08 ENCOUNTER — Encounter (HOSPITAL_COMMUNITY)
Admission: RE | Admit: 2022-06-08 | Discharge: 2022-06-08 | Disposition: A | Discharge status: Home / Self Care | Payer: PPO | Source: Ambulatory Visit | Attending: Urology | Admitting: Urology

## 2022-06-08 ENCOUNTER — Encounter (HOSPITAL_COMMUNITY): Payer: Self-pay

## 2022-06-08 ENCOUNTER — Other Ambulatory Visit: Payer: Self-pay

## 2022-06-08 VITALS — BP 124/71 | HR 60 | Temp 98.8°F | Resp 16 | Ht 72.0 in | Wt 140.0 lb

## 2022-06-08 DIAGNOSIS — R9431 Abnormal electrocardiogram [ECG] [EKG]: Secondary | ICD-10-CM | POA: Diagnosis not present

## 2022-06-08 DIAGNOSIS — Z01818 Encounter for other preprocedural examination: Secondary | ICD-10-CM | POA: Insufficient documentation

## 2022-06-08 HISTORY — DX: Cardiac arrhythmia, unspecified: I49.9

## 2022-06-08 HISTORY — DX: Prediabetes: R73.03

## 2022-06-08 HISTORY — DX: Other complications of anesthesia, initial encounter: T88.59XA

## 2022-06-08 LAB — CBC
HCT: 47.4 % (ref 39.0–52.0)
Hemoglobin: 15.2 g/dL (ref 13.0–17.0)
MCH: 30.5 pg (ref 26.0–34.0)
MCHC: 32.1 g/dL (ref 30.0–36.0)
MCV: 95.2 fL (ref 80.0–100.0)
Platelets: 196 10*3/uL (ref 150–400)
RBC: 4.98 MIL/uL (ref 4.22–5.81)
RDW: 14.6 % (ref 11.5–15.5)
WBC: 7.8 10*3/uL (ref 4.0–10.5)
nRBC: 0 % (ref 0.0–0.2)

## 2022-06-08 LAB — BASIC METABOLIC PANEL
Anion gap: 8 (ref 5–15)
BUN: 14 mg/dL (ref 8–23)
CO2: 26 mmol/L (ref 22–32)
Calcium: 9.5 mg/dL (ref 8.9–10.3)
Chloride: 105 mmol/L (ref 98–111)
Creatinine, Ser: 0.85 mg/dL (ref 0.61–1.24)
GFR, Estimated: >60 mL/min (ref >60)
Glucose, Bld: 100 mg/dL — ABNORMAL HIGH (ref 70–99)
Potassium: 5 mmol/L (ref 3.5–5.1)
Sodium: 139 mmol/L (ref 135–145)

## 2022-06-08 LAB — HEMOGLOBIN A1C
Hgb A1c MFr Bld: 5.8 % — ABNORMAL HIGH (ref 4.8–5.6)
Mean Plasma Glucose: 120 mg/dL

## 2022-06-08 LAB — GLUCOSE, CAPILLARY: Glucose-Capillary: 93 mg/dL (ref 70–99)

## 2022-06-15 ENCOUNTER — Encounter (HOSPITAL_COMMUNITY): Payer: Self-pay | Admitting: Urology

## 2022-06-15 ENCOUNTER — Ambulatory Visit (HOSPITAL_BASED_OUTPATIENT_CLINIC_OR_DEPARTMENT_OTHER): Payer: PPO | Admitting: Certified Registered"

## 2022-06-15 ENCOUNTER — Encounter (HOSPITAL_COMMUNITY)
Admission: RE | Disposition: A | Discharge status: Home / Self Care | Payer: Self-pay | Source: Ambulatory Visit | Attending: Urology

## 2022-06-15 ENCOUNTER — Ambulatory Visit (HOSPITAL_COMMUNITY): Payer: PPO | Admitting: Physician Assistant

## 2022-06-15 ENCOUNTER — Observation Stay (HOSPITAL_COMMUNITY)
Admission: RE | Admit: 2022-06-15 | Discharge: 2022-06-17 | Disposition: A | Discharge status: Home / Self Care | Payer: PPO | Source: Ambulatory Visit | Attending: Urology | Admitting: Urology

## 2022-06-15 ENCOUNTER — Other Ambulatory Visit: Payer: Self-pay

## 2022-06-15 DIAGNOSIS — N138 Other obstructive and reflux uropathy: Secondary | ICD-10-CM | POA: Diagnosis present

## 2022-06-15 DIAGNOSIS — R8271 Bacteriuria: Secondary | ICD-10-CM | POA: Insufficient documentation

## 2022-06-15 DIAGNOSIS — R338 Other retention of urine: Secondary | ICD-10-CM | POA: Diagnosis not present

## 2022-06-15 DIAGNOSIS — J449 Chronic obstructive pulmonary disease, unspecified: Secondary | ICD-10-CM

## 2022-06-15 DIAGNOSIS — C3491 Malignant neoplasm of unspecified part of right bronchus or lung: Secondary | ICD-10-CM

## 2022-06-15 DIAGNOSIS — Z01818 Encounter for other preprocedural examination: Secondary | ICD-10-CM

## 2022-06-15 DIAGNOSIS — R339 Retention of urine, unspecified: Secondary | ICD-10-CM | POA: Insufficient documentation

## 2022-06-15 DIAGNOSIS — Z23 Encounter for immunization: Secondary | ICD-10-CM | POA: Diagnosis not present

## 2022-06-15 DIAGNOSIS — N401 Enlarged prostate with lower urinary tract symptoms: Principal | ICD-10-CM | POA: Insufficient documentation

## 2022-06-15 DIAGNOSIS — Z419 Encounter for procedure for purposes other than remedying health state, unspecified: Secondary | ICD-10-CM

## 2022-06-15 DIAGNOSIS — Z902 Acquired absence of lung [part of]: Secondary | ICD-10-CM

## 2022-06-15 DIAGNOSIS — Z85118 Personal history of other malignant neoplasm of bronchus and lung: Secondary | ICD-10-CM | POA: Insufficient documentation

## 2022-06-15 DIAGNOSIS — J929 Pleural plaque without asbestos: Secondary | ICD-10-CM

## 2022-06-15 DIAGNOSIS — Z77090 Contact with and (suspected) exposure to asbestos: Secondary | ICD-10-CM

## 2022-06-15 DIAGNOSIS — Z87891 Personal history of nicotine dependence: Secondary | ICD-10-CM | POA: Insufficient documentation

## 2022-06-15 DIAGNOSIS — C3490 Malignant neoplasm of unspecified part of unspecified bronchus or lung: Secondary | ICD-10-CM

## 2022-06-15 DIAGNOSIS — M1990 Unspecified osteoarthritis, unspecified site: Secondary | ICD-10-CM

## 2022-06-15 HISTORY — PX: XI ROBOTIC ASSISTED SIMPLE PROSTATECTOMY: SHX6713

## 2022-06-15 LAB — TYPE AND SCREEN
ABO/RH(D): O POS
Antibody Screen: NEGATIVE

## 2022-06-15 LAB — HEMOGLOBIN AND HEMATOCRIT, BLOOD
HCT: 41.8 % (ref 39.0–52.0)
Hemoglobin: 13.6 g/dL (ref 13.0–17.0)

## 2022-06-15 LAB — GLUCOSE, CAPILLARY: Glucose-Capillary: 97 mg/dL (ref 70–99)

## 2022-06-15 SURGERY — PROSTATECTOMY, SIMPLE, ROBOT-ASSISTED
Anesthesia: General | Site: Abdomen

## 2022-06-15 MED ORDER — GLYCOPYRROLATE 0.2 MG/ML IJ SOLN
INTRAMUSCULAR | Status: DC | PRN
  Administered 2022-06-15 (×2): .1 mg via INTRAVENOUS

## 2022-06-15 MED ORDER — ORAL CARE MOUTH RINSE: 15.0000 mL | Freq: Once | OROMUCOSAL | Status: AC

## 2022-06-15 MED ORDER — ACETAMINOPHEN 500 MG PO TABS
1000.0000 mg | ORAL_TABLET | Freq: Four times a day (QID) | ORAL | Status: AC
  Administered 2022-06-15 – 2022-06-16 (×4): 1000 mg via ORAL
  Filled 2022-06-15 (×4): qty 2

## 2022-06-15 MED ORDER — ONDANSETRON HCL 4 MG/2ML IJ SOLN: 4.0000 mg | INTRAMUSCULAR | Status: DC | PRN

## 2022-06-15 MED ORDER — SENNA 8.6 MG PO TABS
1.0000 | ORAL_TABLET | Freq: Two times a day (BID) | ORAL | Status: DC
  Administered 2022-06-15 – 2022-06-17 (×4): 8.6 mg via ORAL
  Filled 2022-06-15 (×4): qty 1

## 2022-06-15 MED ORDER — MIDAZOLAM HCL 2 MG/2ML IJ SOLN
INTRAMUSCULAR | Status: AC
  Filled 2022-06-15: qty 2

## 2022-06-15 MED ORDER — OXYBUTYNIN CHLORIDE 5 MG PO TABS
5.0000 mg | ORAL_TABLET | Freq: Three times a day (TID) | ORAL | Status: DC | PRN
  Administered 2022-06-16 – 2022-06-17 (×3): 5 mg via ORAL
  Filled 2022-06-15 (×4): qty 1

## 2022-06-15 MED ORDER — LACTATED RINGERS IV SOLN: INTRAVENOUS | Status: DC | PRN

## 2022-06-15 MED ORDER — PROPOFOL 10 MG/ML IV BOLUS
INTRAVENOUS | Status: DC | PRN
  Administered 2022-06-15: 140 mg via INTRAVENOUS

## 2022-06-15 MED ORDER — DOCUSATE SODIUM 100 MG PO CAPS
100.0000 mg | ORAL_CAPSULE | Freq: Two times a day (BID) | ORAL | Status: DC
  Administered 2022-06-15 – 2022-06-17 (×4): 100 mg via ORAL
  Filled 2022-06-15 (×4): qty 1

## 2022-06-15 MED ORDER — BUPIVACAINE LIPOSOME 1.3 % IJ SUSP
INTRAMUSCULAR | Status: DC | PRN
  Administered 2022-06-15: 20 mL

## 2022-06-15 MED ORDER — STERILE WATER FOR IRRIGATION IR SOLN
Status: DC | PRN
  Administered 2022-06-15: 1000 mL

## 2022-06-15 MED ORDER — ROCURONIUM BROMIDE 10 MG/ML (PF) SYRINGE
PREFILLED_SYRINGE | INTRAVENOUS | Status: AC
  Filled 2022-06-15: qty 10

## 2022-06-15 MED ORDER — SULFAMETHOXAZOLE-TRIMETHOPRIM 800-160 MG PO TABS: 1.0000 | ORAL_TABLET | Freq: Two times a day (BID) | ORAL | 0 refills | Status: AC

## 2022-06-15 MED ORDER — LIDOCAINE 2% (20 MG/ML) 5 ML SYRINGE
INTRAMUSCULAR | Status: DC | PRN
  Administered 2022-06-15: 40 mg via INTRAVENOUS

## 2022-06-15 MED ORDER — PNEUMOCOCCAL 20-VAL CONJ VACC 0.5 ML IM SUSY
0.5000 mL | PREFILLED_SYRINGE | INTRAMUSCULAR | Status: AC
  Administered 2022-06-16: 0.5 mL via INTRAMUSCULAR
  Filled 2022-06-15: qty 0.5

## 2022-06-15 MED ORDER — OXYCODONE HCL 5 MG/5ML PO SOLN: 5.0000 mg | Freq: Once | ORAL | Status: DC | PRN

## 2022-06-15 MED ORDER — TRAMADOL HCL 50 MG PO TABS: 50.0000 mg | ORAL_TABLET | Freq: Four times a day (QID) | ORAL | 0 refills | Status: DC | PRN

## 2022-06-15 MED ORDER — ONDANSETRON HCL 4 MG/2ML IJ SOLN: 4.0000 mg | Freq: Four times a day (QID) | INTRAMUSCULAR | Status: DC | PRN

## 2022-06-15 MED ORDER — BUPIVACAINE LIPOSOME 1.3 % IJ SUSP
INTRAMUSCULAR | Status: AC
  Filled 2022-06-15: qty 20

## 2022-06-15 MED ORDER — SUGAMMADEX SODIUM 200 MG/2ML IV SOLN
INTRAVENOUS | Status: DC | PRN
  Administered 2022-06-15: 200 mg via INTRAVENOUS

## 2022-06-15 MED ORDER — MORPHINE SULFATE (PF) 2 MG/ML IV SOLN
2.0000 mg | INTRAVENOUS | Status: DC | PRN
  Administered 2022-06-15: 4 mg via INTRAVENOUS
  Administered 2022-06-15: 2 mg via INTRAVENOUS
  Administered 2022-06-16: 4 mg via INTRAVENOUS
  Administered 2022-06-16: 2 mg via INTRAVENOUS
  Administered 2022-06-16: 4 mg via INTRAVENOUS
  Administered 2022-06-17: 2 mg via INTRAVENOUS
  Filled 2022-06-15: qty 2
  Filled 2022-06-15: qty 1
  Filled 2022-06-15 (×2): qty 2
  Filled 2022-06-15 (×2): qty 1

## 2022-06-15 MED ORDER — LABETALOL HCL 5 MG/ML IV SOLN
INTRAVENOUS | Status: AC
  Filled 2022-06-15: qty 4

## 2022-06-15 MED ORDER — ONDANSETRON HCL 4 MG/2ML IJ SOLN
INTRAMUSCULAR | Status: DC | PRN
  Administered 2022-06-15: 4 mg via INTRAVENOUS

## 2022-06-15 MED ORDER — INFLUENZA VAC A&B SA ADJ QUAD 0.5 ML IM PRSY
0.5000 mL | PREFILLED_SYRINGE | INTRAMUSCULAR | Status: AC
  Administered 2022-06-16: 0.5 mL via INTRAMUSCULAR
  Filled 2022-06-15: qty 0.5

## 2022-06-15 MED ORDER — FENTANYL CITRATE PF 50 MCG/ML IJ SOSY
PREFILLED_SYRINGE | INTRAMUSCULAR | Status: AC
  Filled 2022-06-15: qty 1

## 2022-06-15 MED ORDER — GLYCOPYRROLATE 0.2 MG/ML IJ SOLN
INTRAMUSCULAR | Status: AC
  Filled 2022-06-15: qty 1

## 2022-06-15 MED ORDER — ROCURONIUM BROMIDE 10 MG/ML (PF) SYRINGE
PREFILLED_SYRINGE | INTRAVENOUS | Status: DC | PRN
  Administered 2022-06-15: 20 mg via INTRAVENOUS
  Administered 2022-06-15: 60 mg via INTRAVENOUS
  Administered 2022-06-15: 20 mg via INTRAVENOUS

## 2022-06-15 MED ORDER — SODIUM CHLORIDE 0.9% FLUSH: 3.0000 mL | INTRAVENOUS | Status: DC | PRN

## 2022-06-15 MED ORDER — MIDAZOLAM HCL 2 MG/2ML IJ SOLN
INTRAMUSCULAR | Status: DC | PRN
  Administered 2022-06-15 (×2): 1 mg via INTRAVENOUS

## 2022-06-15 MED ORDER — EPHEDRINE 5 MG/ML INJ
INTRAVENOUS | Status: AC
  Filled 2022-06-15: qty 5

## 2022-06-15 MED ORDER — FENTANYL CITRATE (PF) 100 MCG/2ML IJ SOLN
INTRAMUSCULAR | Status: AC
  Filled 2022-06-15: qty 2

## 2022-06-15 MED ORDER — SODIUM CHLORIDE (PF) 0.9 % IJ SOLN
INTRAMUSCULAR | Status: AC
  Filled 2022-06-15: qty 20

## 2022-06-15 MED ORDER — PROPOFOL 10 MG/ML IV BOLUS
INTRAVENOUS | Status: AC
  Filled 2022-06-15: qty 20

## 2022-06-15 MED ORDER — LIDOCAINE HCL (PF) 2 % IJ SOLN
INTRAMUSCULAR | Status: AC
  Filled 2022-06-15: qty 5

## 2022-06-15 MED ORDER — ONDANSETRON HCL 4 MG/2ML IJ SOLN
INTRAMUSCULAR | Status: AC
  Filled 2022-06-15: qty 2

## 2022-06-15 MED ORDER — SODIUM CHLORIDE 0.9% FLUSH
3.0000 mL | Freq: Two times a day (BID) | INTRAVENOUS | Status: DC
  Administered 2022-06-16 (×2): 3 mL via INTRAVENOUS

## 2022-06-15 MED ORDER — LACTATED RINGERS IV SOLN: INTRAVENOUS | Status: DC

## 2022-06-15 MED ORDER — FENTANYL CITRATE PF 50 MCG/ML IJ SOSY
25.0000 ug | PREFILLED_SYRINGE | INTRAMUSCULAR | Status: DC | PRN
  Administered 2022-06-15 (×3): 50 ug via INTRAVENOUS

## 2022-06-15 MED ORDER — EPHEDRINE SULFATE-NACL 50-0.9 MG/10ML-% IV SOSY
PREFILLED_SYRINGE | INTRAVENOUS | Status: DC | PRN
  Administered 2022-06-15: 10 mg via INTRAVENOUS

## 2022-06-15 MED ORDER — PHENYLEPHRINE HCL-NACL 20-0.9 MG/250ML-% IV SOLN
INTRAVENOUS | Status: DC | PRN
  Administered 2022-06-15: 40 ug/min via INTRAVENOUS
  Administered 2022-06-15: 20 ug/min via INTRAVENOUS

## 2022-06-15 MED ORDER — DEXAMETHASONE SODIUM PHOSPHATE 10 MG/ML IJ SOLN
INTRAMUSCULAR | Status: DC | PRN
  Administered 2022-06-15: 4 mg via INTRAVENOUS

## 2022-06-15 MED ORDER — OXYCODONE HCL 5 MG PO TABS: 5.0000 mg | ORAL_TABLET | Freq: Once | ORAL | Status: DC | PRN

## 2022-06-15 MED ORDER — SODIUM CHLORIDE (PF) 0.9 % IJ SOLN
INTRAMUSCULAR | Status: DC | PRN
  Administered 2022-06-15: 20 mL

## 2022-06-15 MED ORDER — SODIUM CHLORIDE 0.9 % IV SOLN: 250.0000 mL | INTRAVENOUS | Status: DC | PRN

## 2022-06-15 MED ORDER — FENTANYL CITRATE (PF) 100 MCG/2ML IJ SOLN
INTRAMUSCULAR | Status: DC | PRN
  Administered 2022-06-15: 100 ug via INTRAVENOUS
  Administered 2022-06-15: 50 ug via INTRAVENOUS

## 2022-06-15 MED ORDER — FENTANYL CITRATE PF 50 MCG/ML IJ SOSY
PREFILLED_SYRINGE | INTRAMUSCULAR | Status: AC
  Filled 2022-06-15: qty 2

## 2022-06-15 MED ORDER — OXYCODONE HCL 5 MG PO TABS
5.0000 mg | ORAL_TABLET | ORAL | Status: DC | PRN
  Administered 2022-06-16 – 2022-06-17 (×4): 5 mg via ORAL
  Filled 2022-06-15 (×5): qty 1

## 2022-06-15 MED ORDER — CHLORHEXIDINE GLUCONATE 0.12 % MT SOLN
15.0000 mL | Freq: Once | OROMUCOSAL | Status: AC
  Administered 2022-06-15: 15 mL via OROMUCOSAL

## 2022-06-15 MED ORDER — MAGNESIUM CITRATE PO SOLN
1.0000 | Freq: Once | ORAL | Status: DC
  Filled 2022-06-15: qty 296

## 2022-06-15 MED ORDER — DEXAMETHASONE SODIUM PHOSPHATE 10 MG/ML IJ SOLN
INTRAMUSCULAR | Status: AC
  Filled 2022-06-15: qty 1

## 2022-06-15 MED ORDER — LACTATED RINGERS IR SOLN
Status: DC | PRN
  Administered 2022-06-15: 1000 mL

## 2022-06-15 MED ORDER — SODIUM CHLORIDE 0.9 % IV SOLN: INTRAVENOUS | Status: DC

## 2022-06-15 MED ORDER — CEFAZOLIN SODIUM-DEXTROSE 2-4 GM/100ML-% IV SOLN
2.0000 g | INTRAVENOUS | Status: AC
  Administered 2022-06-15: 2 g via INTRAVENOUS
  Filled 2022-06-15: qty 100

## 2022-06-15 SURGICAL SUPPLY — 70 items
ADH SKN CLS APL DERMABOND .7 (GAUZE/BANDAGES/DRESSINGS) ×1
APL PRP STRL LF DISP 70% ISPRP (MISCELLANEOUS) ×1
APL SWBSTK 6 STRL LF DISP (MISCELLANEOUS) ×1
APPLICATOR COTTON TIP 6 STRL (MISCELLANEOUS) ×1 IMPLANT
APPLICATOR COTTON TIP 6IN STRL (MISCELLANEOUS) ×1
BAG COUNTER SPONGE SURGICOUNT (BAG) IMPLANT
BAG SPNG CNTER NS LX DISP (BAG)
CATH FOLEY 2WAY SLVR 18FR 30CC (CATHETERS) ×1 IMPLANT
CATH FOLEY 3WAY 30CC 24FR (CATHETERS) ×1
CATH TIEMANN FOLEY 18FR 5CC (CATHETERS) IMPLANT
CATH URTH STD 24FR FL 3W 2 (CATHETERS) ×1 IMPLANT
CHLORAPREP W/TINT 26 (MISCELLANEOUS) ×1 IMPLANT
CLIP LIGATING HEM O LOK PURPLE (MISCELLANEOUS) IMPLANT
CLOTH BEACON ORANGE TIMEOUT ST (SAFETY) ×1 IMPLANT
COVER SURGICAL LIGHT HANDLE (MISCELLANEOUS) ×1 IMPLANT
COVER TIP SHEARS 8 DVNC (MISCELLANEOUS) ×1 IMPLANT
COVER TIP SHEARS 8MM DA VINCI (MISCELLANEOUS) ×1
CUTTER ECHEON FLEX ENDO 45 340 (ENDOMECHANICALS) IMPLANT
DERMABOND ADVANCED .7 DNX12 (GAUZE/BANDAGES/DRESSINGS) ×1 IMPLANT
DRAIN CHANNEL RND F F (WOUND CARE) IMPLANT
DRAPE ARM DVNC X/XI (DISPOSABLE) ×4 IMPLANT
DRAPE COLUMN DVNC XI (DISPOSABLE) ×1 IMPLANT
DRAPE DA VINCI XI ARM (DISPOSABLE) ×4
DRAPE DA VINCI XI COLUMN (DISPOSABLE) ×1
DRAPE SURG IRRIG POUCH 19X23 (DRAPES) ×1 IMPLANT
DRSG TEGADERM 4X4.75 (GAUZE/BANDAGES/DRESSINGS) ×1 IMPLANT
ELECT PENCIL ROCKER SW 15FT (MISCELLANEOUS) ×1 IMPLANT
ELECT REM PT RETURN 15FT ADLT (MISCELLANEOUS) ×1 IMPLANT
GAUZE SPONGE 4X4 12PLY STRL (GAUZE/BANDAGES/DRESSINGS) ×1 IMPLANT
GLOVE BIO SURGEON STRL SZ 6.5 (GLOVE) ×1 IMPLANT
GLOVE BIOGEL PI IND STRL 7.5 (GLOVE) ×1 IMPLANT
GLOVE SURG LX STRL 7.5 STRW (GLOVE) ×2 IMPLANT
GOWN SRG XL LVL 4 BRTHBL STRL (GOWNS) ×1 IMPLANT
GOWN STRL NON-REIN XL LVL4 (GOWNS) ×1
GOWN STRL REUS W/ TWL XL LVL3 (GOWN DISPOSABLE) ×2 IMPLANT
GOWN STRL REUS W/TWL XL LVL3 (GOWN DISPOSABLE) ×2
HOLDER FOLEY CATH W/STRAP (MISCELLANEOUS) ×1 IMPLANT
IRRIG SUCT STRYKERFLOW 2 WTIP (MISCELLANEOUS) ×1
IRRIGATION SUCT STRKRFLW 2 WTP (MISCELLANEOUS) ×1 IMPLANT
IV LACTATED RINGERS 1000ML (IV SOLUTION) ×1 IMPLANT
KIT TURNOVER KIT A (KITS) IMPLANT
NDL INSUFFLATION 14GA 120MM (NEEDLE) ×1 IMPLANT
NEEDLE INSUFFLATION 14GA 120MM (NEEDLE) ×1 IMPLANT
PACK ROBOT UROLOGY CUSTOM (CUSTOM PROCEDURE TRAY) ×1 IMPLANT
PAD POSITIONING PINK XL (MISCELLANEOUS) ×1 IMPLANT
PORT ACCESS TROCAR AIRSEAL 12 (TROCAR) ×1 IMPLANT
RELOAD STAPLE 45 4.1 GRN THCK (STAPLE) IMPLANT
SEAL CANN UNIV 5-8 DVNC XI (MISCELLANEOUS) ×4 IMPLANT
SEAL XI 5MM-8MM UNIVERSAL (MISCELLANEOUS) ×4
SET TRI-LUMEN FLTR TB AIRSEAL (TUBING) ×1 IMPLANT
SOLUTION ELECTROLUBE (MISCELLANEOUS) ×1 IMPLANT
SPIKE FLUID TRANSFER (MISCELLANEOUS) ×1 IMPLANT
SPONGE T-LAP 4X18 ~~LOC~~+RFID (SPONGE) IMPLANT
STAPLE RELOAD 45 GRN (STAPLE) ×1 IMPLANT
STAPLE RELOAD 45MM GREEN (STAPLE) ×1
SUT ETHILON 3 0 PS 1 (SUTURE) ×1 IMPLANT
SUT MNCRL AB 4-0 PS2 18 (SUTURE) ×2 IMPLANT
SUT PDS AB 1 CT1 27 (SUTURE) ×2 IMPLANT
SUT VIC AB 0 CT1 27 (SUTURE) ×3
SUT VIC AB 0 CT1 27XBRD ANTBC (SUTURE) ×3 IMPLANT
SUT VIC AB 2-0 SH 27 (SUTURE) ×2
SUT VIC AB 2-0 SH 27X BRD (SUTURE) ×1 IMPLANT
SUT VICRYL 0 UR6 27IN ABS (SUTURE) ×1 IMPLANT
SUT VLOC 180 2-0 9IN GS21 (SUTURE) ×2 IMPLANT
SUT VLOC 3-0 9IN GRN (SUTURE) ×2 IMPLANT
SYS BAG RETRIEVAL 10MM (BASKET) ×1
SYSTEM BAG RETRIEVAL 10MM (BASKET) ×1 IMPLANT
TROCAR Z-THREAD FIOS 5X100MM (TROCAR) IMPLANT
TROCAR Z-THREAD OPTICAL 5X100M (TROCAR) IMPLANT
WATER STERILE IRR 1000ML POUR (IV SOLUTION) ×1 IMPLANT

## 2022-06-15 NOTE — Anesthesia Preprocedure Evaluation (Signed)
Anesthesia Evaluation  Patient identified by MRN, date of birth, ID band Patient awake    Reviewed: Allergy & Precautions, H&P , NPO status , Patient's Chart, lab work & pertinent test results  Airway Mallampati: II   Neck ROM: full    Dental   Pulmonary shortness of breath, COPD, former smoker H/o lung CA. Lobectomy.   breath sounds clear to auscultation       Cardiovascular + dysrhythmias  Rhythm:regular Rate:Normal     Neuro/Psych    GI/Hepatic   Endo/Other    Renal/GU      Musculoskeletal  (+) Arthritis ,    Abdominal   Peds  Hematology   Anesthesia Other Findings   Reproductive/Obstetrics                             Anesthesia Physical Anesthesia Plan  ASA: 3  Anesthesia Plan: General   Post-op Pain Management:    Induction: Intravenous  PONV Risk Score and Plan: 2 and Ondansetron, Dexamethasone, Midazolam and Treatment may vary due to age or medical condition  Airway Management Planned: Oral ETT  Additional Equipment:   Intra-op Plan:   Post-operative Plan: Extubation in OR  Informed Consent: I have reviewed the patients History and Physical, chart, labs and discussed the procedure including the risks, benefits and alternatives for the proposed anesthesia with the patient or authorized representative who has indicated his/her understanding and acceptance.     Dental advisory given  Plan Discussed with: CRNA, Anesthesiologist and Surgeon  Anesthesia Plan Comments:        Anesthesia Quick Evaluation

## 2022-06-15 NOTE — Brief Op Note (Signed)
06/15/2022  3:40 PM  PATIENT:  [name]  71 y.o. male  PRE-OPERATIVE DIAGNOSIS:  MASSIVE PROSTATE WITH URINARY RETENTION  POST-OPERATIVE DIAGNOSIS:  MASSIVE PROSTATE WITH URINARY RETENTION  PROCEDURE:  Procedure(s) with comments: XI ROBOTIC ASSISTED SIMPLE PROSTATECTOMY (N/A) - 3 HRS  SURGEON:  Surgeon(s) and Role:    Alexis Frock, MD - Primary  PHYSICIAN ASSISTANT:   ASSISTANTS: Debbrah Alar PA  ANESTHESIA:   local and general  EBL:  70 mL   BLOOD ADMINISTERED:none  DRAINS:  1- JP to bulb; 2 - Foley to graviry    LOCAL MEDICATIONS USED:  MARCAINE     SPECIMEN:  Source of Specimen:  prostate adenoma  DISPOSITION OF SPECIMEN:  PATHOLOGY  COUNTS:  YES  TOURNIQUET:  * No tourniquets in log *  DICTATION: .Other Dictation: Dictation Number [id number]  PLAN OF CARE: Admit for overnight observation  PATIENT DISPOSITION:  PACU - hemodynamically stable.   Delay start of Pharmacological VTE agent (>24hrs) due to surgical blood loss or risk of bleeding: yes

## 2022-06-15 NOTE — Op Note (Signed)
[name], [name] MEDICAL RECORD NO: [id number] ACCOUNT NO: [id number] DATE OF BIRTH: Jan 09, 1951 FACILITY: [facility]ATION: [id number] PHYSICIAN: Aaron Frock, MD  Operative Report   DATE OF PROCEDURE: 06/15/2022  PREOPERATIVE DIAGNOSIS:  Prostatic hypertrophy with refractory urinary retention.  PROCEDURE:  Robotic-assisted laparoscopic simple prostatectomy.  ESTIMATED BLOOD LOSS:  70 mL.  COMPLICATIONS:  None.  SPECIMEN:  Prostate adenoma for permanent pathology.  FINDINGS: 1.  Moderate predominantly bilobar prostatic hypertrophy pre-adenoma resection. 2.  Wide open prostatic fossa from the bladder neck to the membranous urethra, following simple prostatectomy.  DRAINS: 1.  Jackson-Pratt drain to bulb suction. 2.  Foley catheter to straight drain.  ASSISTANT:  Debbrah Alar, PA  INDICATIONS:  The patient is a pleasant 71 year old man with history of lung cancer, status post lung resection, who also has a history of progressive obstructive voiding.  He developed frank urinary retention earlier this year.  He has been catheter  dependent since. He has failed trial of void times several despite maximal medical therapy with 5-alpha reductase inhibitors and alpha blockers.  He underwent urodynamics, which revealed preserved contractility, but high pressure, low flow state  consistent with obstruction.  Cystoscopy and CT scan did reveal significant prostatic hypertrophy with estimated gland size over 100 grams.  Options were discussed including chronic catheters versus outlet procedure and given his prostate size well over  100 grams, the procedure choice being simple prostatectomy is being the most definitive and he wished to proceed with goal of eventually catheter free.  He presents for this today.  Informed consent was obtained and placed in medical record.  PROCEDURE DETAILS:  The patient being [name] verified and procedure being robotic simple prostatectomy was confirmed.   Procedure timeout was performed.  Intravenous antibiotics were administered.  General endotracheal anesthesia induced.  The  patient was placed into a low lithotomy position.  Sterile field was created, prepped and draped the patient's penis, perineum, and proximal thighs using iodine and his infra-xiphoid abdomen using chlorhexidine gluconate after he was clipper shaved.  He  was further fastened to operating table using 3-inch tape over foam padding across supraxiphoid chest.  His arms were tucked to the side using gel rolls.  Foley catheter was placed free to straight drain after prepping.  Next, a high flow, low pressure  pneumoperitoneum was obtained using Veress technique in the supraumbilical midline having passed the aspiration drop test.  An 8 mm robotic camera port was then placed in the location.  Laparoscopic examination of peritoneal cavity revealed no adhesions,  no visceral injury.  He had a paucity of intra-abdominal fat as anticipated.  Additional ports were placed as follows:  Right paramedian 8 mm robotic port, right far lateral 12 mm AirSeal assist port, ____ port, left paramedian 8 mm robotic port, left  far lateral 8 mm robotic port.  Robot was docked and passed the electronic checks.  Attention was directed at development of space of Retzius.  Incision made lateral to the right median umbilical ligament from the midline towards the internal ring  coursing along the iliac vessels towards the right ureter, which was positively identified.  Right vas deferens was encountered, ligated using medial bucket handle and the right bladder wall was separated from the pelvic sidewall towards the area of the  endopelvic fascia on the right side.  A mirror image dissection was performed on the left side.  Anterior attachments were taken down with cautery scissors.  The endopelvic fascia was carefully swept away from  the apical aspect of the prostate just  enough to visualize the dorsal venous  complex, was controlled using green load stapler, which resulted in excellent hemostatic control of the structure and no evidence of membranous urethral injury.  Next, the true bladder neck was identified moving the  Foley catheter back and forth and a small inverted U cystotomy was made for approximately 40% of the bladder circumference in addition approximately 2 cm proximal to the true bladder neck.  This revealed predominantly bilobar prostatic hypertrophy as  anticipated.  The trigonal ridge was easily seen as were the ureteral orifices. Three stay sutures of 0 Vicryl were placed in the right lobe, left lobe, median lobe respectively for adenoma manipulation.  Attention was directed to posterior dissection.   Incision was made, leaving a mucosal lip for later mucosal apposition of the posterior aspect of the median lobe of the prostate and the adenoma plane was entered.  This was carried down distally as far as possible to the area of the membranous urethra  as identified by the prostate, curving anteriorly.  This plane was then swept laterally to the 3 o'clock position and 9 o'clock positions respectively and finally towards the 12 o'clock position on each aside as far distally as possible.  This completely  freed up the very large adenoma specimen, separate final apical attachments.  Final apical dissection was performed in the anterior plane by incising the adenoma at 12 o'clock position in a butterfly type fashion.  This allowed better visualization of  the true apex, which was then separated using cautery scissors, completely freed up the large adenoma, specimen was placed in EndoCatch bag for later retrieval.  Digital rectal exam was performed using indicator glove.  No evidence of rectal violation  was noted.  Additional hemostasis was achieved with point coagulation current in the prostatic fossa.  Hemostasis was quite good.  Ureteral orifices remained visibly patent.  Next, mucosal apposition  was performed using double armed 3-0 V-Loc suture  performing essentially 50% circumference of urethra to bladder mucosa anastomosis from the 3 o'clock and 9 o'clock position.  This resulted in excellent mucosal bridge all the way to the membranous urethra.  This also resulted in excellent hemostasis.   Ureteral orifices remained visibly patent.  The prior cystotomy was then closed using two separate running suture lines of 2-0 V-Loc meeting in the midline.  A new 24-French 3-way Foley catheter was then placed.  30 mL of water in the balloon, irrigated  quantitatively and effectively without leak.  This connected to straight drain and light traction.  The irrigation port plugged.  Next, the specimen was retrieved by extending the previous camera port site inferiorly for a distance of approximately 3 cm,  removing the prostate adenoma, specimen setting aside for permanent pathology.  Extraction site was closed with fascia using figure-of-eight PDS x4 followed by reapproximation of Scarpa's with running Vicryl.  All incision sites were infiltrated with  dilute lipolyzed Marcaine and closed at the level of the skin using subcuticular Monocryl by Dermabond.  Notably, a Jackson-Pratt drain had been placed previously and drain suture was applied.  Procedure was then terminated.  The patient tolerated  procedure well, no immediate perioperative complications.  The patient was taken to postanesthesia care in stable condition.  Plan for observation admission.  Please note, first assistant, Debbrah Alar was crucial for all portions the surgery today.  She provided invaluable retraction, suctioning, vascular stapling, instrument exchange, suture passage, and general first assistance.  NIK D: 06/15/2022 5:18:48 pm T: 06/15/2022 11:03:00 pm  JOB: [id number]/ [id number]

## 2022-06-15 NOTE — Anesthesia Procedure Notes (Signed)
Procedure Name: Intubation Date/Time: 06/15/2022 12:49 PM  Performed by: Eben Burow, CRNAPre-anesthesia Checklist: Patient identified, Emergency Drugs available, Suction available, Patient being monitored and Timeout performed Patient Re-evaluated:Patient Re-evaluated prior to induction Oxygen Delivery Method: Circle system utilized Preoxygenation: Pre-oxygenation with 100% oxygen Induction Type: IV induction Ventilation: Mask ventilation without difficulty Laryngoscope Size: Mac and 4 Grade View: Grade I Tube type: Oral Tube size: 7.5 mm Number of attempts: 1 Airway Equipment and Method: Stylet Placement Confirmation: ETT inserted through vocal cords under direct vision, positive ETCO2 and breath sounds checked- equal and bilateral Secured at: 23 cm Tube secured with: Tape Dental Injury: Teeth and Oropharynx as per pre-operative assessment

## 2022-06-15 NOTE — H&P (Signed)
[name] is an 71 y.o. male.    Chief Complaint: Pre-OP Simple Prostatecotmy  HPI:   1 - Urinary Retention - in/out of retention 2023 despite max medical therapy with alpha blocker + finasteride. UDS with preserved contractilit (PDet 90 with no flow) and cysto w/o stricture. Prostate vol 140gm in 2017 by CT with median lobe. No recent prostate imaging. Cr 0.8.   2 - Bacteruria - expected bacteruria with logn term cathter. CX 2023 klebsiella sesn bactrim.   PMH sig for lung CA (resection, chemo, no recurrence, follows Antionette Poles MD).   Today "[name]" is seen to proceed with simple prostatecomy. Hgb 15.    Past Medical History:  Diagnosis Date   Adenocarcinoma of right lung, stage 1 ([place]) 09/26/2016   Arthritis    Cancer ([place])    pt states possible lung cancer to right lung   Complication of anesthesia    woke up during lung surgery with DR Madolyn Frieze , hypotension  x 2 during surgeries   COPD (chronic obstructive pulmonary disease) ([place])    Dyspnea    with exertion   Dysrhythmia    irregular heart beat per pt not followed by cardiology   Enlarged prostate    Head injury    a. remotely with cranial plate in place.   Lung mass    a. @ [facility] - 2.5 cm right upper lobe hypermetabolic mass highly suggestive of primary lung cancer.    Pneumonia    Pre-diabetes    Tobacco abuse     Past Surgical History:  Procedure Laterality Date   CARPAL TUNNEL RELEASE Right    COLONOSCOPY     ELBOW SURGERY     surgery x2 on right elbow   EYE SURGERY     eye socket fracture, metal plate on left side of face   HAND SURGERY     multiple hand surgeries on right hand   LOBECTOMY Right 07/19/2016   Procedure: LOBECTOMY RIGHT UPPER LOBE WITH WEDGE RESECTION SUPERIOR SEGMENT RIGHT LOWER LOBE WITH PLACEMENT OF ON-Q;  Surgeon: [name], MD;  Location: [place];  Service: Thoracic;  Laterality: Right;   LYMPH NODE DISSECTION Right 07/19/2016   Procedure: LYMPH NODE DISSECTION;  Surgeon: [name], MD;  Location: [place];  Service: Thoracic;  Laterality: Right;   SKIN CANCER EXCISION     SKIN GRAFT     to right hand    VIDEO ASSISTED THORACOSCOPY Right 08/04/2016   Procedure: RIGHT VIDEO ASSISTED THORACOSCOPY WITH SUTURE CLOSURE OF AIR LEAK;  Surgeon: [name], MD;  Location: [place];  Service: Thoracic;  Laterality: Right;   VIDEO ASSISTED THORACOSCOPY (VATS)/WEDGE RESECTION Right 07/19/2016   Procedure: VIDEO ASSISTED THORACOSCOPY (VATS);  Surgeon: [name], MD;  Location: [place];  Service: Thoracic;  Laterality: Right;   VIDEO BRONCHOSCOPY N/A 07/19/2016   Procedure: VIDEO BRONCHOSCOPY;  Surgeon: [name], MD;  Location: [place];  Service: Thoracic;  Laterality: N/A;   VIDEO BRONCHOSCOPY N/A 09/28/2016   Procedure: VIDEO BRONCHOSCOPY, with general anesthesia for removal of interbronchial valves x 3;  Surgeon: [name], MD;  Location: [place];  Service: Thoracic;  Laterality: N/A;   VIDEO BRONCHOSCOPY WITH INSERTION OF INTERBRONCHIAL VALVE (IBV) Right 08/01/2016   Procedure: VIDEO BRONCHOSCOPY WITH INSERTION OF INTERBRONCHIAL VALVE (IBV);  Surgeon: [name], MD;  Location: [place];  Service: Thoracic;  Laterality: Right;   WEDGE RESECTION Right 07/19/2016   Procedure: WEDGE  RESECTION RIGHT UPPER LOBE;  Surgeon: [name], MD;  Location: [facility];  Service: Thoracic;  Laterality: Right;    Family History  Problem Relation Age of Onset   Aneurysm Mother        Brain   Other Mother        Pacemaker   Colon cancer Father    Heart disease Father        CABG in his 71s, passed at 30   Hypercholesterolemia Brother    Lung cancer Paternal Uncle    Social History:  reports that he quit smoking about 5 years ago. His smoking use included cigarettes. He has a 50.00 pack-year smoking history. He has never used smokeless tobacco. He reports that he does not drink alcohol and does not use drugs.  Allergies: No Known Allergies  No medications  prior to admission.    No results found for this or any previous visit (from the past 48 hour(s)). No results found.  Review of Systems  Constitutional:  Negative for chills and fever.  All other systems reviewed and are negative.   There were no vitals taken for this visit. Physical Exam Vitals reviewed.  HENT:     Head: Normocephalic.     Nose: Nose normal.  Cardiovascular:     Rate and Rhythm: Normal rate.  Abdominal:     General: Abdomen is flat.  Genitourinary:    Comments: Foley in place with non-foul urine Musculoskeletal:        General: Normal range of motion.     Cervical back: Normal range of motion.  Neurological:     General: No focal deficit present.     Mental Status: He is alert.      Assessment/Plan  Proceed as planned with simple prostatecotmy. Risks, benefits, alternatives, expected peri-op course discussed previously and reiteratd today. He understands that his pulm comorbiidty increases risk or peri-op complications indling PNA and prolonged intubation.   Alexis Frock, MD 06/15/2022, 7:42 AM

## 2022-06-15 NOTE — Discharge Instructions (Signed)

## 2022-06-15 NOTE — Transfer of Care (Signed)
Immediate Anesthesia Transfer of Care Note  Patient: [name]  Procedure(s) Performed: XI ROBOTIC ASSISTED SIMPLE PROSTATECTOMY (Abdomen)  Patient Location: PACU  Anesthesia Type:General  Level of Consciousness: awake, drowsy, and patient cooperative  Airway & Oxygen Therapy: Patient Spontanous Breathing and Patient connected to face mask oxygen  Post-op Assessment: Report given to RN, Post -op Vital signs reviewed and stable, and Patient moving all extremities X 4  Post vital signs: Reviewed and stable  Last Vitals:  Vitals Value Taken Time  BP 122/80 1530  Temp    Pulse 68   Resp 21   SpO2 100     Last Pain:  Vitals:   06/15/22 0949  TempSrc:   PainSc: 0-No pain         Complications: No notable events documented.

## 2022-06-16 ENCOUNTER — Encounter (HOSPITAL_COMMUNITY): Payer: Self-pay | Admitting: Urology

## 2022-06-16 DIAGNOSIS — N401 Enlarged prostate with lower urinary tract symptoms: Secondary | ICD-10-CM | POA: Diagnosis not present

## 2022-06-16 LAB — BASIC METABOLIC PANEL
Anion gap: 7 (ref 5–15)
BUN: 12 mg/dL (ref 8–23)
CO2: 24 mmol/L (ref 22–32)
Calcium: 8.5 mg/dL — ABNORMAL LOW (ref 8.9–10.3)
Chloride: 107 mmol/L (ref 98–111)
Creatinine, Ser: 0.89 mg/dL (ref 0.61–1.24)
GFR, Estimated: >60 mL/min (ref >60)
Glucose, Bld: 108 mg/dL — ABNORMAL HIGH (ref 70–99)
Potassium: 4.4 mmol/L (ref 3.5–5.1)
Sodium: 138 mmol/L (ref 135–145)

## 2022-06-16 LAB — HEMOGLOBIN AND HEMATOCRIT, BLOOD
HCT: 39.6 % (ref 39.0–52.0)
Hemoglobin: 12.5 g/dL — ABNORMAL LOW (ref 13.0–17.0)

## 2022-06-16 MED ORDER — ALBUTEROL SULFATE (2.5 MG/3ML) 0.083% IN NEBU
2.5000 mg | INHALATION_SOLUTION | RESPIRATORY_TRACT | Status: DC | PRN
  Administered 2022-06-17: 2.5 mg via RESPIRATORY_TRACT
  Filled 2022-06-16 (×3): qty 3

## 2022-06-16 MED ORDER — MOMETASONE FURO-FORMOTEROL FUM 200-5 MCG/ACT IN AERO
2.0000 | INHALATION_SPRAY | Freq: Two times a day (BID) | RESPIRATORY_TRACT | Status: DC
  Administered 2022-06-16 – 2022-06-17 (×3): 2 via RESPIRATORY_TRACT
  Filled 2022-06-16: qty 8.8

## 2022-06-16 MED ORDER — MELATONIN 5 MG PO TABS
10.0000 mg | ORAL_TABLET | Freq: Every day | ORAL | Status: DC
  Administered 2022-06-16: 10 mg via ORAL
  Filled 2022-06-16: qty 2

## 2022-06-16 MED ORDER — PRAVASTATIN SODIUM 40 MG PO TABS
80.0000 mg | ORAL_TABLET | Freq: Every day | ORAL | Status: DC
  Administered 2022-06-16: 80 mg via ORAL
  Filled 2022-06-16: qty 2

## 2022-06-16 MED ORDER — CHLORHEXIDINE GLUCONATE CLOTH 2 % EX PADS
6.0000 | MEDICATED_PAD | Freq: Every day | CUTANEOUS | Status: DC
  Administered 2022-06-16: 6 via TOPICAL

## 2022-06-16 NOTE — Plan of Care (Signed)
  Problem: Pain Management: Goal: General experience of comfort will improve Outcome: Progressing   Problem: Clinical Measurements: Goal: Diagnostic test results will improve Outcome: Progressing Goal: Respiratory complications will improve Outcome: Progressing   Problem: Activity: Goal: Risk for activity intolerance will decrease Outcome: Progressing   Problem: Nutrition: Goal: Adequate nutrition will be maintained Outcome: Progressing

## 2022-06-16 NOTE — Progress Notes (Addendum)
1 Day Post-Op Subjective: The patient is doing well.  No nausea or vomiting. Persistent abdominal/incisional pain despite PRNs. Placed on O2 overnight for sats ~90%. No increased WOB or SOB.  Objective: Vital signs in last 24 hours: Temp:  [98 F (36.7 C)-99.7 F (37.6 C)] 98.4 F (36.9 C) (12/14 0535) Pulse Rate:  [55-105] 65 (12/14 0535) Resp:  [12-20] 18 (12/14 0535) BP: (101-142)/(53-85) 116/62 (12/14 0535) SpO2:  [90 %-100 %] 92 % (12/14 1143)  Intake/Output from previous day: 12/13 0701 - 12/14 0700 In: 2974.3 [P.O.:480; I.V.:2394.3; IV Piggyback:100] Out: 1190 [Urine:1050; Drains:70; Blood:70] Intake/Output this shift: No intake/output data recorded.  Physical Exam:  General: Alert and oriented. CV: RRR Lungs: Clear bilaterally. On RA, normal work of breathing GI: Soft, Nondistended. Incisions: Clean, dry, and intact Urine: Light pink with bloody sediment. Extremities: Nontender, no erythema, no edema.  Lab Results: Recent Labs    06/15/22 1646 06/16/22 0422  HGB 13.6 12.5*  HCT 41.8 39.6      Assessment/Plan: BPH with urinary retention - Now POD#1 s/p robotic simple prostatectomy. Progressing well post-operatively. Urine is clearing. JP drain with minimal serosanguineous output. Will plan to continue foley catheter to drainage and anticipate JP drain removal prior to discharge if output remains low  Encouraged patient to continue ambulating and use IS when in bed to reduce risk of post-operative pulmonary complications given his pulmonary issues. Will continue with current pain regimen.    LOS: 0 days   Lamar Laundry 06/16/2022, 11:58 AM    I have seen and examined to patient and agree with Dr. Arita Miss plan.  Briefly,  S: POD 1 s/p simple prostatectomy. No acue events overngiht. Does reqeust PRN inhailer (uses PRN at home).  O: NAD, AOx3, family at bedside Non-labored breathign on minimal [place] O2 Baseline barrel chest Recent port sites  c/d/I. JP with scant non-foul serosangiunous outptu Foley with medium yellow urine (clearing) No c/c/e  Hgb 12's  A/P: Doing well POD 1. Goals for DC discussed. He is close. [place] AM tomorrow.

## 2022-06-17 DIAGNOSIS — N401 Enlarged prostate with lower urinary tract symptoms: Secondary | ICD-10-CM | POA: Diagnosis not present

## 2022-06-17 LAB — BASIC METABOLIC PANEL
Anion gap: 12 (ref 5–15)
BUN: 11 mg/dL (ref 8–23)
CO2: 25 mmol/L (ref 22–32)
Calcium: 8.9 mg/dL (ref 8.9–10.3)
Chloride: 100 mmol/L (ref 98–111)
Creatinine, Ser: 0.83 mg/dL (ref 0.61–1.24)
GFR, Estimated: >60 mL/min (ref >60)
Glucose, Bld: 100 mg/dL — ABNORMAL HIGH (ref 70–99)
Potassium: 3.9 mmol/L (ref 3.5–5.1)
Sodium: 137 mmol/L (ref 135–145)

## 2022-06-17 LAB — HEMOGLOBIN AND HEMATOCRIT, BLOOD
HCT: 43.6 % (ref 39.0–52.0)
Hemoglobin: 13.9 g/dL (ref 13.0–17.0)

## 2022-06-17 LAB — SURGICAL PATHOLOGY

## 2022-06-17 MED ORDER — OXYCODONE HCL 5 MG PO TABS: 5.0000 mg | ORAL_TABLET | Freq: Four times a day (QID) | ORAL | 0 refills | Status: AC | PRN

## 2022-06-17 MED ORDER — SULFAMETHOXAZOLE-TRIMETHOPRIM 800-160 MG PO TABS: 1.0000 | ORAL_TABLET | Freq: Two times a day (BID) | ORAL | 0 refills | Status: AC

## 2022-06-17 MED ORDER — SENNOSIDES-DOCUSATE SODIUM 8.6-50 MG PO TABS: 1.0000 | ORAL_TABLET | Freq: Two times a day (BID) | ORAL | 0 refills | Status: AC

## 2022-06-17 NOTE — Progress Notes (Addendum)
Mobility Specialist - Progress Note   06/17/22 1034  Mobility  Activity Ambulated with assistance in hallway  Level of Assistance Standby assist, set-up cues, supervision of patient - no hands on  Assistive Device Other (Comment) (IV Pole ; Hallway Rails)  Distance Ambulated (ft) 80 ft  Range of Motion/Exercises Active  Activity Response Tolerated well  Mobility Referral Yes  $Mobility charge 1 Mobility   Pt was found on recliner chair and agreeable to ambulate. Pt stated that it was hard to breath during ambulation and that he had a 9/10 pain level while holding his abdomin. At EOS pt returned to bed with necessities in reach and family in room. RN notified of session.  Ferd Hibbs Mobility Specialist

## 2022-06-17 NOTE — Plan of Care (Signed)
  Problem: Skin Integrity: Goal: Demonstration of wound healing without infection will improve Outcome: Progressing   Problem: Clinical Measurements: Goal: Will remain free from infection Outcome: Progressing   Problem: Clinical Measurements: Goal: Diagnostic test results will improve Outcome: Progressing   Problem: Nutrition: Goal: Adequate nutrition will be maintained Outcome: Progressing   Problem: Coping: Goal: Level of anxiety will decrease Outcome: Progressing

## 2022-06-17 NOTE — Anesthesia Postprocedure Evaluation (Signed)
Anesthesia Post Note  Patient: [name]  Procedure(s) Performed: XI ROBOTIC ASSISTED SIMPLE PROSTATECTOMY (Abdomen)     Patient location during evaluation: PACU Anesthesia Type: General Level of consciousness: awake and alert Pain management: pain level controlled Vital Signs Assessment: post-procedure vital signs reviewed and stable Respiratory status: spontaneous breathing, nonlabored ventilation, respiratory function stable and patient connected to nasal cannula oxygen Cardiovascular status: blood pressure returned to baseline and stable Postop Assessment: no apparent nausea or vomiting Anesthetic complications: no   No notable events documented.  Last Vitals:  Vitals:   06/16/22 2151 06/17/22 0532  BP: 110/72 119/89  Pulse: 67 91  Resp: 17 18  Temp: 37.1 C 36.8 C  SpO2: 91% 93%    Last Pain:  Vitals:   06/17/22 0856  TempSrc:   PainSc: [place]

## 2022-06-17 NOTE — Discharge Summary (Signed)
Physician Discharge Summary  Patient ID: [name] MRN: [id number] DOB/AGE: [id number] 71 y.o.  Admit date: 06/15/2022 Discharge date: 06/17/2022  Admission Diagnoses: Prostate Hypertrophy with Refractory Urinary Retention  Discharge Diagnoses:  Principal Problem:   BPH with obstruction/lower urinary tract symptoms   Discharged Condition: good  Hospital Course: Pt underwent robotic simple prostatectomy on 06/15/22, the day of admission, without acute complicateion. Admitted to 4th floor Urology service post-op. By the afternoot of POD 2, the day of discharge, he is ambulatory, pain controlled on PO meds, and felt to be adequate for discharge. JP removed as output scant. Hgb 13, Cr <1, surgical path pending at discharge.   Consults: None  Significant Diagnostic Studies: labs: as per above  Treatments: surgery: as per above  Discharge Exam: Blood pressure 119/89, pulse 91, temperature 98.3 F (36.8 C), temperature source Oral, resp. rate 18, height 6' (1.829 m), weight 63.5 kg, SpO2 93 %.  NAD, AOx3, family at bedside Non-labored breathigng on RA. Baseline barrel chest Recent port sites c/d/I. JP site c/d/I with dry dressing.  Foley with medium yellow urine (clearing) No c/c/e  Disposition: HOME   Allergies as of 06/17/2022   No Known Allergies      Medication List     STOP taking these medications    aspirin EC 81 MG tablet   silodosin 8 MG Caps capsule Commonly known as: RAPAFLO   tamsulosin 0.4 MG Caps capsule Commonly known as: FLOMAX   traMADol 50 MG tablet Commonly known as: ULTRAM       TAKE these medications    acetaminophen 650 MG CR tablet Commonly known as: TYLENOL Take 650 mg by mouth daily.   budesonide-formoterol 160-4.5 MCG/ACT inhaler Commonly known as: SYMBICORT Inhale 2 puffs into the lungs daily.   finasteride 5 MG tablet Commonly known as: PROSCAR Take 5 mg by mouth daily.   melatonin 5 MG Tabs Take 10 mg by mouth at  bedtime.   metFORMIN 500 MG tablet Commonly known as: GLUCOPHAGE Take 500 mg by mouth 2 (two) times daily.   oxyCODONE 5 MG immediate release tablet Commonly known as: Roxicodone Take 1 tablet (5 mg total) by mouth every 6 (six) hours as needed for moderate pain or severe pain (post-operatively).   pravastatin 80 MG tablet Commonly known as: PRAVACHOL Take 80 mg by mouth at bedtime.   senna-docusate 8.6-50 MG tablet Commonly known as: Senokot-S Take 1 tablet by mouth 2 (two) times daily. While taking strong pain meds to prevent constipation   Spacer/Aero Chamber Mouthpiece Misc 1 Device by Does not apply route 2 (two) times daily.   sulfamethoxazole-trimethoprim 800-160 MG tablet Commonly known as: BACTRIM DS Take 1 tablet by mouth 2 (two) times daily. Pt to start day prior to follow up appt in the office for foley removal What changed: additional instructions   sulfamethoxazole-trimethoprim 800-160 MG tablet Commonly known as: BACTRIM DS Take 1 tablet by mouth 2 (two) times daily. X 3 days. Begin day before next Urology appointment. What changed: You were already taking a medication with the same name, and this prescription was added. Make sure you understand how and when to take each.        Follow-up Information     Alexis Frock, MD Follow up on 06/28/2022.   Specialty: Urology Why: at 9 AM for MD visit and office catheter removal. Contact information: [place] [address] [phone]  Signed: Alexis Frock 06/17/2022, 12:08 PM

## 2022-10-07 ENCOUNTER — Other Ambulatory Visit: Payer: Self-pay

## 2022-10-07 ENCOUNTER — Ambulatory Visit (HOSPITAL_COMMUNITY)
Admission: RE | Admit: 2022-10-07 | Discharge: 2022-10-07 | Disposition: A | Discharge status: Home / Self Care | Payer: PPO | Source: Ambulatory Visit | Attending: Internal Medicine | Admitting: Internal Medicine

## 2022-10-07 ENCOUNTER — Other Ambulatory Visit: Payer: PPO

## 2022-10-07 ENCOUNTER — Inpatient Hospital Stay: Payer: PPO | Attending: Internal Medicine

## 2022-10-07 DIAGNOSIS — C3490 Malignant neoplasm of unspecified part of unspecified bronchus or lung: Secondary | ICD-10-CM | POA: Diagnosis present

## 2022-10-07 DIAGNOSIS — Z85118 Personal history of other malignant neoplasm of bronchus and lung: Secondary | ICD-10-CM | POA: Insufficient documentation

## 2022-10-07 LAB — CBC WITH DIFFERENTIAL (CANCER CENTER ONLY)
Abs Immature Granulocytes: 0.03 10*3/uL (ref 0.00–0.07)
Basophils Absolute: 0.1 10*3/uL (ref 0.0–0.1)
Basophils Relative: 1 %
Eosinophils Absolute: 0.1 10*3/uL (ref 0.0–0.5)
Eosinophils Relative: 1 %
HCT: 44.4 % (ref 39.0–52.0)
Hemoglobin: 15.1 g/dL (ref 13.0–17.0)
Immature Granulocytes: 0 %
Lymphocytes Relative: 18 %
Lymphs Abs: 1.6 10*3/uL (ref 0.7–4.0)
MCH: 31.7 pg (ref 26.0–34.0)
MCHC: 34 g/dL (ref 30.0–36.0)
MCV: 93.1 fL (ref 80.0–100.0)
Monocytes Absolute: 0.6 10*3/uL (ref 0.1–1.0)
Monocytes Relative: 6 %
Neutro Abs: 6.5 10*3/uL (ref 1.7–7.7)
Neutrophils Relative %: 74 %
Platelet Count: 193 10*3/uL (ref 150–400)
RBC: 4.77 MIL/uL (ref 4.22–5.81)
RDW: 14.6 % (ref 11.5–15.5)
WBC Count: 8.8 10*3/uL (ref 4.0–10.5)
nRBC: 0 % (ref 0.0–0.2)

## 2022-10-07 LAB — CMP (CANCER CENTER ONLY)
ALT: 10 U/L (ref 0–44)
AST: 13 U/L — ABNORMAL LOW (ref 15–41)
Albumin: 3.9 g/dL (ref 3.5–5.0)
Alkaline Phosphatase: 70 U/L (ref 38–126)
Anion gap: 5 (ref 5–15)
BUN: 11 mg/dL (ref 8–23)
CO2: 31 mmol/L (ref 22–32)
Calcium: 9.6 mg/dL (ref 8.9–10.3)
Chloride: 105 mmol/L (ref 98–111)
Creatinine: 0.94 mg/dL (ref 0.61–1.24)
GFR, Estimated: >60 mL/min (ref >60)
Glucose, Bld: 146 mg/dL — ABNORMAL HIGH (ref 70–99)
Potassium: 4.2 mmol/L (ref 3.5–5.1)
Sodium: 141 mmol/L (ref 135–145)
Total Bilirubin: 0.5 mg/dL (ref 0.3–1.2)
Total Protein: 6.5 g/dL (ref 6.5–8.1)

## 2022-10-07 MED ORDER — IOHEXOL 300 MG/ML  SOLN
75.0000 mL | Freq: Once | INTRAMUSCULAR | Status: AC | PRN
  Administered 2022-10-07: 75 mL via INTRAVENOUS

## 2022-10-10 ENCOUNTER — Other Ambulatory Visit (HOSPITAL_COMMUNITY): Payer: PPO

## 2022-10-10 ENCOUNTER — Other Ambulatory Visit: Payer: PPO

## 2022-10-12 ENCOUNTER — Inpatient Hospital Stay: Payer: PPO | Admitting: Internal Medicine
